# Patient Record
Sex: Female | Born: 1940
Health system: Southern US, Community
[De-identification: ages and names within clinical notes are randomized; demographics above are authoritative.]

## PROBLEM LIST (undated history)

## (undated) DIAGNOSIS — R112 Nausea with vomiting, unspecified: Secondary | ICD-10-CM

## (undated) DIAGNOSIS — H409 Unspecified glaucoma: Secondary | ICD-10-CM

## (undated) DIAGNOSIS — C801 Malignant (primary) neoplasm, unspecified: Secondary | ICD-10-CM

## (undated) DIAGNOSIS — G459 Transient cerebral ischemic attack, unspecified: Secondary | ICD-10-CM

## (undated) DIAGNOSIS — I4811 Longstanding persistent atrial fibrillation: Secondary | ICD-10-CM

## (undated) DIAGNOSIS — Z923 Personal history of irradiation: Secondary | ICD-10-CM

## (undated) DIAGNOSIS — Z9889 Other specified postprocedural states: Secondary | ICD-10-CM

## (undated) DIAGNOSIS — E785 Hyperlipidemia, unspecified: Secondary | ICD-10-CM

## (undated) DIAGNOSIS — I4819 Other persistent atrial fibrillation: Secondary | ICD-10-CM

## (undated) DIAGNOSIS — H269 Unspecified cataract: Secondary | ICD-10-CM

## (undated) HISTORY — DX: Longstanding persistent atrial fibrillation: I48.11

## (undated) HISTORY — PX: BILATERAL SALPINGOOPHORECTOMY: SHX1223

## (undated) HISTORY — PX: BREAST BIOPSY: SHX20

## (undated) HISTORY — DX: Unspecified glaucoma: H40.9

## (undated) HISTORY — DX: Hyperlipidemia, unspecified: E78.5

## (undated) HISTORY — PX: COLONOSCOPY: SHX174

## (undated) HISTORY — PX: ABDOMINAL HYSTERECTOMY: SHX81

## (undated) HISTORY — DX: Unspecified cataract: H26.9

## (undated) HISTORY — DX: Transient cerebral ischemic attack, unspecified: G45.9

---

## 1998-06-03 ENCOUNTER — Ambulatory Visit (HOSPITAL_COMMUNITY): Admission: RE | Admit: 1998-06-03 | Discharge: 1998-06-03 | Payer: Self-pay | Admitting: Obstetrics and Gynecology

## 1999-02-03 ENCOUNTER — Ambulatory Visit (HOSPITAL_COMMUNITY): Admission: RE | Admit: 1999-02-03 | Discharge: 1999-02-03 | Payer: Self-pay | Admitting: Orthopedic Surgery

## 1999-02-03 ENCOUNTER — Encounter: Payer: Self-pay | Admitting: Orthopedic Surgery

## 2000-01-05 ENCOUNTER — Encounter: Payer: Self-pay | Admitting: Obstetrics and Gynecology

## 2000-01-05 ENCOUNTER — Ambulatory Visit (HOSPITAL_COMMUNITY): Admission: RE | Admit: 2000-01-05 | Discharge: 2000-01-05 | Payer: Self-pay | Admitting: Obstetrics and Gynecology

## 2001-01-18 ENCOUNTER — Encounter: Payer: Self-pay | Admitting: Family Medicine

## 2001-01-18 ENCOUNTER — Ambulatory Visit (HOSPITAL_COMMUNITY): Admission: RE | Admit: 2001-01-18 | Discharge: 2001-01-18 | Payer: Self-pay | Admitting: Family Medicine

## 2004-07-15 ENCOUNTER — Encounter: Admission: RE | Admit: 2004-07-15 | Discharge: 2004-07-15 | Payer: Self-pay | Admitting: Family Medicine

## 2005-08-24 ENCOUNTER — Ambulatory Visit: Payer: Self-pay | Admitting: Gastroenterology

## 2005-09-29 ENCOUNTER — Ambulatory Visit: Payer: Self-pay | Admitting: Gastroenterology

## 2008-02-06 ENCOUNTER — Encounter: Admission: RE | Admit: 2008-02-06 | Discharge: 2008-02-06 | Payer: Self-pay | Admitting: Internal Medicine

## 2008-04-20 ENCOUNTER — Emergency Department (HOSPITAL_COMMUNITY): Admission: EM | Admit: 2008-04-20 | Discharge: 2008-04-20 | Payer: Self-pay | Admitting: Emergency Medicine

## 2008-11-25 ENCOUNTER — Encounter: Admission: RE | Admit: 2008-11-25 | Discharge: 2008-11-25 | Payer: Self-pay | Admitting: Internal Medicine

## 2011-06-18 LAB — GLUCOSE, CAPILLARY

## 2011-11-29 ENCOUNTER — Telehealth: Payer: Self-pay | Admitting: Gastroenterology

## 2011-11-29 NOTE — Telephone Encounter (Signed)
Pt states she has had some diarrhea and that she at times has urgency with this and does not quite make it to the restroom. Pt is requesting to be seen sooner than appt with Dr. Arlyce Dice. Pt scheduled to see Willette Cluster NP tomorrow 11/30/11@3 :30pm. Pt aware of appt date and time.

## 2011-11-30 ENCOUNTER — Encounter: Payer: Self-pay | Admitting: Nurse Practitioner

## 2011-11-30 ENCOUNTER — Ambulatory Visit (INDEPENDENT_AMBULATORY_CARE_PROVIDER_SITE_OTHER): Payer: Medicare Other | Admitting: Nurse Practitioner

## 2011-11-30 VITALS — BP 120/72 | HR 60 | Ht 65.0 in | Wt 163.4 lb

## 2011-11-30 DIAGNOSIS — K648 Other hemorrhoids: Secondary | ICD-10-CM | POA: Insufficient documentation

## 2011-11-30 DIAGNOSIS — K625 Hemorrhage of anus and rectum: Secondary | ICD-10-CM

## 2011-11-30 HISTORY — DX: Hemorrhage of anus and rectum: K62.5

## 2011-11-30 HISTORY — DX: Other hemorrhoids: K64.8

## 2011-11-30 MED ORDER — HYDROCORTISONE ACETATE 25 MG RE SUPP: 25.0000 mg | Freq: Two times a day (BID) | RECTAL | Status: AC

## 2011-11-30 NOTE — Patient Instructions (Signed)
We sent the prescription to [facility] at [place]. Make an appointment with Dr. Arlyce Dice for 2 months out.  We have given you a fecal incontinence handout.

## 2011-11-30 NOTE — Progress Notes (Signed)
11/30/2011 Dorothy Herring [id number] 1940/12/24   HISTORY OF PRESENT ILLNESS: Patient is a 71 year old female who had a colonoscopy for hematochezia by Dr. Arlyce Dice in 2007. Findings included internal hemorrhoids only. Patient had a TIA last April and was started on Plavix. She comes in today for evaluation of occasional painless rectal bleeding which has been going on for about three months. She denies constipation. She does complain of fecal seepage over the last few months. She soils her underwear several times a day.    Past Medical History  Diagnosis Date  . Diabetes mellitus   . Hyperlipidemia   . TIA (transient ischemic attack)    Past Surgical History  Procedure Date  . Abdominal hysterectomy   . Breast biopsy     reports that she has never smoked. She has never used smokeless tobacco. She reports that she does not drink alcohol or use illicit drugs. family history includes Lung cancer in her father.  There is no history of Colon cancer. No Known Allergies    Outpatient Encounter Prescriptions as of 11/30/2011  Medication Sig Dispense Refill  . clopidogrel (PLAVIX) 75 MG tablet Take 75 mg by mouth daily.      . insulin aspart (NOVOLOG) 100 UNIT/ML injection Inject 18 Units into the skin daily.      . rosuvastatin (CRESTOR) 20 MG tablet Take 20 mg by mouth daily.      . sitaGLIPtin (JANUVIA) 25 MG tablet Take 25 mg by mouth daily.       REVIEW OF SYSTEMS  : All other systems reviewed and negative except where noted in the History of Present Illness.   PHYSICAL EXAM: BP 120/72  Pulse 60  Ht 5\' 5"  (1.651 m)  Wt 163 lb 6.4 oz (74.118 kg)  BMI 27.19 kg/m2 General: Well developed white female in no acute distress Head: Normocephalic and atraumatic Eyes:  sclerae anicteric,conjunctive pink. Ears: Normal auditory acuity Neck: Supple, no masses.  Lungs: Clear throughout to auscultation Heart: Regular rate and rhythm Abdomen: Soft, non distended, nontender. No masses or  hepatomegaly noted. Normal Bowel sounds Rectal: stool light brown, heme negative. No impaction. Decreased sphincter tone. Internal hemorrhoids seen on anoscopy Musculoskeletal: Symmetrical with no gross deformities  Skin: No lesions on visible extremities Extremities: No edema or deformities noted Neurological: Alert oriented, grossly nonfocal Cervical Nodes:  No significant cervical adenopathy Psychological:  Alert and cooperative. Normal mood and affect  ASSESSMENT AND PLAN;  71 year old female with three month history of occasional rectal bleeding with bowel movements in setting of chronic Plavix. Suspect bleeding from internal hemorrhoids seen on anoscopy today. Will treat her with Anusol-HC suppositories. Followup appointment with Dr. Arlyce Dice in approximately 2 months time. Patient is up-to-date on her colon cancer screening, last colonoscopy was 2007. If bleeding persists patient may need internal hemorrhoid banding but that can be decided at her next visit.

## 2011-12-02 ENCOUNTER — Encounter: Payer: Self-pay | Admitting: Nurse Practitioner

## 2011-12-02 NOTE — Progress Notes (Signed)
i agree with the plan outlined in this note 

## 2011-12-15 ENCOUNTER — Ambulatory Visit: Payer: Self-pay | Admitting: Gastroenterology

## 2012-06-07 ENCOUNTER — Other Ambulatory Visit: Payer: Self-pay | Admitting: Radiology

## 2013-08-01 ENCOUNTER — Ambulatory Visit (INDEPENDENT_AMBULATORY_CARE_PROVIDER_SITE_OTHER): Payer: Medicare Other | Admitting: Podiatry

## 2013-08-01 DIAGNOSIS — M7750 Other enthesopathy of unspecified foot: Secondary | ICD-10-CM

## 2013-08-01 MED ORDER — TRIAMCINOLONE ACETONIDE 10 MG/ML IJ SUSP
5.0000 mg | Freq: Once | INTRAMUSCULAR | Status: AC
  Administered 2013-08-01: 5 mg via INTRA_ARTICULAR

## 2013-08-01 NOTE — Progress Notes (Signed)
Subjective:     Patient ID: Dorothy Herring, female   DOB: [birth year], 72 y.o.   MRN: [id number]  Foot Pain   patient states I am continuing to have pain in his right foot on the side. States she has seen Dr. Jerl Santos and it has continued to hurt her.   Review of Systems     Objective:   Physical Exam  Nursing note and vitals reviewed. Constitutional: She is oriented to person, place, and time.  Cardiovascular: Intact distal pulses.   Musculoskeletal: Normal range of motion.  Neurological: She is oriented to person, place, and time.  Skin: Skin is warm.   patient is found to have pain in the peroneal brevis insertion right foot with no indication of muscle loss or reduced function     Assessment:     Continued peroneal brevis tendinitis right with possibility for bone injury    Plan:     H&P performed and I've recommended orthotics to try to lift the lateral side of the foot. Scanned for customized orthotics today and I did in order to give short-term relief injected the area with 3 mg Kenalog 5 mg Xylocaine Marcaine mixture reappoint when orthotics returned

## 2013-08-23 ENCOUNTER — Ambulatory Visit: Payer: 59 | Admitting: Sports Medicine

## 2015-10-28 ENCOUNTER — Encounter: Payer: Self-pay | Admitting: Gastroenterology

## 2016-01-06 ENCOUNTER — Encounter: Payer: Self-pay | Admitting: Internal Medicine

## 2016-07-06 ENCOUNTER — Other Ambulatory Visit: Payer: Self-pay | Admitting: Internal Medicine

## 2016-07-06 DIAGNOSIS — R011 Cardiac murmur, unspecified: Secondary | ICD-10-CM

## 2016-07-22 ENCOUNTER — Other Ambulatory Visit (HOSPITAL_COMMUNITY): Payer: Medicare Other

## 2016-07-29 ENCOUNTER — Other Ambulatory Visit (HOSPITAL_COMMUNITY): Payer: Medicare Other

## 2016-08-10 ENCOUNTER — Other Ambulatory Visit (HOSPITAL_COMMUNITY): Payer: Medicare Other

## 2016-09-23 DIAGNOSIS — E119 Type 2 diabetes mellitus without complications: Secondary | ICD-10-CM | POA: Diagnosis not present

## 2016-09-23 DIAGNOSIS — H43811 Vitreous degeneration, right eye: Secondary | ICD-10-CM | POA: Diagnosis not present

## 2016-09-23 DIAGNOSIS — H26491 Other secondary cataract, right eye: Secondary | ICD-10-CM | POA: Diagnosis not present

## 2016-09-23 DIAGNOSIS — H401131 Primary open-angle glaucoma, bilateral, mild stage: Secondary | ICD-10-CM | POA: Diagnosis not present

## 2016-11-12 DIAGNOSIS — Z460 Encounter for fitting and adjustment of spectacles and contact lenses: Secondary | ICD-10-CM | POA: Diagnosis not present

## 2016-11-24 ENCOUNTER — Encounter: Payer: Self-pay | Admitting: *Deleted

## 2016-11-29 DIAGNOSIS — R69 Illness, unspecified: Secondary | ICD-10-CM | POA: Diagnosis not present

## 2016-12-07 DIAGNOSIS — E784 Other hyperlipidemia: Secondary | ICD-10-CM | POA: Diagnosis not present

## 2016-12-07 DIAGNOSIS — R8299 Other abnormal findings in urine: Secondary | ICD-10-CM | POA: Diagnosis not present

## 2016-12-07 DIAGNOSIS — I10 Essential (primary) hypertension: Secondary | ICD-10-CM | POA: Diagnosis not present

## 2016-12-07 DIAGNOSIS — E1165 Type 2 diabetes mellitus with hyperglycemia: Secondary | ICD-10-CM | POA: Diagnosis not present

## 2016-12-07 DIAGNOSIS — M859 Disorder of bone density and structure, unspecified: Secondary | ICD-10-CM | POA: Diagnosis not present

## 2016-12-14 DIAGNOSIS — Z794 Long term (current) use of insulin: Secondary | ICD-10-CM | POA: Diagnosis not present

## 2016-12-14 DIAGNOSIS — E1140 Type 2 diabetes mellitus with diabetic neuropathy, unspecified: Secondary | ICD-10-CM | POA: Diagnosis not present

## 2016-12-14 DIAGNOSIS — E1165 Type 2 diabetes mellitus with hyperglycemia: Secondary | ICD-10-CM | POA: Diagnosis not present

## 2016-12-14 DIAGNOSIS — R69 Illness, unspecified: Secondary | ICD-10-CM | POA: Diagnosis not present

## 2016-12-14 DIAGNOSIS — M859 Disorder of bone density and structure, unspecified: Secondary | ICD-10-CM | POA: Diagnosis not present

## 2016-12-14 DIAGNOSIS — R011 Cardiac murmur, unspecified: Secondary | ICD-10-CM | POA: Diagnosis not present

## 2016-12-14 DIAGNOSIS — E784 Other hyperlipidemia: Secondary | ICD-10-CM | POA: Diagnosis not present

## 2016-12-14 DIAGNOSIS — R946 Abnormal results of thyroid function studies: Secondary | ICD-10-CM | POA: Diagnosis not present

## 2016-12-14 DIAGNOSIS — Z0000 Encounter for general adult medical examination without abnormal findings: Secondary | ICD-10-CM | POA: Diagnosis not present

## 2016-12-14 DIAGNOSIS — I10 Essential (primary) hypertension: Secondary | ICD-10-CM | POA: Diagnosis not present

## 2016-12-28 DIAGNOSIS — Z0389 Encounter for observation for other suspected diseases and conditions ruled out: Secondary | ICD-10-CM | POA: Diagnosis not present

## 2017-02-04 DIAGNOSIS — J209 Acute bronchitis, unspecified: Secondary | ICD-10-CM | POA: Diagnosis not present

## 2017-02-04 DIAGNOSIS — Z6826 Body mass index (BMI) 26.0-26.9, adult: Secondary | ICD-10-CM | POA: Diagnosis not present

## 2017-02-24 DIAGNOSIS — L821 Other seborrheic keratosis: Secondary | ICD-10-CM | POA: Diagnosis not present

## 2017-02-24 DIAGNOSIS — Z85828 Personal history of other malignant neoplasm of skin: Secondary | ICD-10-CM | POA: Diagnosis not present

## 2017-02-24 DIAGNOSIS — D1801 Hemangioma of skin and subcutaneous tissue: Secondary | ICD-10-CM | POA: Diagnosis not present

## 2017-02-24 DIAGNOSIS — L814 Other melanin hyperpigmentation: Secondary | ICD-10-CM | POA: Diagnosis not present

## 2017-02-24 DIAGNOSIS — H0014 Chalazion left upper eyelid: Secondary | ICD-10-CM | POA: Diagnosis not present

## 2017-03-09 DIAGNOSIS — H401131 Primary open-angle glaucoma, bilateral, mild stage: Secondary | ICD-10-CM | POA: Diagnosis not present

## 2017-04-08 DIAGNOSIS — I10 Essential (primary) hypertension: Secondary | ICD-10-CM | POA: Diagnosis not present

## 2017-04-08 DIAGNOSIS — E1165 Type 2 diabetes mellitus with hyperglycemia: Secondary | ICD-10-CM | POA: Diagnosis not present

## 2017-04-08 DIAGNOSIS — G6289 Other specified polyneuropathies: Secondary | ICD-10-CM | POA: Diagnosis not present

## 2017-04-08 DIAGNOSIS — Z794 Long term (current) use of insulin: Secondary | ICD-10-CM | POA: Diagnosis not present

## 2017-04-08 DIAGNOSIS — E559 Vitamin D deficiency, unspecified: Secondary | ICD-10-CM | POA: Diagnosis not present

## 2017-04-08 DIAGNOSIS — Z6827 Body mass index (BMI) 27.0-27.9, adult: Secondary | ICD-10-CM | POA: Diagnosis not present

## 2017-04-08 DIAGNOSIS — H4089 Other specified glaucoma: Secondary | ICD-10-CM | POA: Diagnosis not present

## 2017-04-08 DIAGNOSIS — E1140 Type 2 diabetes mellitus with diabetic neuropathy, unspecified: Secondary | ICD-10-CM | POA: Diagnosis not present

## 2017-05-31 DIAGNOSIS — R69 Illness, unspecified: Secondary | ICD-10-CM | POA: Diagnosis not present

## 2017-06-20 ENCOUNTER — Other Ambulatory Visit: Payer: Self-pay | Admitting: Internal Medicine

## 2017-06-20 DIAGNOSIS — Z23 Encounter for immunization: Secondary | ICD-10-CM | POA: Diagnosis not present

## 2017-06-20 DIAGNOSIS — R591 Generalized enlarged lymph nodes: Secondary | ICD-10-CM

## 2017-06-20 DIAGNOSIS — R590 Localized enlarged lymph nodes: Secondary | ICD-10-CM

## 2017-06-20 DIAGNOSIS — R200 Anesthesia of skin: Secondary | ICD-10-CM | POA: Diagnosis not present

## 2017-06-22 ENCOUNTER — Ambulatory Visit
Admission: RE | Admit: 2017-06-22 | Discharge: 2017-06-22 | Disposition: A | Discharge status: Home / Self Care | Payer: Medicare HMO | Source: Ambulatory Visit | Attending: Internal Medicine | Admitting: Internal Medicine

## 2017-06-22 DIAGNOSIS — R591 Generalized enlarged lymph nodes: Secondary | ICD-10-CM

## 2017-06-22 DIAGNOSIS — E010 Iodine-deficiency related diffuse (endemic) goiter: Secondary | ICD-10-CM | POA: Diagnosis not present

## 2017-06-22 MED ORDER — IOPAMIDOL (ISOVUE-300) INJECTION 61%
75.0000 mL | Freq: Once | INTRAVENOUS | Status: AC | PRN
  Administered 2017-06-22: 75 mL via INTRAVENOUS

## 2017-06-23 ENCOUNTER — Other Ambulatory Visit: Payer: Medicare Other

## 2017-06-28 DIAGNOSIS — R928 Other abnormal and inconclusive findings on diagnostic imaging of breast: Secondary | ICD-10-CM | POA: Diagnosis not present

## 2017-06-30 DIAGNOSIS — E113292 Type 2 diabetes mellitus with mild nonproliferative diabetic retinopathy without macular edema, left eye: Secondary | ICD-10-CM | POA: Diagnosis not present

## 2017-06-30 DIAGNOSIS — H401131 Primary open-angle glaucoma, bilateral, mild stage: Secondary | ICD-10-CM | POA: Diagnosis not present

## 2017-07-01 DIAGNOSIS — H401131 Primary open-angle glaucoma, bilateral, mild stage: Secondary | ICD-10-CM | POA: Diagnosis not present

## 2017-07-07 DIAGNOSIS — L570 Actinic keratosis: Secondary | ICD-10-CM | POA: Diagnosis not present

## 2017-07-07 DIAGNOSIS — D485 Neoplasm of uncertain behavior of skin: Secondary | ICD-10-CM | POA: Diagnosis not present

## 2017-07-07 DIAGNOSIS — Z85828 Personal history of other malignant neoplasm of skin: Secondary | ICD-10-CM | POA: Diagnosis not present

## 2017-08-02 DIAGNOSIS — M25552 Pain in left hip: Secondary | ICD-10-CM | POA: Diagnosis not present

## 2017-08-02 DIAGNOSIS — M545 Low back pain: Secondary | ICD-10-CM | POA: Diagnosis not present

## 2017-08-08 DIAGNOSIS — I10 Essential (primary) hypertension: Secondary | ICD-10-CM | POA: Diagnosis not present

## 2017-08-08 DIAGNOSIS — Z794 Long term (current) use of insulin: Secondary | ICD-10-CM | POA: Diagnosis not present

## 2017-08-08 DIAGNOSIS — E1165 Type 2 diabetes mellitus with hyperglycemia: Secondary | ICD-10-CM | POA: Diagnosis not present

## 2017-08-08 DIAGNOSIS — Z6831 Body mass index (BMI) 31.0-31.9, adult: Secondary | ICD-10-CM | POA: Diagnosis not present

## 2017-09-09 DIAGNOSIS — M542 Cervicalgia: Secondary | ICD-10-CM | POA: Diagnosis not present

## 2017-09-14 DIAGNOSIS — M62838 Other muscle spasm: Secondary | ICD-10-CM | POA: Diagnosis not present

## 2017-09-14 DIAGNOSIS — M47892 Other spondylosis, cervical region: Secondary | ICD-10-CM | POA: Diagnosis not present

## 2017-09-14 DIAGNOSIS — M542 Cervicalgia: Secondary | ICD-10-CM | POA: Diagnosis not present

## 2017-09-15 DIAGNOSIS — M542 Cervicalgia: Secondary | ICD-10-CM | POA: Diagnosis not present

## 2017-09-20 DIAGNOSIS — C801 Malignant (primary) neoplasm, unspecified: Secondary | ICD-10-CM

## 2017-09-20 HISTORY — DX: Malignant (primary) neoplasm, unspecified: C80.1

## 2017-09-28 DIAGNOSIS — M542 Cervicalgia: Secondary | ICD-10-CM | POA: Diagnosis not present

## 2017-09-29 DIAGNOSIS — H43811 Vitreous degeneration, right eye: Secondary | ICD-10-CM | POA: Diagnosis not present

## 2017-09-29 DIAGNOSIS — E119 Type 2 diabetes mellitus without complications: Secondary | ICD-10-CM | POA: Diagnosis not present

## 2017-09-29 DIAGNOSIS — H401131 Primary open-angle glaucoma, bilateral, mild stage: Secondary | ICD-10-CM | POA: Diagnosis not present

## 2017-09-29 DIAGNOSIS — H26491 Other secondary cataract, right eye: Secondary | ICD-10-CM | POA: Diagnosis not present

## 2017-10-11 DIAGNOSIS — M542 Cervicalgia: Secondary | ICD-10-CM | POA: Diagnosis not present

## 2017-10-14 DIAGNOSIS — M542 Cervicalgia: Secondary | ICD-10-CM | POA: Diagnosis not present

## 2017-11-30 DIAGNOSIS — R69 Illness, unspecified: Secondary | ICD-10-CM | POA: Diagnosis not present

## 2017-12-12 DIAGNOSIS — E559 Vitamin D deficiency, unspecified: Secondary | ICD-10-CM | POA: Diagnosis not present

## 2017-12-12 DIAGNOSIS — E7849 Other hyperlipidemia: Secondary | ICD-10-CM | POA: Diagnosis not present

## 2017-12-12 DIAGNOSIS — E1165 Type 2 diabetes mellitus with hyperglycemia: Secondary | ICD-10-CM | POA: Diagnosis not present

## 2017-12-12 DIAGNOSIS — R946 Abnormal results of thyroid function studies: Secondary | ICD-10-CM | POA: Diagnosis not present

## 2017-12-19 DIAGNOSIS — Z0000 Encounter for general adult medical examination without abnormal findings: Secondary | ICD-10-CM | POA: Diagnosis not present

## 2017-12-19 DIAGNOSIS — E7849 Other hyperlipidemia: Secondary | ICD-10-CM | POA: Diagnosis not present

## 2017-12-19 DIAGNOSIS — E11319 Type 2 diabetes mellitus with unspecified diabetic retinopathy without macular edema: Secondary | ICD-10-CM | POA: Diagnosis not present

## 2017-12-19 DIAGNOSIS — I10 Essential (primary) hypertension: Secondary | ICD-10-CM | POA: Diagnosis not present

## 2017-12-19 DIAGNOSIS — Z794 Long term (current) use of insulin: Secondary | ICD-10-CM | POA: Diagnosis not present

## 2017-12-19 DIAGNOSIS — E1169 Type 2 diabetes mellitus with other specified complication: Secondary | ICD-10-CM | POA: Diagnosis not present

## 2017-12-19 DIAGNOSIS — R591 Generalized enlarged lymph nodes: Secondary | ICD-10-CM | POA: Diagnosis not present

## 2017-12-19 DIAGNOSIS — G6289 Other specified polyneuropathies: Secondary | ICD-10-CM | POA: Diagnosis not present

## 2017-12-19 DIAGNOSIS — E1140 Type 2 diabetes mellitus with diabetic neuropathy, unspecified: Secondary | ICD-10-CM | POA: Diagnosis not present

## 2017-12-19 DIAGNOSIS — R69 Illness, unspecified: Secondary | ICD-10-CM | POA: Diagnosis not present

## 2018-01-30 DIAGNOSIS — Z6826 Body mass index (BMI) 26.0-26.9, adult: Secondary | ICD-10-CM | POA: Diagnosis not present

## 2018-01-30 DIAGNOSIS — N6452 Nipple discharge: Secondary | ICD-10-CM | POA: Diagnosis not present

## 2018-01-30 DIAGNOSIS — E1165 Type 2 diabetes mellitus with hyperglycemia: Secondary | ICD-10-CM | POA: Diagnosis not present

## 2018-01-30 DIAGNOSIS — B373 Candidiasis of vulva and vagina: Secondary | ICD-10-CM | POA: Diagnosis not present

## 2018-01-31 DIAGNOSIS — N6322 Unspecified lump in the left breast, upper inner quadrant: Secondary | ICD-10-CM | POA: Diagnosis not present

## 2018-01-31 DIAGNOSIS — N6452 Nipple discharge: Secondary | ICD-10-CM | POA: Diagnosis not present

## 2018-01-31 DIAGNOSIS — N6321 Unspecified lump in the left breast, upper outer quadrant: Secondary | ICD-10-CM | POA: Diagnosis not present

## 2018-02-01 ENCOUNTER — Other Ambulatory Visit: Payer: Self-pay | Admitting: Radiology

## 2018-02-01 DIAGNOSIS — D0592 Unspecified type of carcinoma in situ of left breast: Secondary | ICD-10-CM | POA: Diagnosis not present

## 2018-02-01 DIAGNOSIS — D0512 Intraductal carcinoma in situ of left breast: Secondary | ICD-10-CM | POA: Diagnosis not present

## 2018-02-02 ENCOUNTER — Telehealth: Payer: Self-pay | Admitting: *Deleted

## 2018-02-02 DIAGNOSIS — C50912 Malignant neoplasm of unspecified site of left female breast: Secondary | ICD-10-CM | POA: Diagnosis not present

## 2018-02-02 NOTE — Telephone Encounter (Signed)
error 

## 2018-02-03 ENCOUNTER — Telehealth: Payer: Self-pay | Admitting: Hematology and Oncology

## 2018-02-03 NOTE — Telephone Encounter (Signed)
LVM on mobile and home for in reference to the morning [facility] appointment on 5/22, letter mailed to patient packet sent by [facility]

## 2018-02-06 ENCOUNTER — Encounter: Payer: Self-pay | Admitting: Genetics

## 2018-02-06 ENCOUNTER — Telehealth: Payer: Self-pay | Admitting: Hematology and Oncology

## 2018-02-06 ENCOUNTER — Encounter: Payer: Self-pay | Admitting: *Deleted

## 2018-02-06 DIAGNOSIS — D0512 Intraductal carcinoma in situ of left breast: Secondary | ICD-10-CM | POA: Insufficient documentation

## 2018-02-06 HISTORY — DX: Intraductal carcinoma in situ of left breast: D05.12

## 2018-02-06 NOTE — Telephone Encounter (Signed)
Spoke with patient to confirm morning [facility] appointment on 5/22, [facility] patient no packet sent

## 2018-02-08 ENCOUNTER — Encounter: Payer: Self-pay | Admitting: Radiation Oncology

## 2018-02-08 ENCOUNTER — Ambulatory Visit: Payer: Medicare HMO | Admitting: Physical Therapy

## 2018-02-08 ENCOUNTER — Encounter: Payer: Self-pay | Admitting: *Deleted

## 2018-02-08 ENCOUNTER — Ambulatory Visit
Admission: RE | Admit: 2018-02-08 | Discharge: 2018-02-08 | Disposition: A | Discharge status: Home / Self Care | Payer: Medicare HMO | Source: Ambulatory Visit | Attending: Radiation Oncology | Admitting: Radiation Oncology

## 2018-02-08 ENCOUNTER — Inpatient Hospital Stay: Payer: Medicare HMO

## 2018-02-08 ENCOUNTER — Encounter: Payer: Self-pay | Admitting: Hematology and Oncology

## 2018-02-08 ENCOUNTER — Ambulatory Visit: Payer: Self-pay | Admitting: Surgery

## 2018-02-08 ENCOUNTER — Inpatient Hospital Stay: Payer: Medicare HMO | Attending: Hematology and Oncology | Admitting: Hematology and Oncology

## 2018-02-08 DIAGNOSIS — I499 Cardiac arrhythmia, unspecified: Secondary | ICD-10-CM | POA: Diagnosis not present

## 2018-02-08 DIAGNOSIS — D0592 Unspecified type of carcinoma in situ of left breast: Secondary | ICD-10-CM | POA: Diagnosis not present

## 2018-02-08 DIAGNOSIS — I10 Essential (primary) hypertension: Secondary | ICD-10-CM | POA: Diagnosis not present

## 2018-02-08 DIAGNOSIS — E119 Type 2 diabetes mellitus without complications: Secondary | ICD-10-CM | POA: Diagnosis not present

## 2018-02-08 DIAGNOSIS — I4892 Unspecified atrial flutter: Secondary | ICD-10-CM | POA: Diagnosis not present

## 2018-02-08 DIAGNOSIS — E1169 Type 2 diabetes mellitus with other specified complication: Secondary | ICD-10-CM | POA: Diagnosis not present

## 2018-02-08 DIAGNOSIS — Z8673 Personal history of transient ischemic attack (TIA), and cerebral infarction without residual deficits: Secondary | ICD-10-CM | POA: Diagnosis not present

## 2018-02-08 DIAGNOSIS — Z170 Estrogen receptor positive status [ER+]: Secondary | ICD-10-CM | POA: Diagnosis not present

## 2018-02-08 DIAGNOSIS — D0512 Intraductal carcinoma in situ of left breast: Secondary | ICD-10-CM

## 2018-02-08 DIAGNOSIS — Z7901 Long term (current) use of anticoagulants: Secondary | ICD-10-CM | POA: Diagnosis not present

## 2018-02-08 DIAGNOSIS — Z6827 Body mass index (BMI) 27.0-27.9, adult: Secondary | ICD-10-CM | POA: Diagnosis not present

## 2018-02-08 LAB — CBC WITH DIFFERENTIAL (CANCER CENTER ONLY)
Basophils Absolute: 0 10*3/uL (ref 0.0–0.1)
Basophils Relative: 0 %
Eosinophils Absolute: 0.1 10*3/uL (ref 0.0–0.5)
Eosinophils Relative: 2 %
HEMATOCRIT: 39.8 % (ref 34.8–46.6)
Hemoglobin: 13.1 g/dL (ref 11.6–15.9)
LYMPHS ABS: 1.7 10*3/uL (ref 0.9–3.3)
LYMPHS PCT: 25 %
MCH: 28.4 pg (ref 25.1–34.0)
MCHC: 32.9 g/dL (ref 31.5–36.0)
MCV: 86.1 fL (ref 79.5–101.0)
MONO ABS: 0.5 10*3/uL (ref 0.1–0.9)
MONOS PCT: 7 %
NEUTROS ABS: 4.3 10*3/uL (ref 1.5–6.5)
Neutrophils Relative %: 66 %
Platelet Count: 179 10*3/uL (ref 145–400)
RBC: 4.62 MIL/uL (ref 3.70–5.45)
RDW: 13.6 % (ref 11.2–14.5)
WBC Count: 6.7 10*3/uL (ref 3.9–10.3)

## 2018-02-08 LAB — CMP (CANCER CENTER ONLY)
ALBUMIN: 4 g/dL (ref 3.5–5.0)
ALT: 24 U/L (ref 0–55)
ANION GAP: 9 (ref 3–11)
AST: 22 U/L (ref 5–34)
Alkaline Phosphatase: 47 U/L (ref 40–150)
BUN: 21 mg/dL (ref 7–26)
CO2: 26 mmol/L (ref 22–29)
Calcium: 9.5 mg/dL (ref 8.4–10.4)
Chloride: 105 mmol/L (ref 98–109)
Creatinine: 0.9 mg/dL (ref 0.60–1.10)
GFR, Est AFR Am: >60 mL/min (ref >60)
GFR, Estimated: >60 mL/min (ref >60)
GLUCOSE: 193 mg/dL — AB (ref 70–140)
POTASSIUM: 4 mmol/L (ref 3.5–5.1)
SODIUM: 140 mmol/L (ref 136–145)
Total Bilirubin: 0.5 mg/dL (ref 0.2–1.2)
Total Protein: 6.8 g/dL (ref 6.4–8.3)

## 2018-02-08 NOTE — Progress Notes (Signed)
Radiation Oncology         [phone] ________________________________  Initial Outpatient Consultation  Name: Dorothy Herring MRN: [id number]  Date: 02/08/2018  DOB: December 16, 1940  RX:[id number], Dorothy Reichmann, MD  Dorothy Overall, MD   REFERRING PHYSICIAN: Alphonsa Overall, MD  DIAGNOSIS:    ICD-10-CM   1. Ductal carcinoma in situ (DCIS) of left breast D05.12    Stage 0, cTisN0 Left Breast central Ductal Carcinoma In Situ, ER(+) / PR(+), Intermediate Grade  CHIEF COMPLAINT: Here to discuss management of left breast DCIS  HISTORY OF PRESENT ILLNESS::Dorothy Herring is a 77 y.o. female with a history of TIA on Plavix who presented with bloody nipple discharge in the left breast. Diagnostic mammogram of the left breast on 01/31/18 showed a 3 cm global asymmetry in the upper inner quadrant posterior depth. This area of the breast was normal on ultrasound, but there was a 0.5 cm irregular mass in the left breast at 12:00 anterior depth 1 cm from the nipple. The left axilla was negative. Biopsy of the mass on 02/01/18 revealed DCIS with characteristics as described above in the diagnosis.  On review of systems, the patient is positive for nipple discharge. She is positive for diabetes and hot flashes.   GYN/OB History: She had her first menstrual period at age 72. She is postmenopausal. She has used hormone replacement for the past 15 years but stopped 2 weeks ago. She has carried 1 child to term at age 35. She has never used birth control pills or hormone shots for contraception.  PREVIOUS RADIATION THERAPY: No  PAST MEDICAL HISTORY:  has a past medical history of Diabetes mellitus, Hyperlipidemia, and TIA (transient ischemic attack).    PAST SURGICAL HISTORY: Past Surgical History:  Procedure Laterality Date  . ABDOMINAL HYSTERECTOMY    . BREAST BIOPSY      FAMILY HISTORY: family history includes Lung cancer in her father.  SOCIAL HISTORY:  reports that she has never smoked. She has never used  smokeless tobacco. She reports that she does not drink alcohol or use drugs. Widowed. 1 daughter. Retired.   ALLERGIES: Patient has no known allergies.  MEDICATIONS:  Current Outpatient Medications  Medication Sig Dispense Refill  . clopidogrel (PLAVIX) 75 MG tablet Take 75 mg by mouth daily.    . empagliflozin (JARDIANCE) 10 MG TABS tablet Take 10 mg by mouth daily.    Marland Kitchen estradiol (VIVELLE-DOT) 0.1 MG/24HR patch Place 1 patch onto the skin 2 (two) times a week.    . gabapentin (NEURONTIN) 300 MG capsule Take 300 mg by mouth 2 (two) times daily.    . rosuvastatin (CRESTOR) 20 MG tablet Take 40 mg by mouth daily.      No current facility-administered medications for this encounter.     REVIEW OF SYSTEMS: A 10+ POINT REVIEW OF SYSTEMS WAS OBTAINED including neurology, dermatology, psychiatry, cardiac, respiratory, lymph, extremities, GI, GU, Musculoskeletal, constitutional, breasts, reproductive, HEENT.  All pertinent positives are noted in the HPI.  All others are negative.   PHYSICAL EXAM:  Vitals with BMI 02/08/2018  Height 5\' 5"   Weight 155 lbs 11 oz  BMI 67.61  Systolic 950  Diastolic 53  Pulse 80  Respirations 17   General: Alert and oriented, in no acute distress. HEENT: Head is normocephalic. Extraocular movements are intact. Oropharynx is clear. She has partials in place. Neck: Neck is supple, no palpable cervical or supraclavicular lymphadenopathy. Heart: Regular in rate. Irregular rhythm. No murmurs. Chest: Clear to auscultation bilaterally,  with no rhonchi, wheezes, or rales. Abdomen: Soft, nontender, nondistended, with no rigidity or guarding. Extremities: No cyanosis or edema. Lymphatics: see Neck Exam Skin: No concerning lesions. Musculoskeletal: Symmetric strength and muscle tone throughout. Neurologic: Cranial nerves II through XII are grossly intact. No obvious focalities. Speech is fluent. Coordination is intact. Psychiatric: Judgment and insight are intact.  Affect is appropriate. Breasts: Left breast shows significant bruising related to biopsy. No palpable masses.    ECOG = 0  0 - Asymptomatic (Fully active, able to carry on all predisease activities without restriction)  1 - Symptomatic but completely ambulatory (Restricted in physically strenuous activity but ambulatory and able to carry out work of a light or sedentary nature. For example, light housework, office work)  2 - Symptomatic, <50% in bed during the day (Ambulatory and capable of all self care but unable to carry out any work activities. Up and about more than 50% of waking hours)  3 - Symptomatic, >50% in bed, but not bedbound (Capable of only limited self-care, confined to bed or chair 50% or more of waking hours)  4 - Bedbound (Completely disabled. Cannot carry on any self-care. Totally confined to bed or chair)  5 - Death   Eustace Pen MM, Creech RH, Tormey DC, et al. [phone]). "Toxicity and response criteria of the [facility]". [place] Oncol. 5 (6): 649-55   LABORATORY DATA:  Lab Results  Component Value Date   WBC 6.7 02/08/2018   HGB 13.1 02/08/2018   HCT 39.8 02/08/2018   MCV 86.1 02/08/2018   PLT 179 02/08/2018   CMP     Component Value Date/Time   NA 140 02/08/2018 0813   K 4.0 02/08/2018 0813   CL 105 02/08/2018 0813   CO2 26 02/08/2018 0813   GLUCOSE 193 (H) 02/08/2018 0813   BUN 21 02/08/2018 0813   CREATININE 0.90 02/08/2018 0813   CALCIUM 9.5 02/08/2018 0813   PROT 6.8 02/08/2018 0813   ALBUMIN 4.0 02/08/2018 0813   AST 22 02/08/2018 0813   ALT 24 02/08/2018 0813   ALKPHOS 47 02/08/2018 0813   BILITOT 0.5 02/08/2018 0813   GFRNONAA >60 02/08/2018 0813   GFRAA >60 02/08/2018 0813         RADIOGRAPHY: In HPI.    IMPRESSION/PLAN: Left Breast DCIS  She is enthusiastic about breast conservation and will undergo left breast lumpectomy.   It was a pleasure meeting the patient today. We discussed the risks, benefits, and  side effects of radiotherapy. I recommend radiotherapy to the left breast to reduce her risk of locoregional recurrence by 1/2.  We discussed that radiation would take approximately 4 weeks to complete and that I would give the patient a few weeks to heal following surgery before starting treatment planning. We spoke about acute effects including skin irritation and fatigue as well as much less common late effects including internal organ injury or irritation. We spoke about the latest technology that is used to minimize the risk of late effects for patients undergoing radiotherapy to the breast or chest wall. No guarantees of treatment were given. The patient is enthusiastic about proceeding with treatment. I look forward to participating in the patient's care.  I will await her referral back to me for postoperative follow-up and eventual CT simulation/treatment planning.  She is also enthusiastic about taking anti-estrogens after radiotherapy and will see Dr. Lindi Adie for this.   Irregular rhythm heard on heart exam today: She will undergo EKG pre-operatively. Dr.  Lucia Gaskins is aware.   __________________________________________   Eppie Gibson, MD  This document serves as a record of services personally performed by Eppie Gibson, MD. It was created on her behalf by Rae Lips, a trained medical scribe. The creation of this record is based on the scribe's personal observations and the provider's statements to them. This document has been checked and approved by the attending provider.

## 2018-02-08 NOTE — Progress Notes (Signed)
Clinical Social Work [place] Psychosocial Distress Screening [place]  Patient completed distress screening protocol and scored a 3 on the Psychosocial Distress Thermometer which indicates mild distress. Clinical Social Worker met with patient and patients family in [facility] to assess for distress and other psychosocial needs. Patient stated she was feeling overwhelmed but felt "better" after meeting with the treatment team and getting more information on her treatment plan. CSW and patient discussed common feeling and emotions when being diagnosed with cancer, and the importance of support during treatment. CSW informed patient of the support team and support services at [facility]. CSW provided contact information and encouraged patient to call with any questions or concerns.  ONCBCN DISTRESS SCREENING 02/08/2018  Screening Type Initial Screening  Distress experienced in past week (1-10) 3  Information Concerns Type Lack of info about treatment;Lack of info about diagnosis     Johnnye Lana, MSW, LCSW, OSW-C Clinical Social Worker [place] [phone]

## 2018-02-08 NOTE — Progress Notes (Signed)
Nutrition Assessment  Reason for Assessment:  Pt seen in Breast Clinic  ASSESSMENT:   77 year old female with new diagnosis of breast cancer.  Past medical history of TIA, DM.    Patient reports normal appetite  Medications:  reviewed  Labs: reviewed  Anthropometrics:   Height: 65 inches Weight: 155 lb 11.2 oz BMI: 25   NUTRITION DIAGNOSIS: Food and nutrition related knowledge deficit related to new diagnosis of breast cancer as evidenced by no prior need for nutrition related information.  INTERVENTION:   Discussed and provided packet of information regarding nutritional tips for breast cancer patients.  Questions answered.  Teachback method used.  Contact information provided and patient knows to contact me with questions/concerns.    MONITORING, EVALUATION, and GOAL: Pt will consume a healthy plant based diet to maintain lean body mass throughout treatment.   Dorothy Herring, [place], [place] Registered Dietitian [phone] (pager)

## 2018-02-08 NOTE — Progress Notes (Signed)
[facility] NOTE  Patient Care Team: Shon Baton, MD as PCP - General (Internal Medicine) Alphonsa Overall, MD as Consulting Physician (General Surgery) Nicholas Lose, MD as Consulting Physician (Hematology and Oncology) Eppie Gibson, MD as Attending Physician (Radiation Oncology)  CHIEF COMPLAINTS/PURPOSE OF CONSULTATION:  Newly diagnosed breast cancer  HISTORY OF PRESENTING ILLNESS:  Dorothy Herring 77 y.o. female is here because of recent diagnosis of left breast DCIS.  Patient had nipple discharge she underwent evaluation.  Mammogram revealed an asymmetry in the left breast.  Ultrasound revealed 0.5 cm area of concern at 12 o'clock position 1 cm from nipple.  Ultrasound axilla was negative.  Biopsy of this area revealed intermediate grade DCIS that was ER PR positive.  She was presented this morning in the multidisciplinary tumor board and she is here today at the [facility] clinic to discuss her treatment plan.  I reviewed her records extensively and collaborated the history with the patient.  SUMMARY OF ONCOLOGIC HISTORY:   Ductal carcinoma in situ (DCIS) of left breast   02/01/2018 Initial Diagnosis    Left nipple discharge, mammogram showing asymmetry, ultrasound reveals 0.5 cm area of concern at 12 o'clock position 1 cm from the nipple, axilla negative, biopsy revealed intermediate grade DCIS ER 100%, PR 100%, Tis N0 stage 0 clinical stage      MEDICAL HISTORY:  Past Medical History:  Diagnosis Date  . Diabetes mellitus   . Hyperlipidemia   . TIA (transient ischemic attack)     SURGICAL HISTORY: Past Surgical History:  Procedure Laterality Date  . ABDOMINAL HYSTERECTOMY    . BREAST BIOPSY      SOCIAL HISTORY: Social History   Socioeconomic History  . Marital status: Widowed    Spouse name: Not on file  . Number of children: 1  . Years of education: Not on file  . Highest education level: Not on file  Occupational History  . Occupation: Housewife   Social Needs  . Financial resource strain: Not on file  . Food insecurity:    Worry: Not on file    Inability: Not on file  . Transportation needs:    Medical: Not on file    Non-medical: Not on file  Tobacco Use  . Smoking status: Never Smoker  . Smokeless tobacco: Never Used  Substance and Sexual Activity  . Alcohol use: No  . Drug use: No  . Sexual activity: Not on file  Lifestyle  . Physical activity:    Days per week: Not on file    Minutes per session: Not on file  . Stress: Not on file  Relationships  . Social connections:    Talks on phone: Not on file    Gets together: Not on file    Attends religious service: Not on file    Active member of club or organization: Not on file    Attends meetings of clubs or organizations: Not on file    Relationship status: Not on file  . Intimate partner violence:    Fear of current or ex partner: Not on file    Emotionally abused: Not on file    Physically abused: Not on file    Forced sexual activity: Not on file  Other Topics Concern  . Not on file  Social History Narrative  . Not on file    FAMILY HISTORY: Family History  Problem Relation Age of Onset  . Lung cancer Father   . Colon cancer Neg Hx  ALLERGIES:  has No Known Allergies.  MEDICATIONS:  Current Outpatient Medications  Medication Sig Dispense Refill  . clopidogrel (PLAVIX) 75 MG tablet Take 75 mg by mouth daily.    . empagliflozin (JARDIANCE) 10 MG TABS tablet Take 10 mg by mouth daily.    Marland Kitchen estradiol (VIVELLE-DOT) 0.1 MG/24HR patch Place 1 patch onto the skin 2 (two) times a week.    . gabapentin (NEURONTIN) 300 MG capsule Take 300 mg by mouth 2 (two) times daily.    . rosuvastatin (CRESTOR) 20 MG tablet Take 40 mg by mouth daily.      No current facility-administered medications for this visit.     REVIEW OF SYSTEMS:   Constitutional: Denies fevers, chills or abnormal night sweats Eyes: Denies blurriness of vision, double vision or watery  eyes Ears, nose, mouth, throat, and face: Denies mucositis or sore throat Respiratory: Denies cough, dyspnea or wheezes Cardiovascular: Denies palpitation, chest discomfort or lower extremity swelling Gastrointestinal:  Denies nausea, heartburn or change in bowel habits Skin: Denies abnormal skin rashes Lymphatics: Denies new lymphadenopathy or easy bruising Neurological:Denies numbness, tingling or new weaknesses Behavioral/Psych: Mood is stable, no new changes  Breast: Nipple discharge All other systems were reviewed with the patient and are negative.  PHYSICAL EXAMINATION: ECOG PERFORMANCE STATUS: 1 - Symptomatic but completely ambulatory  Vitals:   02/08/18 0853  BP: (!) 125/53  Pulse: 80  Resp: 17  Temp: (!) 97.5 F (36.4 C)  SpO2: 100%   Filed Weights   02/08/18 0853  Weight: 155 lb 11.2 oz (70.6 kg)    GENERAL:alert, no distress and comfortable SKIN: skin color, texture, turgor are normal, no rashes or significant lesions EYES: normal, conjunctiva are pink and non-injected, sclera clear OROPHARYNX:no exudate, no erythema and lips, buccal mucosa, and tongue normal  NECK: supple, thyroid normal size, non-tender, without nodularity LYMPH:  no palpable lymphadenopathy in the cervical, axillary or inguinal LUNGS: clear to auscultation and percussion with normal breathing effort HEART: regular rate & rhythm and no murmurs and no lower extremity edema ABDOMEN:abdomen soft, non-tender and normal bowel sounds Musculoskeletal:no cyanosis of digits and no clubbing  PSYCH: alert & oriented x 3 with fluent speech NEURO: no focal motor/sensory deficits BREAST no palpable nodules in breast. No palpable axillary or supraclavicular lymphadenopathy (exam performed in the presence of a chaperone)   LABORATORY DATA:  I have reviewed the data as listed Lab Results  Component Value Date   WBC 6.7 02/08/2018   HGB 13.1 02/08/2018   HCT 39.8 02/08/2018   MCV 86.1 02/08/2018   PLT  179 02/08/2018   Lab Results  Component Value Date   NA 140 02/08/2018   K 4.0 02/08/2018   CL 105 02/08/2018   CO2 26 02/08/2018    RADIOGRAPHIC STUDIES: I have personally reviewed the radiological reports and agreed with the findings in the report.  ASSESSMENT AND PLAN:  Ductal carcinoma in situ (DCIS) of left breast 02/01/2018:Left nipple discharge, mammogram showing asymmetry, ultrasound reveals 0.5 cm area of concern at 12 o'clock position 1 cm from the nipple, axilla negative, biopsy revealed intermediate grade DCIS ER 100%, PR 100%, Tis N0 stage 0 clinical stage  Pathology review: I discussed with the patient the difference between DCIS and invasive breast cancer. It is considered a precancerous lesion. DCIS is classified as a 0. It is generally detected through mammograms as calcifications. We discussed the significance of grades and its impact on prognosis. We also discussed the importance of ER and  PR receptors and their implications to adjuvant treatment options. Prognosis of DCIS dependence on grade, comedo necrosis. It is anticipated that if not treated, 20-30% of DCIS can develop into invasive breast cancer.  Recommendation: 1. Breast conserving surgery 2. Followed by adjuvant radiation therapy 3. Followed by antiestrogen therapy with Anastrozole 5 years  Anastrozole counseling: We discussed the risks and benefits of anti-estrogen therapy with aromatase inhibitors. These include but not limited to insomnia, hot flashes, mood changes, vaginal dryness, bone density loss, and weight gain. We strongly believe that the benefits far outweigh the risks. Patient understands these risks and consented to starting treatment. Planned treatment duration is 5 years.   Return to clinic after surgery to discuss the final pathology report and come up with an adjuvant treatment plan.  All questions were answered. The patient knows to call the clinic with any problems, questions or  concerns.    Harriette Ohara, MD 02/08/18

## 2018-02-08 NOTE — Assessment & Plan Note (Signed)
02/01/2018:Left nipple discharge, mammogram showing asymmetry, ultrasound reveals 0.5 cm area of concern at 12 o'clock position 1 cm from the nipple, axilla negative, biopsy revealed intermediate grade DCIS ER 100%, PR 100%, Tis N0 stage 0 clinical stage  Pathology review: I discussed with the patient the difference between DCIS and invasive breast cancer. It is considered a precancerous lesion. DCIS is classified as a 0. It is generally detected through mammograms as calcifications. We discussed the significance of grades and its impact on prognosis. We also discussed the importance of ER and PR receptors and their implications to adjuvant treatment options. Prognosis of DCIS dependence on grade, comedo necrosis. It is anticipated that if not treated, 20-30% of DCIS can develop into invasive breast cancer.  Recommendation: 1. Breast conserving surgery 2. Followed by adjuvant radiation therapy 3. Followed by antiestrogen therapy with tamoxifen 5 years  Tamoxifen counseling: We discussed the risks and benefits of tamoxifen. These include but not limited to insomnia, hot flashes, mood changes, vaginal dryness, and weight gain. Although rare, serious side effects including endometrial cancer, risk of blood clots were also discussed. We strongly believe that the benefits far outweigh the risks. Patient understands these risks and consented to starting treatment. Planned treatment duration is 5 years.  Return to clinic after surgery to discuss the final pathology report and come up with an adjuvant treatment plan.

## 2018-02-09 ENCOUNTER — Ambulatory Visit (HOSPITAL_COMMUNITY)
Admission: RE | Admit: 2018-02-09 | Discharge: 2018-02-09 | Disposition: A | Discharge status: Home / Self Care | Payer: Medicare HMO | Source: Ambulatory Visit | Attending: Nurse Practitioner | Admitting: Nurse Practitioner

## 2018-02-09 ENCOUNTER — Encounter (HOSPITAL_COMMUNITY): Payer: Self-pay | Admitting: Nurse Practitioner

## 2018-02-09 VITALS — BP 124/72 | HR 77 | Ht 65.0 in | Wt 152.8 lb

## 2018-02-09 DIAGNOSIS — C50919 Malignant neoplasm of unspecified site of unspecified female breast: Secondary | ICD-10-CM | POA: Diagnosis not present

## 2018-02-09 DIAGNOSIS — Z7984 Long term (current) use of oral hypoglycemic drugs: Secondary | ICD-10-CM | POA: Diagnosis not present

## 2018-02-09 DIAGNOSIS — E119 Type 2 diabetes mellitus without complications: Secondary | ICD-10-CM | POA: Insufficient documentation

## 2018-02-09 DIAGNOSIS — Z801 Family history of malignant neoplasm of trachea, bronchus and lung: Secondary | ICD-10-CM | POA: Diagnosis not present

## 2018-02-09 DIAGNOSIS — I4891 Unspecified atrial fibrillation: Secondary | ICD-10-CM | POA: Diagnosis not present

## 2018-02-09 DIAGNOSIS — Z90710 Acquired absence of both cervix and uterus: Secondary | ICD-10-CM | POA: Insufficient documentation

## 2018-02-09 DIAGNOSIS — Z79899 Other long term (current) drug therapy: Secondary | ICD-10-CM | POA: Insufficient documentation

## 2018-02-09 DIAGNOSIS — E785 Hyperlipidemia, unspecified: Secondary | ICD-10-CM | POA: Diagnosis not present

## 2018-02-09 DIAGNOSIS — Z8673 Personal history of transient ischemic attack (TIA), and cerebral infarction without residual deficits: Secondary | ICD-10-CM | POA: Diagnosis not present

## 2018-02-09 NOTE — Progress Notes (Addendum)
Primary Care Physician: Shon Baton, MD Referring Physician: Dr. Meryl Crutch Dorothy Herring is a 77 y.o. female with a h/o remote TIA, 7 years ago, DM, that was recently diagnosed with breast cancer and was seeing Dr. Virgina Jock yesterday after an irregular heart beat was noted at the [facility] and Ekg revealed rate controlled afib. Pt was asymptomatic. She has no idea when she may have gone into this rhythm, no triggers identified other than possibly 2/2 recent stress of her diagnosis. She has pending surgery and radiation as her treatment plan for breast CA. She wants to  have the surgery as soon as possible. Her CHA2DS2VASc score is 5.  Ekg shows rate controlled afib at 77 bpm. She does not drink excessive alcohol, smoke or use excessive caffeine. No snoring. She is physically active.  Today, she denies symptoms of palpitations, chest pain, shortness of breath, orthopnea, PND, lower extremity edema, dizziness, presyncope, syncope, or neurologic sequela. The patient is tolerating medications without difficulties and is otherwise without complaint today.   Past Medical History:  Diagnosis Date  . Diabetes mellitus   . Hyperlipidemia   . TIA (transient ischemic attack)    Past Surgical History:  Procedure Laterality Date  . ABDOMINAL HYSTERECTOMY    . BREAST BIOPSY      Current Outpatient Medications  Medication Sig Dispense Refill  . empagliflozin (JARDIANCE) 10 MG TABS tablet Take 10 mg by mouth daily.    Marland Kitchen gabapentin (NEURONTIN) 300 MG capsule Take 300 mg by mouth 2 (two) times daily.    . rosuvastatin (CRESTOR) 20 MG tablet Take 20 mg by mouth daily.      No current facility-administered medications for this encounter.     No Known Allergies  Social History   Socioeconomic History  . Marital status: Widowed    Spouse name: Not on file  . Number of children: 1  . Years of education: Not on file  . Highest education level: Not on file  Occupational History  . Occupation:  Housewife  Social Needs  . Financial resource strain: Not on file  . Food insecurity:    Worry: Not on file    Inability: Not on file  . Transportation needs:    Medical: Not on file    Non-medical: Not on file  Tobacco Use  . Smoking status: Never Smoker  . Smokeless tobacco: Never Used  Substance and Sexual Activity  . Alcohol use: No  . Drug use: No  . Sexual activity: Not on file  Lifestyle  . Physical activity:    Days per week: Not on file    Minutes per session: Not on file  . Stress: Not on file  Relationships  . Social connections:    Talks on phone: Not on file    Gets together: Not on file    Attends religious service: Not on file    Active member of club or organization: Not on file    Attends meetings of clubs or organizations: Not on file    Relationship status: Not on file  . Intimate partner violence:    Fear of current or ex partner: Not on file    Emotionally abused: Not on file    Physically abused: Not on file    Forced sexual activity: Not on file  Other Topics Concern  . Not on file  Social History Narrative  . Not on file    Family History  Problem Relation Age of Onset  .  Lung cancer Father   . Colon cancer Neg Hx     ROS- All systems are reviewed and negative except as per the HPI above  Physical Exam: Vitals:   02/09/18 1330  BP: 124/72  Pulse: 77  Weight: 152 lb 12.8 oz (69.3 kg)  Height: 5\' 5"  (1.651 m)   Wt Readings from Last 3 Encounters:  02/09/18 152 lb 12.8 oz (69.3 kg)  02/08/18 155 lb 11.2 oz (70.6 kg)  11/30/11 163 lb 6.4 oz (74.1 kg)    Labs: Lab Results  Component Value Date   NA 140 02/08/2018   K 4.0 02/08/2018   CL 105 02/08/2018   CO2 26 02/08/2018   GLUCOSE 193 (H) 02/08/2018   BUN 21 02/08/2018   CREATININE 0.90 02/08/2018   CALCIUM 9.5 02/08/2018   No results found for: INR No results found for: CHOL, HDL, LDLCALC, TRIG   GEN- The patient is well appearing, alert and oriented x 3 today.   Head-  normocephalic, atraumatic Eyes-  Sclera clear, conjunctiva pink Ears- hearing intact Oropharynx- clear Neck- supple, no JVP Lymph- no cervical lymphadenopathy Lungs- Clear to ausculation bilaterally, normal work of breathing Heart- irregular rate and rhythm, no murmurs, rubs or gallops, PMI not laterally displaced GI- soft, NT, ND, + BS Extremities- no clubbing, cyanosis, or edema MS- no significant deformity or atrophy Skin- no rash or lesion Psych- euthymic mood, full affect Neuro- strength and sensation are intact  EKG-afib at 77 bpm, qrs int 82 ms, qtc 425 ms Records from Dr. Keane Police office reviewed, including eKg AND OFFICE NOTE Labs from [facility] reviewed, CMET wnl other than elevated glucose. Creatinine 0.90, CBC normal   Assessment and Plan: 1. Afib General education re afib Unsure if it is paroxysmal or persistent but pt is asymptomatic and it is rate controlled Cannot try to get her back in rhythm at this point as  she is not on anticoagulation and that would take 3 weeks of loading on anticoagualtion Pt anxious to have her surgery Echo scheduled for tomorrow at [facility] at 10 am I discussed with Dr Rayann Heman...if echo without any structural abnormalities, and without any known coronary disease or exertional symptoms, then pt should be able to proceed with her surgical plans with rate controlled afib  2. CHA2DS2VASc Score of 5 She stopped plavix yesterday for surgery that she was on for years for h/o TIA She will remain off Plavix She will start eliquis 5 mg bid asap after surgery  She will be  seen back in 2-3  weeks after surgery   Addendum- Reviewed echo this am and structure looks ok, pt informed that she can proceed to surgery  Butch Penny C. Caeleb Batalla, [facility] [address] [place], [place] [address] [phone]

## 2018-02-10 ENCOUNTER — Ambulatory Visit: Payer: Self-pay | Admitting: Surgery

## 2018-02-10 ENCOUNTER — Other Ambulatory Visit (HOSPITAL_COMMUNITY): Payer: Self-pay | Admitting: *Deleted

## 2018-02-10 ENCOUNTER — Ambulatory Visit (HOSPITAL_COMMUNITY)
Admission: RE | Admit: 2018-02-10 | Discharge: 2018-02-10 | Disposition: A | Discharge status: Home / Self Care | Payer: Medicare HMO | Source: Ambulatory Visit | Attending: Nurse Practitioner | Admitting: Nurse Practitioner

## 2018-02-10 DIAGNOSIS — I4891 Unspecified atrial fibrillation: Secondary | ICD-10-CM | POA: Insufficient documentation

## 2018-02-10 DIAGNOSIS — I071 Rheumatic tricuspid insufficiency: Secondary | ICD-10-CM | POA: Insufficient documentation

## 2018-02-10 DIAGNOSIS — Z853 Personal history of malignant neoplasm of breast: Secondary | ICD-10-CM | POA: Insufficient documentation

## 2018-02-10 MED ORDER — APIXABAN 5 MG PO TABS: 5.0000 mg | ORAL_TABLET | Freq: Two times a day (BID) | ORAL | 0 refills | Status: DC

## 2018-02-10 NOTE — Addendum Note (Signed)
Encounter addended by: Sherran Needs, NP on: 02/10/2018 12:27 PM  Actions taken: Sign clinical note

## 2018-02-10 NOTE — Progress Notes (Signed)
  Echocardiogram 2D Echocardiogram has been performed.  Dorothy Herring L Androw 02/10/2018, 10:38 AM

## 2018-02-14 ENCOUNTER — Telehealth: Payer: Self-pay | Admitting: *Deleted

## 2018-02-14 NOTE — Telephone Encounter (Signed)
Spoke to pt concerning [place] from 02/08/18. Denies questions or concerns regarding dx or treatment care plan. Pt did question when sx will be scheduled. Informed pt that I spoke with [facility] and will receive a call later today or tomorrow with a sx date. Received verbal understanding. Denies further questions or needs.

## 2018-02-17 ENCOUNTER — Encounter (HOSPITAL_BASED_OUTPATIENT_CLINIC_OR_DEPARTMENT_OTHER): Payer: Self-pay | Admitting: *Deleted

## 2018-02-17 ENCOUNTER — Other Ambulatory Visit: Payer: Self-pay

## 2018-02-20 ENCOUNTER — Telehealth: Payer: Self-pay | Admitting: Hematology and Oncology

## 2018-02-20 NOTE — Telephone Encounter (Signed)
Mailed patient calendar of upcoming June appointments per 5/30 sch message

## 2018-02-22 ENCOUNTER — Other Ambulatory Visit: Payer: Self-pay | Admitting: *Deleted

## 2018-02-22 DIAGNOSIS — D0512 Intraductal carcinoma in situ of left breast: Secondary | ICD-10-CM

## 2018-02-23 DIAGNOSIS — D0592 Unspecified type of carcinoma in situ of left breast: Secondary | ICD-10-CM | POA: Diagnosis not present

## 2018-02-23 NOTE — Progress Notes (Signed)
Ensure pre surgery drink given with instructions to complete by 0900 dos, surgical soap given with instructions, pt verbalized understanding. 

## 2018-02-24 ENCOUNTER — Encounter: Payer: Self-pay | Admitting: Radiation Oncology

## 2018-02-26 NOTE — H&P (Signed)
Dorothy Herring Location: [facility] Patient #: [id number] DOB: [birth year] Undefined / Language: Cleophus Molt / Race: White Female  History of Present Illness   The patient is a 77 year old female who presents with a complaint of breast cancer.  The PCP is Dr. Burnadette Pop  The patient was referred by Dr. Domenick Bookbinder  The pateint is at the [facility] - Oncology is Drs. Philis Nettle  She comes with daughter, Dorothey Baseman, neice, Oliver Barre, and friend, Linward Natal  Her last mammogram was Oct 2018. a couple weeks ago she noticedsome drops of fluid in her bra on the left side. Then she noticed a period where she had a moderate amount of blood on the inside of her bra. This prompted her to get a repeat mammogram and evaluation. She had a benign biopsy at [facility] on 06/07/2012. She had an open biopsy of her left breast at the 4:00 position remotely for which she described as scar tissue. there is no pathology report in epic for this biopsy.  Mammograms: Mammogram at [facility] on 01/31/2018. They saw an asymmetry in the left breast UIQ and recommeded follow up US. On [place], there was no concern in the UIQ, but she had a 0.5 cm mass in the retroareolar area that was suspicous. 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 The patient is at the [facility], which includes medical oncology and radiation oncology. I discussed the surgical options of lumpectomy vs. mastectomy. If mastectomy, there is the possibility of reconstruction. I discussed the options of lymph node biopsy. The treatment plan depends on the pathologic staging of the tumor and the patient's personal wishes. The risks of surgery include,  but are not limited to, bleeding, infection, the need for further surgery, and nerve injury. The patient has been given literature on the treatment of breast cancer.  Plan: 1) I spoke with Dr. Brigitte Pulse. They are going to do an EKG and follow up on her rythm, 2) left breast lumpectomy (seed loc), 3) Hold the Plavix for 5 days.  Past Medical History: 1. DM x 12 years. She is on insulin 2. TIA - in [place] in 2012 She's had no further neurologic symptoms. 3. On Plavix for TIA's 4. Colonoscopy about 7 years ago.  Social History:  Widowed. Lives by self. She has one child. She comes with daughter, Dorothey Baseman, Charlyn Minerva, and friend, Linward Natal  Her brother was in a bad accident yesterday (February 18, 2018) and died.  I talked to her on the phone.  She is scheduled for left RSL lump at [facility] on 6/10.  She wants to go ahead with surgery  Past Surgical History Tawni Pummel, RN; 02/08/2018 7:26 AM) Breast Biopsy  Bilateral. Cataract Surgery  Bilateral. Hysterectomy (not due to cancer) - Complete  Oral Surgery   Diagnostic Studies History Tawni Pummel, RN; 02/08/2018 7:26 AM) Colonoscopy  5-10 years ago Mammogram  within last year  Medication History Tawni Pummel, RN; 02/08/2018 7:26 AM) Medications Reconciled  Social History Tawni Pummel, RN; 02/08/2018 7:26 AM) Alcohol use  Occasional alcohol use. Caffeine use  Coffee. No drug use  Tobacco use  Never smoker.  Family History Tawni Pummel, RN; 02/08/2018 7:26 AM) Heart Disease  Brother, Family Members  In General, Sister. Hypertension  Sister. Migraine Headache  Daughter, Sister. Respiratory Condition  Brother.  Pregnancy / Birth History Tawni Pummel, RN; 02/08/2018 7:26 AM) Age at menarche  80 years. Age of menopause  51-55 Contraceptive History  Oral contraceptives. Gravida  1 Irregular periods  Maternal age  10-25 Para  23  Other Problems Tawni Pummel, RN;  02/08/2018 7:26 AM) Anxiety Disorder  Breast Cancer  Cerebrovascular Accident  Diabetes Mellitus  Hypercholesterolemia  Lump In Breast  Oophorectomy   Review of Systems Sunday Spillers Ledford RN; 02/08/2018 7:26 AM) General Not Present- Appetite Loss, Chills, Fatigue, Fever, Night Sweats, Weight Gain and Weight Loss. Skin Not Present- Change in Wart/Mole, Dryness, Hives, Jaundice, New Lesions, Non-Healing Wounds, Rash and Ulcer. HEENT Present- Wears glasses/contact lenses. Not Present- Earache, Hearing Loss, Hoarseness, Nose Bleed, Oral Ulcers, Ringing in the Ears, Seasonal Allergies, Sinus Pain, Sore Throat, Visual Disturbances and Yellow Eyes. Respiratory Not Present- Bloody sputum, Chronic Cough, Difficulty Breathing, Snoring and Wheezing. Breast Present- Breast Pain and Nipple Discharge. Not Present- Breast Mass and Skin Changes. Cardiovascular Not Present- Chest Pain, Difficulty Breathing Lying Down, Leg Cramps, Palpitations, Rapid Heart Rate, Shortness of Breath and Swelling of Extremities. Gastrointestinal Not Present- Abdominal Pain, Bloating, Bloody Stool, Change in Bowel Habits, Chronic diarrhea, Constipation, Difficulty Swallowing, Excessive gas, Gets full quickly at meals, Hemorrhoids, Indigestion, Nausea, Rectal Pain and Vomiting. Female Genitourinary Not Present- Frequency, Nocturia, Painful Urination, Pelvic Pain and Urgency. Musculoskeletal Not Present- Back Pain, Joint Pain, Joint Stiffness, Muscle Pain, Muscle Weakness and Swelling of Extremities. Neurological Not Present- Decreased Memory, Fainting, Headaches, Numbness, Seizures, Tingling, Tremor, Trouble walking and Weakness. Psychiatric Present- Anxiety. Not Present- Bipolar, Change in Sleep Pattern, Depression, Fearful and Frequent crying. Hematology Present- Blood Thinners. Not Present- Easy Bruising, Excessive bleeding, Gland problems, HIV and Persistent Infections.  Physical Exam  General: WN older WF alert and  generally healthy appearing. Skin: Inspection and palpation of the skin unremarkable.  Eyes: Conjunctivae white, pupils equal. Face, ears, nose, mouth, and throat: Face - normal. Normal ears and nose. Lips and teeth normal.  Neck: Supple. No mass. Trachea midline. No thyroid mass.  Lymph Nodes: No supraclavicular or cervical adenopathy. No axillary adenopathy.  Lungs: Normal respiratory effort. Clear to auscultation and symmetric breath sounds. Cardiovascular: Irregular rythm.  No murmur or rub.  Breast: Right - moderately large, no mass Left - moderately large. Ecchymosis around nipple spreading out about 9 cm. I do not feel a mass. She had no nipple discharge today.  She has a scar in the LOQ of the breast from a prior biopsy.  Abdomen: Soft. No mass. Liver and spleen not palpable. No tenderness. No hernia. Normal bowel sounds.  Rectal: Not done.  Musculoskeletal/extremities: Normal gait. Good strength and ROM in upper and lower extremities.   Neurologic: Grossly intact to motor and sensory function.   Psychiatric: Has normal mood and affect. Judgement and insight appear normal.    Assessment & Plan  1.  BREAST CANCER, STAGE 0, LEFT (D05.92)  Story: Left breast ibiopsy on 02/01/2018 ([id number]) showed DCIS, ER - 100%, PR - 100%  Oncology - [facility]  Plan:   1) I spoke with Dr. Brigitte Pulse. They are going to do an EKG and follow up on her rhythm.  She did prove to have A. Fib.   2) Left breast lumpectomy (seed loc),   3) Hold the Plavix for 5 days.  2.  ANTICOAGULATED (Z79.01) for TIA's  Impression: On Plavix  3.  New onset A  Fib  Was seen by Roderic Palau, NP.  She is getting an echo on 5/24 - this was okay 4. DM x 12 years.  She is on insulin 5. TIA - in [place] in 2012  She's had no further neurologic symptoms.  6.   Her brother was in a bad accident on 03-11-18 and died.  I talked to her on the phone.  She  is scheduled for left RSL lump at [facility] on 6/10.  She wants to go ahead with surgery   Alphonsa Overall, MD, [facility] Pager: [phone] Office phone:  [phone]

## 2018-02-27 ENCOUNTER — Ambulatory Visit (HOSPITAL_COMMUNITY): Payer: Medicare HMO | Admitting: Nurse Practitioner

## 2018-02-27 ENCOUNTER — Encounter (HOSPITAL_BASED_OUTPATIENT_CLINIC_OR_DEPARTMENT_OTHER)
Admission: RE | Disposition: A | Discharge status: Home / Self Care | Payer: Self-pay | Source: Ambulatory Visit | Attending: Surgery

## 2018-02-27 ENCOUNTER — Ambulatory Visit (HOSPITAL_BASED_OUTPATIENT_CLINIC_OR_DEPARTMENT_OTHER)
Admission: RE | Admit: 2018-02-27 | Discharge: 2018-02-27 | Disposition: A | Discharge status: Home / Self Care | Payer: Medicare HMO | Source: Ambulatory Visit | Attending: Surgery | Admitting: Surgery

## 2018-02-27 ENCOUNTER — Other Ambulatory Visit: Payer: Self-pay

## 2018-02-27 ENCOUNTER — Ambulatory Visit (HOSPITAL_BASED_OUTPATIENT_CLINIC_OR_DEPARTMENT_OTHER): Payer: Medicare HMO | Admitting: Anesthesiology

## 2018-02-27 ENCOUNTER — Encounter (HOSPITAL_BASED_OUTPATIENT_CLINIC_OR_DEPARTMENT_OTHER): Payer: Self-pay

## 2018-02-27 DIAGNOSIS — Z79899 Other long term (current) drug therapy: Secondary | ICD-10-CM | POA: Diagnosis not present

## 2018-02-27 DIAGNOSIS — D0512 Intraductal carcinoma in situ of left breast: Secondary | ICD-10-CM | POA: Insufficient documentation

## 2018-02-27 DIAGNOSIS — C50412 Malignant neoplasm of upper-outer quadrant of left female breast: Secondary | ICD-10-CM | POA: Diagnosis not present

## 2018-02-27 DIAGNOSIS — E119 Type 2 diabetes mellitus without complications: Secondary | ICD-10-CM | POA: Diagnosis not present

## 2018-02-27 DIAGNOSIS — Z794 Long term (current) use of insulin: Secondary | ICD-10-CM | POA: Insufficient documentation

## 2018-02-27 DIAGNOSIS — F419 Anxiety disorder, unspecified: Secondary | ICD-10-CM | POA: Insufficient documentation

## 2018-02-27 DIAGNOSIS — E7800 Pure hypercholesterolemia, unspecified: Secondary | ICD-10-CM | POA: Insufficient documentation

## 2018-02-27 DIAGNOSIS — Z8673 Personal history of transient ischemic attack (TIA), and cerebral infarction without residual deficits: Secondary | ICD-10-CM | POA: Insufficient documentation

## 2018-02-27 DIAGNOSIS — R69 Illness, unspecified: Secondary | ICD-10-CM | POA: Diagnosis not present

## 2018-02-27 DIAGNOSIS — N6012 Diffuse cystic mastopathy of left breast: Secondary | ICD-10-CM | POA: Diagnosis not present

## 2018-02-27 DIAGNOSIS — I4891 Unspecified atrial fibrillation: Secondary | ICD-10-CM | POA: Diagnosis not present

## 2018-02-27 HISTORY — DX: Malignant (primary) neoplasm, unspecified: C80.1

## 2018-02-27 HISTORY — PX: BREAST LUMPECTOMY WITH RADIOACTIVE SEED LOCALIZATION: SHX6424

## 2018-02-27 LAB — GLUCOSE, CAPILLARY
GLUCOSE-CAPILLARY: 209 mg/dL — AB (ref 65–99)
Glucose-Capillary: 145 mg/dL — ABNORMAL HIGH (ref 65–99)

## 2018-02-27 SURGERY — BREAST LUMPECTOMY WITH RADIOACTIVE SEED LOCALIZATION
Anesthesia: General | Site: Breast | Laterality: Left

## 2018-02-27 MED ORDER — ACETAMINOPHEN 500 MG PO TABS
1000.0000 mg | ORAL_TABLET | ORAL | Status: AC
  Administered 2018-02-27: 1000 mg via ORAL

## 2018-02-27 MED ORDER — LIDOCAINE HCL (CARDIAC) PF 100 MG/5ML IV SOSY
PREFILLED_SYRINGE | INTRAVENOUS | Status: DC | PRN
  Administered 2018-02-27: 50 mg via INTRAVENOUS

## 2018-02-27 MED ORDER — CEFAZOLIN SODIUM-DEXTROSE 2-4 GM/100ML-% IV SOLN
INTRAVENOUS | Status: AC
  Filled 2018-02-27: qty 100

## 2018-02-27 MED ORDER — SODIUM CHLORIDE 0.9 % IJ SOLN
INTRAMUSCULAR | Status: AC
  Filled 2018-02-27: qty 10

## 2018-02-27 MED ORDER — OXYCODONE HCL 5 MG PO TABS: 5.0000 mg | ORAL_TABLET | Freq: Once | ORAL | Status: DC | PRN

## 2018-02-27 MED ORDER — FENTANYL CITRATE (PF) 100 MCG/2ML IJ SOLN
INTRAMUSCULAR | Status: AC
  Filled 2018-02-27: qty 2

## 2018-02-27 MED ORDER — ONDANSETRON HCL 4 MG/2ML IJ SOLN
INTRAMUSCULAR | Status: DC | PRN
  Administered 2018-02-27: 4 mg via INTRAVENOUS

## 2018-02-27 MED ORDER — SCOPOLAMINE 1 MG/3DAYS TD PT72
MEDICATED_PATCH | TRANSDERMAL | Status: AC
  Filled 2018-02-27: qty 1

## 2018-02-27 MED ORDER — PROPOFOL 10 MG/ML IV BOLUS
INTRAVENOUS | Status: DC | PRN
  Administered 2018-02-27: 160 mg via INTRAVENOUS

## 2018-02-27 MED ORDER — CHLORHEXIDINE GLUCONATE CLOTH 2 % EX PADS: 6.0000 | MEDICATED_PAD | Freq: Once | CUTANEOUS | Status: DC

## 2018-02-27 MED ORDER — 0.9 % SODIUM CHLORIDE (POUR BTL) OPTIME
TOPICAL | Status: DC | PRN
  Administered 2018-02-27: 200 mL

## 2018-02-27 MED ORDER — PROMETHAZINE HCL 25 MG/ML IJ SOLN
INTRAMUSCULAR | Status: AC
  Filled 2018-02-27: qty 1

## 2018-02-27 MED ORDER — ACETAMINOPHEN 500 MG PO TABS
ORAL_TABLET | ORAL | Status: AC
  Filled 2018-02-27: qty 2

## 2018-02-27 MED ORDER — GABAPENTIN 300 MG PO CAPS
ORAL_CAPSULE | ORAL | Status: AC
  Filled 2018-02-27: qty 1

## 2018-02-27 MED ORDER — HYDROCODONE-ACETAMINOPHEN 5-325 MG PO TABS: 1.0000 | ORAL_TABLET | Freq: Four times a day (QID) | ORAL | 0 refills | Status: DC | PRN

## 2018-02-27 MED ORDER — PHENYLEPHRINE 40 MCG/ML (10ML) SYRINGE FOR IV PUSH (FOR BLOOD PRESSURE SUPPORT)
PREFILLED_SYRINGE | INTRAVENOUS | Status: DC | PRN
  Administered 2018-02-27: 80 ug via INTRAVENOUS

## 2018-02-27 MED ORDER — LACTATED RINGERS IV SOLN
INTRAVENOUS | Status: DC
  Administered 2018-02-27 (×2): via INTRAVENOUS

## 2018-02-27 MED ORDER — BUPIVACAINE-EPINEPHRINE 0.25% -1:200000 IJ SOLN
INTRAMUSCULAR | Status: DC | PRN
  Administered 2018-02-27: 30 mL

## 2018-02-27 MED ORDER — PROMETHAZINE HCL 25 MG/ML IJ SOLN
6.2500 mg | INTRAMUSCULAR | Status: DC | PRN
  Administered 2018-02-27: 6.25 mg via INTRAVENOUS

## 2018-02-27 MED ORDER — MEPERIDINE HCL 25 MG/ML IJ SOLN
6.2500 mg | INTRAMUSCULAR | Status: DC | PRN
Start: 2018-02-27 — End: 2018-02-27

## 2018-02-27 MED ORDER — CEFAZOLIN SODIUM-DEXTROSE 2-4 GM/100ML-% IV SOLN
2.0000 g | INTRAVENOUS | Status: AC
  Administered 2018-02-27: 2 g via INTRAVENOUS

## 2018-02-27 MED ORDER — SCOPOLAMINE 1 MG/3DAYS TD PT72
1.0000 | MEDICATED_PATCH | Freq: Once | TRANSDERMAL | Status: DC | PRN
  Administered 2018-02-27: 1.5 mg via TRANSDERMAL

## 2018-02-27 MED ORDER — MIDAZOLAM HCL 2 MG/2ML IJ SOLN: 1.0000 mg | INTRAMUSCULAR | Status: DC | PRN

## 2018-02-27 MED ORDER — OXYCODONE HCL 5 MG/5ML PO SOLN: 5.0000 mg | Freq: Once | ORAL | Status: DC | PRN

## 2018-02-27 MED ORDER — FENTANYL CITRATE (PF) 100 MCG/2ML IJ SOLN
25.0000 ug | INTRAMUSCULAR | Status: DC | PRN
  Administered 2018-02-27 (×2): 50 ug via INTRAVENOUS

## 2018-02-27 MED ORDER — FENTANYL CITRATE (PF) 100 MCG/2ML IJ SOLN
50.0000 ug | INTRAMUSCULAR | Status: DC | PRN
  Administered 2018-02-27: 25 ug via INTRAVENOUS

## 2018-02-27 MED ORDER — LACTATED RINGERS IV SOLN: INTRAVENOUS | Status: DC

## 2018-02-27 MED ORDER — GABAPENTIN 300 MG PO CAPS
300.0000 mg | ORAL_CAPSULE | ORAL | Status: AC
  Administered 2018-02-27: 300 mg via ORAL

## 2018-02-27 MED ORDER — DEXAMETHASONE SODIUM PHOSPHATE 4 MG/ML IJ SOLN
INTRAMUSCULAR | Status: DC | PRN
  Administered 2018-02-27: 10 mg via INTRAVENOUS

## 2018-02-27 SURGICAL SUPPLY — 54 items
ADH SKN CLS APL DERMABOND .7 (GAUZE/BANDAGES/DRESSINGS) ×1
APL SKNCLS STERI-STRIP NONHPOA (GAUZE/BANDAGES/DRESSINGS)
BENZOIN TINCTURE PRP APPL 2/3 (GAUZE/BANDAGES/DRESSINGS) IMPLANT
BINDER BREAST LRG (GAUZE/BANDAGES/DRESSINGS) ×1 IMPLANT
BINDER BREAST MEDIUM (GAUZE/BANDAGES/DRESSINGS) IMPLANT
BINDER BREAST XLRG (GAUZE/BANDAGES/DRESSINGS) IMPLANT
BINDER BREAST XXLRG (GAUZE/BANDAGES/DRESSINGS) IMPLANT
BLADE SURG 10 STRL SS (BLADE) ×2 IMPLANT
BLADE SURG 15 STRL LF DISP TIS (BLADE) ×1 IMPLANT
BLADE SURG 15 STRL SS (BLADE) ×2
CANISTER SUC SOCK COL 7IN (MISCELLANEOUS) IMPLANT
CANISTER SUCT 1200ML W/VALVE (MISCELLANEOUS) ×2 IMPLANT
CHLORAPREP W/TINT 26ML (MISCELLANEOUS) ×2 IMPLANT
CLIP VESOCCLUDE SM WIDE 6/CT (CLIP) ×1 IMPLANT
COVER BACK TABLE 60X90IN (DRAPES) ×2 IMPLANT
COVER MAYO STAND STRL (DRAPES) ×2 IMPLANT
COVER PROBE W GEL 5X96 (DRAPES) ×2 IMPLANT
DECANTER SPIKE VIAL GLASS SM (MISCELLANEOUS) IMPLANT
DERMABOND ADVANCED (GAUZE/BANDAGES/DRESSINGS) ×1
DERMABOND ADVANCED .7 DNX12 (GAUZE/BANDAGES/DRESSINGS) ×1 IMPLANT
DEVICE DUBIN W/COMP PLATE 8390 (MISCELLANEOUS) ×2 IMPLANT
DRAPE LAPAROTOMY 100X72 PEDS (DRAPES) ×2 IMPLANT
DRAPE UTILITY XL STRL (DRAPES) ×2 IMPLANT
DRSG PAD ABDOMINAL 8X10 ST (GAUZE/BANDAGES/DRESSINGS) IMPLANT
ELECT COATED BLADE 2.86 ST (ELECTRODE) ×2 IMPLANT
ELECT REM PT RETURN 9FT ADLT (ELECTROSURGICAL) ×2
ELECTRODE REM PT RTRN 9FT ADLT (ELECTROSURGICAL) ×1 IMPLANT
GAUZE SPONGE 4X4 12PLY STRL LF (GAUZE/BANDAGES/DRESSINGS) IMPLANT
GLOVE BIO SURGEON STRL SZ7 (GLOVE) ×1 IMPLANT
GLOVE BIO SURGEON STRL SZ7.5 (GLOVE) ×1 IMPLANT
GLOVE BIOGEL M STRL SZ7.5 (GLOVE) ×1 IMPLANT
GLOVE BIOGEL PI IND STRL 7.0 (GLOVE) IMPLANT
GLOVE BIOGEL PI IND STRL 8 (GLOVE) IMPLANT
GLOVE BIOGEL PI INDICATOR 7.0 (GLOVE) ×2
GLOVE BIOGEL PI INDICATOR 8 (GLOVE) ×1
GLOVE SURG SIGNA 7.5 PF LTX (GLOVE) ×2 IMPLANT
GOWN STRL REUS W/ TWL XL LVL3 (GOWN DISPOSABLE) ×1 IMPLANT
GOWN STRL REUS W/TWL XL LVL3 (GOWN DISPOSABLE) ×6
KIT MARKER MARGIN INK (KITS) ×2 IMPLANT
NDL HYPO 25X1 1.5 SAFETY (NEEDLE) ×1 IMPLANT
NEEDLE HYPO 25X1 1.5 SAFETY (NEEDLE) ×2 IMPLANT
NS IRRIG 1000ML POUR BTL (IV SOLUTION) ×1 IMPLANT
PACK BASIN DAY SURGERY FS (CUSTOM PROCEDURE TRAY) ×2 IMPLANT
PENCIL BUTTON HOLSTER BLD 10FT (ELECTRODE) ×2 IMPLANT
SLEEVE SCD COMPRESS KNEE MED (MISCELLANEOUS) ×2 IMPLANT
SPONGE LAP 18X18 RF (DISPOSABLE) ×2 IMPLANT
STRIP CLOSURE SKIN 1/4X4 (GAUZE/BANDAGES/DRESSINGS) IMPLANT
SUT MNCRL AB 4-0 PS2 18 (SUTURE) ×2 IMPLANT
SUT VICRYL 3-0 CR8 SH (SUTURE) ×2 IMPLANT
SYR CONTROL 10ML LL (SYRINGE) ×2 IMPLANT
TOWEL GREEN STERILE FF (TOWEL DISPOSABLE) ×2 IMPLANT
TOWEL OR NON WOVEN STRL DISP B (DISPOSABLE) ×2 IMPLANT
TUBE CONNECTING 20X1/4 (TUBING) ×2 IMPLANT
YANKAUER SUCT BULB TIP NO VENT (SUCTIONS) ×2 IMPLANT

## 2018-02-27 NOTE — Discharge Instructions (Signed)
°  Post Anesthesia Home Care Instructions  Activity: Get plenty of rest for the remainder of the day. A responsible individual must stay with you for 24 hours following the procedure.  For the next 24 hours, DO NOT: -Drive a car -Paediatric nurse -Drink alcoholic beverages -Take any medication unless instructed by your physician -Make any legal decisions or sign important papers.  Meals: Start with liquid foods such as gelatin or soup. Progress to regular foods as tolerated. Avoid greasy, spicy, heavy foods. If nausea and/or vomiting occur, drink only clear liquids until the nausea and/or vomiting subsides. Call your physician if vomiting continues.  Special Instructions/Symptoms: Your throat may feel dry or sore from the anesthesia or the breathing tube placed in your throat during surgery. If this causes discomfort, gargle with warm salt water. The discomfort should disappear within 24 hours.  If you had a scopolamine patch placed behind your ear for the management of post- operative nausea and/or vomiting:  1. The medication in the patch is effective for 72 hours, after which it should be removed.  Wrap patch in a tissue and discard in the trash. Wash hands thoroughly with soap and water. 2. You may remove the patch earlier than 72 hours if you experience unpleasant side effects which may include dry mouth, dizziness or visual disturbances. 3. Avoid touching the patch. Wash your hands with soap and water after contact with the patch.      [facility] - DISCHARGE INSTRUCTIONS TO PATIENT  Activity:  Driving - May drive in one or 2 days, if doing well and off pain meds   Lifting - No lifting more than 15 pounds for 5 days, then no limit  Wound Care:   Leave the dressing on for 2 days, then you may take the bandage off and shower.  Diet:  As tolerated.  Follow up appointment:  Call Dr. Pollie Friar office [facility]) at [phone] for an appointment in 2 to  3 weeks.  Medications and dosages:  Resume your home medications.  You have a prescription for:  Vicodin  Call Dr. Lucia Gaskins or his office  [phone]) if you have:  Temperature greater than 100.4,  Persistent nausea and vomiting,  Severe uncontrolled pain,  Redness, tenderness, or signs of infection (pain, swelling, redness, odor or green/yellow discharge around the site),  Difficulty breathing, headache or visual disturbances,  Any other questions or concerns you may have after discharge.  In an emergency, call 911 or go to an Emergency Department at a nearby hospital.

## 2018-02-27 NOTE — Interval H&P Note (Signed)
History and Physical Interval Note:  02/27/2018 1:55 PM  Dorothy Herring  has presented today for surgery, with the diagnosis of LEFT BREAST CANCER  The various methods of treatment have been discussed with the patient and family.  Daughter and friends with patient.  Seed in place.  After consideration of risks, benefits and other options for treatment, the patient has consented to  Procedure(s): BREAST LUMPECTOMY WITH RADIOACTIVE SEED LOCALIZATION (Left) as a surgical intervention .  The patient's history has been reviewed, patient examined, no change in status, stable for surgery.  I have reviewed the patient's chart and labs.  Questions were answered to the patient's satisfaction.     Shann Medal

## 2018-02-27 NOTE — Transfer of Care (Signed)
Immediate Anesthesia Transfer of Care Note  Patient: Dorothy Herring  Procedure(s) Performed: BREAST LUMPECTOMY WITH RADIOACTIVE SEED LOCALIZATION (Left Breast)  Patient Location: PACU  Anesthesia Type:General  Level of Consciousness: sedated  Airway & Oxygen Therapy: Patient Spontanous Breathing and Patient connected to face mask oxygen  Post-op Assessment: Report given to RN and Post -op Vital signs reviewed and stable  Post vital signs: Reviewed and stable  Last Vitals:  Vitals Value Taken Time  BP 116/64 02/27/2018  3:37 PM  Temp    Pulse 78 02/27/2018  3:38 PM  Resp 13 02/27/2018  3:38 PM  SpO2 100 % 02/27/2018  3:38 PM  Vitals shown include unvalidated device data.  Last Pain:  Vitals:   02/27/18 1132  TempSrc: Oral         Complications: No apparent anesthesia complications

## 2018-02-27 NOTE — Anesthesia Procedure Notes (Signed)
Procedure Name: LMA Insertion Date/Time: 02/27/2018 2:23 PM Performed by: Marrianne Mood, CRNA Pre-anesthesia Checklist: Patient identified, Emergency Drugs available, Suction available and Patient being monitored Patient Re-evaluated:Patient Re-evaluated prior to induction Oxygen Delivery Method: Circle system utilized Preoxygenation: Pre-oxygenation with 100% oxygen Induction Type: IV induction Ventilation: Mask ventilation without difficulty LMA: LMA inserted LMA Size: 4.0 Number of attempts: 1 Airway Equipment and Method: Bite block Placement Confirmation: positive ETCO2 Tube secured with: Tape Dental Injury: Teeth and Oropharynx as per pre-operative assessment

## 2018-02-27 NOTE — Anesthesia Preprocedure Evaluation (Addendum)
Anesthesia Evaluation  Patient identified by MRN, date of birth, ID band Patient awake    Reviewed: Allergy & Precautions, NPO status , Patient's Chart, lab work & pertinent test results  Airway Mallampati: II  TM Distance: >3 FB Neck ROM: Full    Dental  (+) Partial Lower, Partial Upper   Pulmonary neg pulmonary ROS,    breath sounds clear to auscultation       Cardiovascular negative cardio ROS  + dysrhythmias Atrial Fibrillation  Rhythm:Irregular Rate:Normal     Neuro/Psych TIACVA    GI/Hepatic negative GI ROS, Neg liver ROS,   Endo/Other  diabetes, Type 2, Insulin Dependent  Renal/GU negative Renal ROS     Musculoskeletal negative musculoskeletal ROS (+)   Abdominal Normal abdominal exam  (+)   Peds  Hematology negative hematology ROS (+)   Anesthesia Other Findings   Reproductive/Obstetrics                            Lab Results  Component Value Date   WBC 6.7 02/08/2018   HGB 13.1 02/08/2018   HCT 39.8 02/08/2018   MCV 86.1 02/08/2018   PLT 179 02/08/2018   Lab Results  Component Value Date   CREATININE 0.90 02/08/2018   BUN 21 02/08/2018   NA 140 02/08/2018   K 4.0 02/08/2018   CL 105 02/08/2018   CO2 26 02/08/2018   No results found for: INR, PROTIME  EKG: atrial fibrillation.  Echo: - Left ventricle: The cavity size was normal. Wall thickness was   normal. Systolic function was normal. The estimated ejection   fraction was in the range of 60% to 65%. Wall motion was normal;   there were no regional wall motion abnormalities. There was no   evidence of elevated ventricular filling pressure by Doppler   parameters. GLS: -16.3% - Mitral valve: There was trivial regurgitation. - Tricuspid valve: There was moderate regurgitation.   Anesthesia Physical Anesthesia Plan  ASA: III  Anesthesia Plan: General   Post-op Pain Management:    Induction:  Intravenous  PONV Risk Score and Plan: 4 or greater and Ondansetron, Dexamethasone and Treatment may vary due to age or medical condition  Airway Management Planned: LMA  Additional Equipment: None  Intra-op Plan:   Post-operative Plan: Extubation in OR  Informed Consent: I have reviewed the patients History and Physical, chart, labs and discussed the procedure including the risks, benefits and alternatives for the proposed anesthesia with the patient or authorized representative who has indicated his/her understanding and acceptance.   Dental advisory given  Plan Discussed with: CRNA  Anesthesia Plan Comments: (Cardiology note reviewed.   Crissie Sickles Smith Robert, MD, [facility]esthesiology  )      Anesthesia Quick Evaluation

## 2018-02-27 NOTE — Op Note (Signed)
02/27/2018  3:30 PM  PATIENT:  Dorothy Herring DOB: 1941-07-23 MRN: [id number]  PREOP DIAGNOSIS:   LEFT BREAST CANCER  POSTOP DIAGNOSIS:    Left breast cancer, 2 o'clock position (Tis, N0)  PROCEDURE:   Procedure(s):  Left BREAST LUMPECTOMY WITH RADIOACTIVE SEED LOCALIZATION  SURGEON:   Alphonsa Overall, M.D.  ANESTHESIA:   general  Anesthesiologist: Effie Berkshire, MD CRNA: Lieutenant Diego, CRNA; Genelle Bal, CRNA  General  EBL:  minimal  ml  DRAINS:  none   LOCAL MEDICATIONS USED:   30 cc 1/4% marcaine  SPECIMEN:   Left breast lumpectomy (6 color paint), lateral margin (suture anterior)  COUNTS CORRECT:  YES  INDICATIONS FOR PROCEDURE:  Dorothy Herring is a 77 y.o. (DOB: Feb 11, 1941) white female whose primary care physician is Shon Baton, MD and comes for left breast lumpectomy.  Ms. Lux was [facility] with Drs. Philis Nettle for left breast DCIS biopsied at [facility].  The options for breast cancer treatment have been discussed with the patient. She elected to proceed with lumpectomy. She has DCIS, so she only needs a breast lumpectomy.    The indications and potential complications of surgery were explained to the patient. Potential complications include, but are not limited to, bleeding, infection, the need for further surgery, and nerve injury.     She had a I131 seed placed on 02/23/2018 in her left breast at [facility].  The seed is in the 2 o'clock position of the left breast, just at the edge of the areola.  OPERATIVE NOTE:   The patient was taken to operating room # 8 at [facility] where she underwent a general anesthesia  supervised by Anesthesiologist: Effie Berkshire, MD CRNA: Lieutenant Diego, CRNA; Genelle Bal, CRNA. Her left breast was prepped with  ChloraPrep and sterilely draped.    A time-out and the surgical check list was reviewed.    I turned attention to the cancer which was about at the 2 o'clock  position of the left breast.   I used the Neoprobe to identify the I131 seed.  The seed was at the edge of the areola.  I made a lateral circumareolar incision over the seed.   I tried to excise an area around the tumor of at least 1 cm.    I excised this block of breast tissue approximately 3 cm by 4 cm  in diameter.   I painted the lumpectomy specimen with the 6 color paint kit and did a specimen mammogram which confirmed the mass, clip, and the seed were all in the right position in the specimen.  The specimen was sent to pathology who called back to confirm that they have the seed and the specimen.   She had a had round area that came out laterally from the lumpectomy specimen.  I think that this represented the core biopsy tract, but I excised this as additional lateral margin.  I painted the lateral margin and placed a suture anteriorly.   I then irrigated the wound with saline. I infiltrated approximately 30 mL of 1/4% Marcaine between the incisions. I placed 4 clips to mark biopsy cavity, at 12, 3, 6, and 9 o'clock.  I then closed all the wounds in layers using 3-0 Vicryl sutures for the deep layer. At the skin, I closed the incisions with a 4-0 Monocryl suture. The incisions were then painted with Dermabond.  She had gauze place over  the wounds and placed in a breast binder.   The patient tolerated the procedure well, was transported to the recovery room in good condition. Sponge and needle count were correct at the end of the case.   Final pathology is pending.   Alphonsa Overall, MD, [facility] Surgery Pager: [phone] Office phone:  [phone]

## 2018-02-28 ENCOUNTER — Encounter (HOSPITAL_BASED_OUTPATIENT_CLINIC_OR_DEPARTMENT_OTHER): Payer: Self-pay | Admitting: Surgery

## 2018-02-28 NOTE — Addendum Note (Signed)
Addendum  created 02/28/18 1220 by Tawni Millers, CRNA   Charge Capture section accepted, Intraprocedure Staff edited

## 2018-02-28 NOTE — Progress Notes (Signed)
Location of Breast Cancer: Left Breast  Histology per Pathology Report:  02/01/18 Diagnosis Breast, left, needle core biopsy - DUCTAL CARCINOMA IN SITU.  Receptor Status: ER(100%), PR (100%)  02/27/18 Diagnosis 1. Breast, lumpectomy, Left - DUCTAL CARCINOMA IN SITU WITH CALCIFICATIONS, INTERMEDIATE GRADE, SPANNING 0.6 CM. - THE SURGICAL RESECTION MARGINS ARE NEGATIVE FOR CARCINOMA. - SEE ONCOLOGY TABLE BELOW. 2. Breast, excision, New Lateral Margin, Left - FIBROCYSTIC CHANGES WITH CALCIFICATIONS. - THERE IS NO EVIDENCE OF MALIGNANCY.   Did patient present with symptoms or was this found on screening mammography?: She presented with bloody nipple discharge from her Left Breast.   Past/Anticipated interventions by surgeon, if any: 02/27/18 PROCEDURE:   Procedure(s):  Left BREAST LUMPECTOMY WITH RADIOACTIVE SEED LOCALIZATION SURGEON:   Alphonsa Overall, M.D   Past/Anticipated interventions by medical oncology, if any:  02/08/18 Dr. Lindi Adie (Breast Clinic) Recommendation: 1. Breast conserving surgery 2. Followed by adjuvant radiation therapy 3. Followed by antiestrogen therapy with tamoxifen 5 years  Dr. Lindi Adie apt on 03/09/18 Treatment plan : 1.  Adjuvant radiation therapy 2. followed by adjuvant antiestrogen therapy with anastrozole 1 mg daily daily x5 years  Lymphedema issues, if any:  She denies. She reports good arm mobility.   Pain issues, if any:  She denies.   SAFETY ISSUES:  Prior radiation? No  Pacemaker/ICD? No  Possible current pregnancy? No  Is the patient on methotrexate? No  Current Complaints / other details:    BP 116/70   Pulse 64   Temp 97.9 F (36.6 C)   Resp 16   Ht 5\' 4"  (1.626 m)   Wt 149 lb 3.2 oz (67.7 kg)   SpO2 100% Comment: room air  BMI 25.61 kg/m    Wt Readings from Last 3 Encounters:  03/14/18 149 lb 3.2 oz (67.7 kg)  03/14/18 149 lb 3.2 oz (67.7 kg)  03/09/18 146 lb 9.6 oz (66.5 kg)      Shironda Kain, Stephani Police,  RN 02/28/2018,4:10 PM

## 2018-02-28 NOTE — Anesthesia Postprocedure Evaluation (Signed)
Anesthesia Post Note  Patient: SAMYIAH HALVORSEN  Procedure(s) Performed: BREAST LUMPECTOMY WITH RADIOACTIVE SEED LOCALIZATION (Left Breast)     Patient location during evaluation: PACU Anesthesia Type: General Level of consciousness: awake and alert Pain management: pain level controlled Vital Signs Assessment: post-procedure vital signs reviewed and stable Respiratory status: spontaneous breathing, nonlabored ventilation, respiratory function stable and patient connected to nasal cannula oxygen Cardiovascular status: blood pressure returned to baseline and stable Postop Assessment: no apparent nausea or vomiting Anesthetic complications: no Comments: N/V improving.    Last Vitals:  Vitals:   02/27/18 1715 02/27/18 1730  BP:  133/66  Pulse: (!) 105 84  Resp:  16  Temp:  36.5 C  SpO2: 100% 100%    Last Pain:  Vitals:   02/27/18 1730  TempSrc: Oral  PainSc: 4                  Effie Berkshire

## 2018-03-06 ENCOUNTER — Encounter (HOSPITAL_COMMUNITY): Payer: Self-pay | Admitting: Nurse Practitioner

## 2018-03-06 ENCOUNTER — Ambulatory Visit (HOSPITAL_COMMUNITY)
Admission: RE | Admit: 2018-03-06 | Discharge: 2018-03-06 | Disposition: A | Discharge status: Home / Self Care | Payer: Medicare HMO | Source: Ambulatory Visit | Attending: Nurse Practitioner | Admitting: Nurse Practitioner

## 2018-03-06 ENCOUNTER — Telehealth (HOSPITAL_COMMUNITY): Payer: Self-pay | Admitting: *Deleted

## 2018-03-06 VITALS — BP 132/76 | HR 90 | Ht 64.0 in | Wt 147.0 lb

## 2018-03-06 DIAGNOSIS — Z79899 Other long term (current) drug therapy: Secondary | ICD-10-CM | POA: Insufficient documentation

## 2018-03-06 DIAGNOSIS — I4891 Unspecified atrial fibrillation: Secondary | ICD-10-CM | POA: Diagnosis present

## 2018-03-06 DIAGNOSIS — Z801 Family history of malignant neoplasm of trachea, bronchus and lung: Secondary | ICD-10-CM | POA: Diagnosis not present

## 2018-03-06 DIAGNOSIS — Z794 Long term (current) use of insulin: Secondary | ICD-10-CM | POA: Insufficient documentation

## 2018-03-06 DIAGNOSIS — Z820 Family history of epilepsy and other diseases of the nervous system: Secondary | ICD-10-CM | POA: Diagnosis not present

## 2018-03-06 DIAGNOSIS — I481 Persistent atrial fibrillation: Secondary | ICD-10-CM | POA: Diagnosis not present

## 2018-03-06 DIAGNOSIS — Z8673 Personal history of transient ischemic attack (TIA), and cerebral infarction without residual deficits: Secondary | ICD-10-CM | POA: Insufficient documentation

## 2018-03-06 DIAGNOSIS — I4819 Other persistent atrial fibrillation: Secondary | ICD-10-CM

## 2018-03-06 DIAGNOSIS — Z90710 Acquired absence of both cervix and uterus: Secondary | ICD-10-CM | POA: Diagnosis not present

## 2018-03-06 DIAGNOSIS — E119 Type 2 diabetes mellitus without complications: Secondary | ICD-10-CM | POA: Diagnosis not present

## 2018-03-06 DIAGNOSIS — Z85828 Personal history of other malignant neoplasm of skin: Secondary | ICD-10-CM | POA: Diagnosis not present

## 2018-03-06 DIAGNOSIS — E785 Hyperlipidemia, unspecified: Secondary | ICD-10-CM | POA: Insufficient documentation

## 2018-03-06 DIAGNOSIS — L821 Other seborrheic keratosis: Secondary | ICD-10-CM | POA: Diagnosis not present

## 2018-03-06 HISTORY — DX: Other persistent atrial fibrillation: I48.19

## 2018-03-06 NOTE — Telephone Encounter (Signed)
Per Dr. Pollie Friar office ok to start Eliquis today - will make patient aware ok to start.

## 2018-03-06 NOTE — Progress Notes (Signed)
Electrophysiology Office Note   Date:  03/06/2018   ID:  Dorothy Herring June 25, 1941, MRN [id number]  PCP:  Dorothy Baton, MD  Cardiologist:  none Primary Electrophysiologist: Dorothy Grayer, MD    CC: afib   History of Present Illness: Dorothy Herring is a 77 y.o. female who presents today for electrophysiology evaluation.   She has been recently diagnosed with atrial fibrillation (AF clinic notes reviewed).  She is unaware.  She had her breast surgery last week and is healing well.  Today, she denies symptoms of palpitations, chest pain, shortness of breath, orthopnea, PND, lower extremity edema, claudication, dizziness, presyncope, syncope, bleeding, or neurologic sequela. The patient is tolerating medications without difficulties and is otherwise without complaint today.    Past Medical History:  Diagnosis Date  . Cancer ([place])   . Diabetes mellitus   . Hyperlipidemia   . Stroke ([place])    hx tia on plavix   . TIA (transient ischemic attack)   . TIA (transient ischemic attack)    seven years ago   Past Surgical History:  Procedure Laterality Date  . ABDOMINAL HYSTERECTOMY    . BREAST BIOPSY    . BREAST LUMPECTOMY WITH RADIOACTIVE SEED LOCALIZATION Left 02/27/2018   Procedure: BREAST LUMPECTOMY WITH RADIOACTIVE SEED LOCALIZATION;  Surgeon: Dorothy Overall, MD;  Location: [place];  Service: General;  Laterality: Left;     Current Outpatient Medications  Medication Sig Dispense Refill  . empagliflozin (JARDIANCE) 10 MG TABS tablet Take 10 mg by mouth daily.    Marland Kitchen gabapentin (NEURONTIN) 300 MG capsule Take 300 mg by mouth 2 (two) times daily.    Marland Kitchen HYDROcodone-acetaminophen (NORCO/VICODIN) 5-325 MG tablet Take 1-2 tablets by mouth every 6 (six) hours as needed for moderate pain. 15 tablet 0  . insulin glargine (LANTUS) 100 UNIT/ML injection Inject 24 Units into the skin at bedtime.    . rosuvastatin (CRESTOR) 20 MG tablet Take 20 mg by mouth daily.     Marland Kitchen  apixaban (ELIQUIS) 5 MG TABS tablet Take 1 tablet (5 mg total) by mouth 2 (two) times daily. (Patient not taking: Reported on 03/06/2018) 60 tablet 0   No current facility-administered medications for this encounter.     Allergies:   Patient has no known allergies.   Social History:  The patient  reports that she has never smoked. She has never used smokeless tobacco. She reports that she does not drink alcohol or use drugs.   Family History:  The patient's  family history includes Alzheimer's disease in her mother; Lung cancer in her father.    ROS:  Please see the history of present illness.   All other systems are personally reviewed and negative.    PHYSICAL EXAM: VS:  BP 132/76 (BP Location: Right Arm, Patient Position: Sitting, Cuff Size: Normal)   Pulse 90   Ht 5\' 4"  (1.626 m)   Wt 147 lb (66.7 kg)   BMI 25.23 kg/m  , BMI Body mass index is 25.23 kg/m. GEN: Well nourished, well developed, in no acute distress  HEENT: normal  Neck: no JVD, carotid bruits, or masses Cardiac: iRRR; no murmurs, rubs, or gallops,no edema  Respiratory:  clear to auscultation bilaterally, normal work of breathing GI: soft, nontender, nondistended, + BS MS: no deformity or atrophy  Skin: warm and dry  Neuro:  Strength and sensation are intact Psych: euthymic mood, full affect  EKG:  EKG is ordered today. The ekg ordered today is personally  reviewed and shows afib, V rate 90 bpm   Recent Labs: 02/08/2018: ALT 24; BUN 21; Creatinine 0.90; Hemoglobin 13.1; Platelet Count 179; Potassium 4.0; Sodium 140  personally reviewed   Lipid Panel  No results found for: CHOL, TRIG, HDL, CHOLHDL, VLDL, LDLCALC, LDLDIRECT personally reviewed   Wt Readings from Last 3 Encounters:  03/06/18 147 lb (66.7 kg)  02/27/18 150 lb (68 kg)  02/09/18 152 lb 12.8 oz (69.3 kg)      Other studies personally reviewed: Additional studies/ records that were reviewed today include: AF clinic notes, recent echo    Review of the above records today demonstrates: as above   ASSESSMENT AND PLAN:  1.  Persistent afib Asymptomatic Rate controlled chads2vasc score is at least 5.  I have advised that she start eliquis as previously instructed. We will reach out to Dr [facility] office to see if he is ok with starting this now.  If now, we will start once he releases her to initiate anticoagulation. Could consider cardioversion without AAD once appropriately anticoagualted  Follow-up:  AF clinic in 6 weeks  Current medicines are reviewed at length with the patient today.   The patient does not have concerns regarding her medicines.  The following changes were made today:  none  Labs/ tests ordered today include:  Orders Placed This Encounter  Procedures  . EKG 12-Lead     Signed, Dorothy Grayer, MD  03/06/2018 9:57 AM     [facility] [address] [place] [address] [phone] (office) [phone] (fax)

## 2018-03-09 ENCOUNTER — Inpatient Hospital Stay: Payer: Medicare HMO | Attending: Hematology and Oncology | Admitting: Hematology and Oncology

## 2018-03-09 DIAGNOSIS — Z79899 Other long term (current) drug therapy: Secondary | ICD-10-CM | POA: Diagnosis not present

## 2018-03-09 DIAGNOSIS — Z794 Long term (current) use of insulin: Secondary | ICD-10-CM | POA: Insufficient documentation

## 2018-03-09 DIAGNOSIS — D0512 Intraductal carcinoma in situ of left breast: Secondary | ICD-10-CM | POA: Insufficient documentation

## 2018-03-09 DIAGNOSIS — Z170 Estrogen receptor positive status [ER+]: Secondary | ICD-10-CM | POA: Insufficient documentation

## 2018-03-09 NOTE — Progress Notes (Signed)
Patient Care Team: Shon Baton, MD as PCP - General (Internal Medicine) Alphonsa Overall, MD as Consulting Physician (General Surgery) Nicholas Lose, MD as Consulting Physician (Hematology and Oncology) Eppie Gibson, MD as Attending Physician (Radiation Oncology)  DIAGNOSIS:  Encounter Diagnosis  Name Primary?  . Ductal carcinoma in situ (DCIS) of left breast     SUMMARY OF ONCOLOGIC HISTORY:   Ductal carcinoma in situ (DCIS) of left breast   02/01/2018 Initial Diagnosis    Left nipple discharge, mammogram showing asymmetry, ultrasound reveals 0.5 cm area of concern at 12 o'clock position 1 cm from the nipple, axilla negative, biopsy revealed intermediate grade DCIS ER 100%, PR 100%, Tis N0 stage 0 clinical stage      02/27/2018 Surgery    Left lumpectomy: DCIS with calcifications, intermediate grade, 0.6 cm, margins negative, ER 100%, PR 100%, Tis N0 stage 0        CHIEF COMPLIANT: Follow-up after recent lumpectomy for DCIS  INTERVAL HISTORY: Dorothy Herring is a 77 year old with above-mentioned history of left breast DCIS who underwent lumpectomy and is here today to discuss the results.  She is recovering very well from the recent surgery.  Has mild discomfort but otherwise doing quite well.  REVIEW OF SYSTEMS:   Constitutional: Denies fevers, chills or abnormal weight loss Eyes: Denies blurriness of vision Ears, nose, mouth, throat, and face: Denies mucositis or sore throat Respiratory: Denies cough, dyspnea or wheezes Cardiovascular: Denies palpitation, chest discomfort Gastrointestinal:  Denies nausea, heartburn or change in bowel habits Skin: Denies abnormal skin rashes Lymphatics: Denies new lymphadenopathy or easy bruising Neurological:Denies numbness, tingling or new weaknesses Behavioral/Psych: Mood is stable, no new changes  Extremities: No lower extremity edema Breast: Recent left lumpectomy All other systems were reviewed with the patient and are negative.  I  have reviewed the past medical history, past surgical history, social history and family history with the patient and they are unchanged from previous note.  ALLERGIES:  has No Known Allergies.  MEDICATIONS:  Current Outpatient Medications  Medication Sig Dispense Refill  . apixaban (ELIQUIS) 5 MG TABS tablet Take 1 tablet (5 mg total) by mouth 2 (two) times daily. (Patient not taking: Reported on 03/06/2018) 60 tablet 0  . empagliflozin (JARDIANCE) 10 MG TABS tablet Take 10 mg by mouth daily.    Marland Kitchen gabapentin (NEURONTIN) 300 MG capsule Take 300 mg by mouth 2 (two) times daily.    Marland Kitchen HYDROcodone-acetaminophen (NORCO/VICODIN) 5-325 MG tablet Take 1-2 tablets by mouth every 6 (six) hours as needed for moderate pain. 15 tablet 0  . insulin glargine (LANTUS) 100 UNIT/ML injection Inject 24 Units into the skin at bedtime.    . rosuvastatin (CRESTOR) 20 MG tablet Take 20 mg by mouth daily.      No current facility-administered medications for this visit.     PHYSICAL EXAMINATION: ECOG PERFORMANCE STATUS: 1 - Symptomatic but completely ambulatory  Vitals:   03/09/18 0925  BP: 120/75  Pulse: 83  Resp: 18  Temp: 98.4 F (36.9 C)  SpO2: 100%   Filed Weights   03/09/18 0925  Weight: 146 lb 9.6 oz (66.5 kg)    GENERAL:alert, no distress and comfortable SKIN: skin color, texture, turgor are normal, no rashes or significant lesions EYES: normal, Conjunctiva are pink and non-injected, sclera clear OROPHARYNX:no exudate, no erythema and lips, buccal mucosa, and tongue normal  NECK: supple, thyroid normal size, non-tender, without nodularity LYMPH:  no palpable lymphadenopathy in the cervical, axillary or inguinal LUNGS: clear  to auscultation and percussion with normal breathing effort HEART: regular rate & rhythm and no murmurs and no lower extremity edema ABDOMEN:abdomen soft, non-tender and normal bowel sounds MUSCULOSKELETAL:no cyanosis of digits and no clubbing  NEURO: alert & oriented x  3 with fluent speech, no focal motor/sensory deficits EXTREMITIES: No lower extremity edema  LABORATORY DATA:  I have reviewed the data as listed CMP Latest Ref Rng & Units 02/08/2018  Glucose 70 - 140 mg/dL 193(H)  BUN 7 - 26 mg/dL 21  Creatinine 0.60 - 1.10 mg/dL 0.90  Sodium 136 - 145 mmol/L 140  Potassium 3.5 - 5.1 mmol/L 4.0  Chloride 98 - 109 mmol/L 105  CO2 22 - 29 mmol/L 26  Calcium 8.4 - 10.4 mg/dL 9.5  Total Protein 6.4 - 8.3 g/dL 6.8  Total Bilirubin 0.2 - 1.2 mg/dL 0.5  Alkaline Phos 40 - 150 U/L 47  AST 5 - 34 U/L 22  ALT 0 - 55 U/L 24    Lab Results  Component Value Date   WBC 6.7 02/08/2018   HGB 13.1 02/08/2018   HCT 39.8 02/08/2018   MCV 86.1 02/08/2018   PLT 179 02/08/2018   NEUTROABS 4.3 02/08/2018    ASSESSMENT & PLAN:  Ductal carcinoma in situ (DCIS) of left breast 02/27/2018:Left lumpectomy: DCIS with calcifications, intermediate grade, 0.6 cm, margins negative, ER 100%, PR 100%, Tis N0 stage 0  I discussed the final pathology with the patient and provided with a copy of this report plan:  Treatment plan : 1.  Adjuvant radiation therapy 2. followed by adjuvant antiestrogen therapy with anastrozole 1 mg daily daily x5 years  Anastrozole counseling: We discussed the risks and benefits of anti-estrogen therapy with aromatase inhibitors. These include but not limited to insomnia, hot flashes, mood changes, vaginal dryness, bone density loss, and weight gain. We strongly believe that the benefits far outweigh the risks. Patient understands these risks and consented to starting treatment. Planned treatment duration is 5 years.   return to clinic at the end of radiation    No orders of the defined types were placed in this encounter.  The patient has a good understanding of the overall plan. she agrees with it. she will call with any problems that may develop before the next visit here.   Harriette Ohara, MD 03/09/18

## 2018-03-09 NOTE — Assessment & Plan Note (Signed)
02/27/2018:Left lumpectomy: DCIS with calcifications, intermediate grade, 0.6 cm, margins negative, ER 100%, PR 100%, Tis N0 stage 0  I discussed the final pathology with the patient and provided with a copy of this report plan:  Treatment plan : 1.  Adjuvant radiation therapy 2. followed by adjuvant antiestrogen therapy with tamoxifen 20 mg daily x5 years  return to clinic at the end of radiation

## 2018-03-13 ENCOUNTER — Ambulatory Visit (HOSPITAL_COMMUNITY): Payer: Medicare HMO | Admitting: Nurse Practitioner

## 2018-03-14 ENCOUNTER — Encounter: Payer: Self-pay | Admitting: Radiation Oncology

## 2018-03-14 ENCOUNTER — Ambulatory Visit
Admission: RE | Admit: 2018-03-14 | Discharge: 2018-03-14 | Disposition: A | Discharge status: Home / Self Care | Payer: Medicare HMO | Source: Ambulatory Visit | Attending: Radiation Oncology | Admitting: Radiation Oncology

## 2018-03-14 ENCOUNTER — Other Ambulatory Visit: Payer: Self-pay

## 2018-03-14 VITALS — BP 116/70 | HR 64 | Temp 97.9°F | Resp 16 | Wt 149.2 lb

## 2018-03-14 VITALS — BP 116/70 | HR 64 | Temp 97.9°F | Resp 16 | Ht 64.0 in | Wt 149.2 lb

## 2018-03-14 DIAGNOSIS — Z9889 Other specified postprocedural states: Secondary | ICD-10-CM | POA: Diagnosis not present

## 2018-03-14 DIAGNOSIS — D0512 Intraductal carcinoma in situ of left breast: Secondary | ICD-10-CM | POA: Diagnosis not present

## 2018-03-14 DIAGNOSIS — Z170 Estrogen receptor positive status [ER+]: Secondary | ICD-10-CM | POA: Insufficient documentation

## 2018-03-14 NOTE — Progress Notes (Addendum)
Radiation Oncology         [phone] ________________________________  Name: Dorothy Herring MRN: [id number]  Date: 03/14/2018  DOB: 02/06/1941  Follow-Up Visit Note  Outpatient  CC: Shon Baton, MD  Nicholas Lose, MD  Diagnosis:      ICD-10-CM   1. Ductal carcinoma in situ (DCIS) of left breast D05.12      Stage 0, cTisN0 Left Breast central Ductal Carcinoma In Situ, ER(+) / PR(+), Intermediate Grade  CHIEF COMPLAINT: Here to discuss management of left breast DCIS  Narrative:  The patient returns today for follow-up after being seen on 02/08/2018 at breast clinic.     Breast or nodal biopsies, since consultation, involved (dates and results as follows):   On 02/27/2018 She underwent a left breast lumpectomy showing ductal carcinoma IN-SITU with calcifications, intermediate grade, spanning 0.6 cm. Surgical resection margins are negative for carcinoma. She also underwent a Left, new lateral margin breast excision, showing fibrocystic changes with calcifications. There is no evidence of malignancy. ER status: 100% positive, strong; PR status 100% positive, moderate; Intermediate Grade.   She denies arm mobility issues or lymphedema.          ALLERGIES:  has No Known Allergies.  Meds: Current Outpatient Medications  Medication Sig Dispense Refill  . apixaban (ELIQUIS) 5 MG TABS tablet Take 1 tablet (5 mg total) by mouth 2 (two) times daily. 60 tablet 0  . empagliflozin (JARDIANCE) 10 MG TABS tablet Take 10 mg by mouth daily.    Marland Kitchen gabapentin (NEURONTIN) 300 MG capsule Take 300 mg by mouth 2 (two) times daily.    . insulin glargine (LANTUS) 100 UNIT/ML injection Inject 24 Units into the skin at bedtime.    . rosuvastatin (CRESTOR) 40 MG tablet Take 40 mg by mouth daily.  0  . benzonatate (TESSALON) 200 MG capsule TAKE ONE CAPSULE BY MOUTH UP TO THREE TIMES A DAY FOR COUGH  0  . HYDROcodone-acetaminophen (NORCO/VICODIN) 5-325 MG tablet Take 1-2 tablets by mouth every 6 (six) hours  as needed for moderate pain. (Patient not taking: Reported on 03/14/2018) 15 tablet 0  . ondansetron (ZOFRAN) 4 MG tablet Take 4 mg by mouth every 6 (six) hours as needed. for nausea  0   No current facility-administered medications for this encounter.     Physical Findings:  height is 5\' 4"  (1.626 m) and weight is 149 lb 3.2 oz (67.7 kg). Her temperature is 97.9 F (36.6 C). Her blood pressure is 116/70 and her pulse is 64. Her respiration is 16 and oxygen saturation is 100%. .     General: Alert and oriented, in no acute distress Heart: Regular in rate, irregular rhythm with no murmurs, rubs, or gallops. Chest: Clear to auscultation bilaterally, with no rhonchi, wheezes, or rales. Extremities: No cyanosis or edema. Musculoskeletal: symmetric strength and muscle tone throughout. Neurologic: No obvious focalities. Speech is fluent.  Psychiatric: Judgment and insight are intact. Affect is appropriate. Breast exam reveals lumpectomy scar healing well in lateral left breast.    Lab Findings: Lab Results  Component Value Date   WBC 6.7 02/08/2018   HGB 13.1 02/08/2018   HCT 39.8 02/08/2018   MCV 86.1 02/08/2018   PLT 179 02/08/2018       Radiographic Findings: No results found.  Impression/Plan: Stage 0, cTisN0 Left Breast central Ductal Carcinoma In Situ, ER(+) / PR(+), Intermediate Grade  We discussed adjuvant radiotherapy today.  I recommend radiotherapy to the left breast in order to reduce  risk of local recurrence by half.  She understands this is optional and it would not improve her life expectancy.  She has never the less interested in proceeding.  The risks, benefits and side effects of this treatment were discussed in detail.  She understands that radiotherapy is associated with skin irritation and fatigue in the acute setting. Late effects can include cosmetic changes and rare injury to internal organs.   She is enthusiastic about proceeding with treatment. A consent form  has been  signed and placed in her chart.  A total of 3 medically necessary complex treatment devices will be fabricated and supervised by me: 2 fields with MLCs for custom blocks to protect heart, and lungs;  and, a Vac-lok. MORE COMPLEX DEVICES MAY BE MADE IN DOSIMETRY FOR FIELD IN FIELD BEAMS FOR DOSE HOMOGENEITY.  I have requested : 3D Simulation which is medically necessary to give adequate dose to at risk tissues while sparing lungs and heart.  I have requested a DVH of the following structures: lungs, heart, left lumpectomy cavity.    The patient will receive 42.56 Gy in 16 fractions to the left breast with 2 fields.  This will not be followed by a boost.  Due to anticipated vacations we will simulate her treatment after July 10 and possibly not start her treatment until mid August.  She is still figuring out her vacations; her start date may be earlier than mid August.  I spent 25 minutes face to face with the patient and more than 50% of that time was spent in counseling and/or coordination of care. _____________________________________   Eppie Gibson, MD  This document serves as a record of services personally performed by Eppie Gibson, MD. It was created on her behalf by Margit Banda, a trained medical scribe. The creation of this record is based on the scribe's personal observations and the provider's statements to them. This document has been checked and approved by the attending provider.

## 2018-03-31 ENCOUNTER — Encounter (HOSPITAL_COMMUNITY): Payer: Self-pay | Admitting: Nurse Practitioner

## 2018-03-31 ENCOUNTER — Ambulatory Visit (HOSPITAL_COMMUNITY)
Admission: RE | Admit: 2018-03-31 | Discharge: 2018-03-31 | Disposition: A | Discharge status: Home / Self Care | Payer: Medicare HMO | Source: Ambulatory Visit | Attending: Nurse Practitioner | Admitting: Nurse Practitioner

## 2018-03-31 VITALS — BP 108/74 | HR 89 | Ht 64.0 in | Wt 149.0 lb

## 2018-03-31 DIAGNOSIS — Z794 Long term (current) use of insulin: Secondary | ICD-10-CM | POA: Insufficient documentation

## 2018-03-31 DIAGNOSIS — E785 Hyperlipidemia, unspecified: Secondary | ICD-10-CM | POA: Diagnosis not present

## 2018-03-31 DIAGNOSIS — Z7901 Long term (current) use of anticoagulants: Secondary | ICD-10-CM | POA: Diagnosis not present

## 2018-03-31 DIAGNOSIS — I4819 Other persistent atrial fibrillation: Secondary | ICD-10-CM

## 2018-03-31 DIAGNOSIS — Z8673 Personal history of transient ischemic attack (TIA), and cerebral infarction without residual deficits: Secondary | ICD-10-CM | POA: Insufficient documentation

## 2018-03-31 DIAGNOSIS — E119 Type 2 diabetes mellitus without complications: Secondary | ICD-10-CM | POA: Diagnosis not present

## 2018-03-31 DIAGNOSIS — I481 Persistent atrial fibrillation: Secondary | ICD-10-CM | POA: Diagnosis not present

## 2018-03-31 LAB — CBC
HEMATOCRIT: 41 % (ref 36.0–46.0)
Hemoglobin: 13.3 g/dL (ref 12.0–15.0)
MCH: 28.2 pg (ref 26.0–34.0)
MCHC: 32.4 g/dL (ref 30.0–36.0)
MCV: 86.9 fL (ref 78.0–100.0)
Platelets: 171 10*3/uL (ref 150–400)
RBC: 4.72 MIL/uL (ref 3.87–5.11)
RDW: 12.8 % (ref 11.5–15.5)
WBC: 7.3 10*3/uL (ref 4.0–10.5)

## 2018-03-31 LAB — BASIC METABOLIC PANEL
ANION GAP: 9 (ref 5–15)
BUN: 19 mg/dL (ref 8–23)
CO2: 25 mmol/L (ref 22–32)
Calcium: 9.6 mg/dL (ref 8.9–10.3)
Chloride: 104 mmol/L (ref 98–111)
Creatinine, Ser: 0.99 mg/dL (ref 0.44–1.00)
GFR calc Af Amer: >60 mL/min (ref >60)
GFR calc non Af Amer: 55 mL/min — ABNORMAL LOW (ref >60)
GLUCOSE: 374 mg/dL — AB (ref 70–99)
POTASSIUM: 4.4 mmol/L (ref 3.5–5.1)
Sodium: 138 mmol/L (ref 135–145)

## 2018-03-31 MED ORDER — APIXABAN 5 MG PO TABS: 5.0000 mg | ORAL_TABLET | Freq: Two times a day (BID) | ORAL | 3 refills | Status: DC

## 2018-03-31 NOTE — Progress Notes (Signed)
Electrophysiology Office Note   Date:  03/31/2018   ID:  Radhika, Dershem [birth year], MRN [id number]  PCP:  Shon Baton, MD  Cardiologist:  none Primary Electrophysiologist: Roderic Palau, NP    CC: afib   History of Present Illness: Dorothy Herring is a 77 y.o. female who presents today for "bruising on arms while at beach."  She has been recently diagnosed with atrial fibrillation (AF clinic notes reviewed).  She is unaware.  She had her breast surgery several weeks ago and is healing well. She saw Dr. Rayann Heman  6/17 and eliquis was started at that point. He mentioned trying cardioversion after 3 weeks anticoagulation.  She states while she was at the beach she noted bruising of arms, so she reduced eliquis to one tab a day. Today, bruising has resolved. She did have around 3 alcoholic drinks while at the beach. She is still asymptomatic with afib.  Today, she denies symptoms of palpitations, chest pain, shortness of breath, orthopnea, PND, lower extremity edema, claudication, dizziness, presyncope, syncope, bleeding, or neurologic sequela. The patient is tolerating medications without difficulties and is otherwise without complaint today.    Past Medical History:  Diagnosis Date  . Cancer ([place])   . Diabetes mellitus   . Hyperlipidemia   . Persistent atrial fibrillation ([place])   . TIA (transient ischemic attack)    seven years ago   Past Surgical History:  Procedure Laterality Date  . ABDOMINAL HYSTERECTOMY    . BREAST BIOPSY    . BREAST LUMPECTOMY WITH RADIOACTIVE SEED LOCALIZATION Left 02/27/2018   Procedure: BREAST LUMPECTOMY WITH RADIOACTIVE SEED LOCALIZATION;  Surgeon: Alphonsa Overall, MD;  Location: [facility];  Service: General;  Laterality: Left;     Current Outpatient Medications  Medication Sig Dispense Refill  . apixaban (ELIQUIS) 5 MG TABS tablet Take 1 tablet (5 mg total) by mouth 2 (two) times daily. 60 tablet 3  . empagliflozin (JARDIANCE) 10 MG  TABS tablet Take 10 mg by mouth daily.    Marland Kitchen gabapentin (NEURONTIN) 300 MG capsule Take 300 mg by mouth 2 (two) times daily.    . insulin glargine (LANTUS) 100 UNIT/ML injection Inject 24 Units into the skin at bedtime.    . rosuvastatin (CRESTOR) 40 MG tablet Take 40 mg by mouth daily.  0   No current facility-administered medications for this encounter.     Allergies:   Patient has no known allergies.   Social History:  The patient  reports that she has never smoked. She has never used smokeless tobacco. She reports that she does not drink alcohol or use drugs.   Family History:  The patient's  family history includes Alzheimer's disease in her mother; Lung cancer in her father.    ROS:  Please see the history of present illness.   All other systems are personally reviewed and negative.    PHYSICAL EXAM: VS:  BP 108/74 (BP Location: Left Arm, Patient Position: Sitting, Cuff Size: Normal)   Pulse 89   Ht 5\' 4"  (1.626 m)   Wt 149 lb (67.6 kg)   BMI 25.58 kg/m  , BMI Body mass index is 25.58 kg/m. GEN: Well nourished, well developed, in no acute distress  HEENT: normal  Neck: no JVD, carotid bruits, or masses Cardiac: iRRR; no murmurs, rubs, or gallops,no edema  Respiratory:  clear to auscultation bilaterally, normal work of breathing GI: soft, nontender, nondistended, + BS MS: no deformity or atrophy  Skin: warm and dry  Neuro:  Strength and sensation are intact Psych: euthymic mood, full affect  EKG:  EKG is ordered today. The ekg ordered today is personally reviewed and shows afib, V rate 89 bpm   Recent Labs: 02/08/2018: ALT 24 03/31/2018: BUN 19; Creatinine, Ser 0.99; Hemoglobin 13.3; Platelets 171; Potassium 4.4; Sodium 138  personally reviewed   Lipid Panel  No results found for: CHOL, TRIG, HDL, CHOLHDL, VLDL, LDLCALC, LDLDIRECT personally reviewed   Wt Readings from Last 3 Encounters:  03/31/18 149 lb (67.6 kg)  03/14/18 149 lb 3.2 oz (67.7 kg)  03/14/18 149  lb 3.2 oz (67.7 kg)      Other studies personally reviewed: Additional studies/ records that were reviewed today include: AF clinic notes, recent echo  Review of the above records today demonstrates: as above   ASSESSMENT AND PLAN:  1.  Persistent afib Asymptomatic Rate controlled chads2vasc score is at least 5( will be six in September when she turns 77).  I have advised that she continue  eliquis as previously instructed, 5 mg bid as she is putting herself at risk not taking correctly. No bruising is noted at this point. CBC, bmet today. Could consider cardioversion without AAD once appropriately anticoagulated( 3 weeks from today if pt takes correctly   Follow-up:  AF clinic in 2 weeks  Current medicines are reviewed at length with the patient today.   The patient does not have concerns regarding her medicines.  The following changes were made today:  none  Labs/ tests ordered today include:  Orders Placed This Encounter  Procedures  . CBC  . Basic metabolic panel  . EKG 12-Lead     Signed, Geroge Baseman. Emmeline Winebarger, [facility] [address] [place],  [address] [phone]

## 2018-03-31 NOTE — Patient Instructions (Signed)
Eliquis 5 mg twice a day.

## 2018-04-05 DIAGNOSIS — R69 Illness, unspecified: Secondary | ICD-10-CM | POA: Diagnosis not present

## 2018-04-05 NOTE — Addendum Note (Signed)
Encounter addended by: Eppie Gibson, MD on: 04/05/2018 6:45 PM  Actions taken: Sign clinical note

## 2018-04-10 ENCOUNTER — Ambulatory Visit
Admission: RE | Admit: 2018-04-10 | Discharge: 2018-04-10 | Disposition: A | Discharge status: Home / Self Care | Payer: Medicare HMO | Source: Ambulatory Visit | Attending: Radiation Oncology | Admitting: Radiation Oncology

## 2018-04-10 DIAGNOSIS — Z510 Encounter for antineoplastic radiation therapy: Secondary | ICD-10-CM | POA: Diagnosis present

## 2018-04-10 DIAGNOSIS — D0512 Intraductal carcinoma in situ of left breast: Secondary | ICD-10-CM

## 2018-04-10 DIAGNOSIS — H401131 Primary open-angle glaucoma, bilateral, mild stage: Secondary | ICD-10-CM | POA: Diagnosis not present

## 2018-04-10 NOTE — Progress Notes (Signed)
Radiation Oncology         [phone] ________________________________  Name: Dorothy Herring MRN: [id number]  Date: 04/10/2018  DOB: [birth year]  SIMULATION AND TREATMENT PLANNING NOTE  // special treatment procedure  Outpatient  DIAGNOSIS:     ICD-10-CM   1. Ductal carcinoma in situ (DCIS) of left breast D05.12     NARRATIVE:  The patient was brought to the [facility].  Identity was confirmed.  All relevant records and images related to the planned course of therapy were reviewed.  The patient freely provided informed written consent to proceed with treatment after reviewing the details related to the planned course of therapy. The consent form was witnessed and verified by the simulation staff.    Then, the patient was set-up in a stable reproducible supine position for radiation therapy with her ipsilateral arm over her head, and her upper body secured in a custom-made Vac-lok device.  CT images were obtained.  Surface markings were placed.  The CT images were loaded into the planning software.    Special treatment procedure:  Special treatment procedure was performed today due to the extra time and effort required by myself to plan and prepare this patient for deep inspiration breath hold technique.  I have determined cardiac sparing to be of benefit to this patient to prevent long term cardiac damage due to radiation of the heart.  Bellows were placed on the patient's abdomen. To facilitate cardiac sparing, the patient was coached by the radiation therapists on breath hold techniques and breathing practice was performed. Practice waveforms were obtained. The patient was then scanned while maintaining breath hold in the treatment position.  This image was then transferred over to the imaging specialist. The imaging specialist then created a fusion of the free breathing and breath hold scans using the chest wall as the stable structure. I personally reviewed the fusion in  axial, coronal and sagittal image planes.  Excellent cardiac sparing was obtained.  I felt the patient is an appropriate candidate for breath hold and the patient will be treated as such.  The image fusion was then reviewed with the patient to reinforce the necessity of reproducible breath hold.  TREATMENT PLANNING NOTE: Treatment planning then occurred.  The radiation prescription was entered and confirmed.     A total of 3 medically necessary complex treatment devices were fabricated and supervised by me: 2 fields with MLCs for custom blocks to protect heart, and lungs;  and, a Vac-lok. MORE COMPLEX DEVICES MAY BE MADE IN DOSIMETRY FOR FIELD IN FIELD BEAMS FOR DOSE HOMOGENEITY.  I have requested : 3D Simulation which is medically necessary to give adequate dose to at risk tissues while sparing lungs and heart.  I have requested a DVH of the following structures: lungs, heart, lumpectomy cavity (left).    The patient will receive 42.56 Gy in 16 fractions to the left breast with 2 tangential fields.   This will not be followed by a boost.  Optical Surface Tracking Plan:  Since intensity modulated radiotherapy (IMRT) and 3D conformal radiation treatment methods are predicated on accurate and precise positioning for treatment, intrafraction motion monitoring is medically necessary to ensure accurate and safe treatment delivery. The ability to quantify intrafraction motion without excessive ionizing radiation dose can only be performed with optical surface tracking. Accordingly, surface imaging offers the opportunity to obtain 3D measurements of patient position throughout IMRT and 3D treatments without excessive radiation exposure. I am ordering optical surface tracking  for this patient's upcoming course of radiotherapy.  ________________________________   Reference:  Particia Jasper, et al. Surface imaging-based analysis of intrafraction motion for breast radiotherapy patients.Journal of  [place], n. 6, nov. 2014. ISSN [id number].  Available at: <http://www.jacmp.org/index.php/jacmp/article/view/4957>.    -----------------------------------  Eppie Gibson, MD

## 2018-04-11 DIAGNOSIS — Z510 Encounter for antineoplastic radiation therapy: Secondary | ICD-10-CM | POA: Diagnosis not present

## 2018-04-11 DIAGNOSIS — D0512 Intraductal carcinoma in situ of left breast: Secondary | ICD-10-CM | POA: Diagnosis not present

## 2018-04-14 ENCOUNTER — Ambulatory Visit: Payer: Medicare HMO | Admitting: Radiation Oncology

## 2018-04-14 DIAGNOSIS — I10 Essential (primary) hypertension: Secondary | ICD-10-CM | POA: Diagnosis not present

## 2018-04-14 DIAGNOSIS — E118 Type 2 diabetes mellitus with unspecified complications: Secondary | ICD-10-CM | POA: Diagnosis not present

## 2018-04-14 DIAGNOSIS — I4892 Unspecified atrial flutter: Secondary | ICD-10-CM | POA: Diagnosis not present

## 2018-04-14 DIAGNOSIS — Z794 Long term (current) use of insulin: Secondary | ICD-10-CM | POA: Diagnosis not present

## 2018-04-14 DIAGNOSIS — L209 Atopic dermatitis, unspecified: Secondary | ICD-10-CM | POA: Diagnosis not present

## 2018-04-17 ENCOUNTER — Ambulatory Visit (HOSPITAL_COMMUNITY)
Admission: RE | Admit: 2018-04-17 | Discharge: 2018-04-17 | Disposition: A | Discharge status: Home / Self Care | Payer: Medicare HMO | Source: Ambulatory Visit | Attending: Nurse Practitioner | Admitting: Nurse Practitioner

## 2018-04-17 ENCOUNTER — Telehealth: Payer: Self-pay | Admitting: Hematology and Oncology

## 2018-04-17 ENCOUNTER — Encounter (HOSPITAL_COMMUNITY): Payer: Self-pay | Admitting: Nurse Practitioner

## 2018-04-17 VITALS — BP 116/64 | HR 86 | Ht 64.0 in | Wt 148.0 lb

## 2018-04-17 DIAGNOSIS — I481 Persistent atrial fibrillation: Secondary | ICD-10-CM | POA: Insufficient documentation

## 2018-04-17 DIAGNOSIS — Z90710 Acquired absence of both cervix and uterus: Secondary | ICD-10-CM | POA: Diagnosis not present

## 2018-04-17 DIAGNOSIS — Z79899 Other long term (current) drug therapy: Secondary | ICD-10-CM | POA: Insufficient documentation

## 2018-04-17 DIAGNOSIS — Z794 Long term (current) use of insulin: Secondary | ICD-10-CM | POA: Insufficient documentation

## 2018-04-17 DIAGNOSIS — E785 Hyperlipidemia, unspecified: Secondary | ICD-10-CM | POA: Insufficient documentation

## 2018-04-17 DIAGNOSIS — Z7901 Long term (current) use of anticoagulants: Secondary | ICD-10-CM | POA: Diagnosis not present

## 2018-04-17 DIAGNOSIS — Z801 Family history of malignant neoplasm of trachea, bronchus and lung: Secondary | ICD-10-CM | POA: Insufficient documentation

## 2018-04-17 DIAGNOSIS — Z820 Family history of epilepsy and other diseases of the nervous system: Secondary | ICD-10-CM | POA: Insufficient documentation

## 2018-04-17 DIAGNOSIS — I4819 Other persistent atrial fibrillation: Secondary | ICD-10-CM

## 2018-04-17 DIAGNOSIS — I4891 Unspecified atrial fibrillation: Secondary | ICD-10-CM | POA: Diagnosis present

## 2018-04-17 DIAGNOSIS — E119 Type 2 diabetes mellitus without complications: Secondary | ICD-10-CM | POA: Diagnosis not present

## 2018-04-17 DIAGNOSIS — Z8673 Personal history of transient ischemic attack (TIA), and cerebral infarction without residual deficits: Secondary | ICD-10-CM | POA: Insufficient documentation

## 2018-04-17 NOTE — Telephone Encounter (Signed)
Per 7/29 sch msg [facility]).  Called patient w/ added appt w/ Dr. Lindi Adie.

## 2018-04-17 NOTE — Progress Notes (Signed)
Electrophysiology Office Note   Date:  04/17/2018   ID:  Dorothy Herring, Dorothy Herring Sep 23, 1940, MRN [id number]  PCP:  Shon Baton, MD  Cardiologist:  none Primary Electrophysiologist:Dr. Rayann Heman     CC: afib   History of Present Illness: Dorothy Herring is a 77 y.o. female who presents today for "bruising on arms while at beach."  She has been recently diagnosed with atrial fibrillation. She is unaware.  She had her breast surgery for lump several weeks ago and is healing well. She is due to startr radiation in a few weeks. She saw Dr. Rayann Heman  6/17 and eliquis was started at that point. He mentioned trying cardioversion after 3 weeks anticoagulation.  She states while she was at the beach she noted bruising of arms, so she reduced eliquis to one tab a day. Today, bruising has resolved. She did have around 3 alcoholic drinks while at the beach. She is still asymptomatic with afib.  F/u in fib clinic, she is back to bid eliquis without any bruising of arms noted. We did discuss cardioversion but at this point, she would rather wait until after her radiation is finished which will take about one month tp pursue.  Today, she denies symptoms of palpitations, chest pain, shortness of breath, orthopnea, PND, lower extremity edema, claudication, dizziness, presyncope, syncope, bleeding, or neurologic sequela. The patient is tolerating medications without difficulties and is otherwise without complaint today.    Past Medical History:  Diagnosis Date  . Cancer ([place])   . Diabetes mellitus   . Hyperlipidemia   . Persistent atrial fibrillation ([place])   . TIA (transient ischemic attack)    seven years ago   Past Surgical History:  Procedure Laterality Date  . ABDOMINAL HYSTERECTOMY    . BREAST BIOPSY    . BREAST LUMPECTOMY WITH RADIOACTIVE SEED LOCALIZATION Left 02/27/2018   Procedure: BREAST LUMPECTOMY WITH RADIOACTIVE SEED LOCALIZATION;  Surgeon: Alphonsa Overall, MD;  Location: [facility];  Service: General;  Laterality: Left;     Current Outpatient Medications  Medication Sig Dispense Refill  . apixaban (ELIQUIS) 5 MG TABS tablet Take 1 tablet (5 mg total) by mouth 2 (two) times daily. 60 tablet 3  . empagliflozin (JARDIANCE) 10 MG TABS tablet Take 10 mg by mouth daily.    Marland Kitchen gabapentin (NEURONTIN) 300 MG capsule Take 300 mg by mouth 2 (two) times daily.    . insulin glargine (LANTUS) 100 UNIT/ML injection Inject 24 Units into the skin at bedtime.    Marland Kitchen LUMIGAN 0.01 % SOLN     . rosuvastatin (CRESTOR) 40 MG tablet Take 40 mg by mouth daily.  0   No current facility-administered medications for this encounter.     Allergies:   Patient has no known allergies.   Social History:  The patient  reports that she has never smoked. She has never used smokeless tobacco. She reports that she does not drink alcohol or use drugs.   Family History:  The patient's  family history includes Alzheimer's disease in her mother; Lung cancer in her father.    ROS:  Please see the history of present illness.   All other systems are personally reviewed and negative.    PHYSICAL EXAM: VS:  BP 116/64 (BP Location: Left Arm, Patient Position: Sitting, Cuff Size: Normal)   Pulse 86   Ht 5\' 4"  (1.626 m)   Wt 148 lb (67.1 kg)   BMI 25.40 kg/m  , BMI Body mass index  is 25.4 kg/m. GEN: Well nourished, well developed, in no acute distress  HEENT: normal  Neck: no JVD, carotid bruits, or masses Cardiac: iRRR; no murmurs, rubs, or gallops,no edema  Respiratory:  clear to auscultation bilaterally, normal work of breathing GI: soft, nontender, nondistended, + BS MS: no deformity or atrophy  Skin: warm and dry  Neuro:  Strength and sensation are intact Psych: euthymic mood, full affect  EKG:  EKG is ordered today. The ekg ordered today is personally reviewed and shows afib, V rate 86 bpm   Recent Labs: 02/08/2018: ALT 24 03/31/2018: BUN 19; Creatinine, Ser 0.99; Hemoglobin 13.3;  Platelets 171; Potassium 4.4; Sodium 138  personally reviewed   Lipid Panel  No results found for: CHOL, TRIG, HDL, CHOLHDL, VLDL, LDLCALC, LDLDIRECT personally reviewed   Wt Readings from Last 3 Encounters:  04/17/18 148 lb (67.1 kg)  03/31/18 149 lb (67.6 kg)  03/14/18 149 lb 3.2 oz (67.7 kg)      Other studies personally reviewed: Additional studies/ records that were reviewed today include: AF clinic notes, recent echo  Review of the above records today demonstrates: as above   ASSESSMENT AND PLAN:  1.  Persistent afib Asymptomatic Rate controlled chads2vasc score is at least 5( will be six in September when she turns 77).  I have advised that she continue  eliquis as previously instructed, 5 mg bid as she is putting herself at risk not taking correctly. No bruising noted with increase back to bid. Could consider cardioversion without AAD once appropriately anticoagulated( 3 weeks from today if pt takes correctly  However at this point pt wants to wait until she finishes radiation.  Follow-up:I will see back end of September to further discuss    Labs/ tests ordered today include:  Orders Placed This Encounter  Procedures  . EKG 12-Lead     Geroge Baseman. Natally Ribera, [facility] [address] [place], [place] [address] [phone]

## 2018-05-01 ENCOUNTER — Ambulatory Visit
Admission: RE | Admit: 2018-05-01 | Discharge: 2018-05-01 | Disposition: A | Discharge status: Home / Self Care | Payer: Medicare HMO | Source: Ambulatory Visit | Attending: Radiation Oncology | Admitting: Radiation Oncology

## 2018-05-01 DIAGNOSIS — D0512 Intraductal carcinoma in situ of left breast: Secondary | ICD-10-CM | POA: Diagnosis not present

## 2018-05-01 DIAGNOSIS — Z510 Encounter for antineoplastic radiation therapy: Secondary | ICD-10-CM | POA: Insufficient documentation

## 2018-05-01 DIAGNOSIS — D0511 Intraductal carcinoma in situ of right breast: Secondary | ICD-10-CM | POA: Diagnosis not present

## 2018-05-02 ENCOUNTER — Ambulatory Visit
Admission: RE | Admit: 2018-05-02 | Discharge: 2018-05-02 | Disposition: A | Discharge status: Home / Self Care | Payer: Medicare HMO | Source: Ambulatory Visit | Attending: Radiation Oncology | Admitting: Radiation Oncology

## 2018-05-02 DIAGNOSIS — D0511 Intraductal carcinoma in situ of right breast: Secondary | ICD-10-CM | POA: Diagnosis not present

## 2018-05-02 DIAGNOSIS — Z510 Encounter for antineoplastic radiation therapy: Secondary | ICD-10-CM | POA: Diagnosis not present

## 2018-05-02 DIAGNOSIS — D0512 Intraductal carcinoma in situ of left breast: Secondary | ICD-10-CM | POA: Diagnosis not present

## 2018-05-03 ENCOUNTER — Ambulatory Visit
Admission: RE | Admit: 2018-05-03 | Discharge: 2018-05-03 | Disposition: A | Discharge status: Home / Self Care | Payer: Medicare HMO | Source: Ambulatory Visit | Attending: Radiation Oncology | Admitting: Radiation Oncology

## 2018-05-03 DIAGNOSIS — Z510 Encounter for antineoplastic radiation therapy: Secondary | ICD-10-CM | POA: Diagnosis not present

## 2018-05-03 DIAGNOSIS — D0512 Intraductal carcinoma in situ of left breast: Secondary | ICD-10-CM | POA: Diagnosis not present

## 2018-05-03 DIAGNOSIS — D0511 Intraductal carcinoma in situ of right breast: Secondary | ICD-10-CM | POA: Diagnosis not present

## 2018-05-04 ENCOUNTER — Ambulatory Visit
Admission: RE | Admit: 2018-05-04 | Discharge: 2018-05-04 | Disposition: A | Discharge status: Home / Self Care | Payer: Medicare HMO | Source: Ambulatory Visit | Attending: Radiation Oncology | Admitting: Radiation Oncology

## 2018-05-04 DIAGNOSIS — D0511 Intraductal carcinoma in situ of right breast: Secondary | ICD-10-CM | POA: Diagnosis not present

## 2018-05-04 DIAGNOSIS — D0512 Intraductal carcinoma in situ of left breast: Secondary | ICD-10-CM | POA: Diagnosis not present

## 2018-05-04 DIAGNOSIS — Z510 Encounter for antineoplastic radiation therapy: Secondary | ICD-10-CM | POA: Diagnosis not present

## 2018-05-05 ENCOUNTER — Ambulatory Visit
Admission: RE | Admit: 2018-05-05 | Discharge: 2018-05-05 | Disposition: A | Discharge status: Home / Self Care | Payer: Medicare HMO | Source: Ambulatory Visit | Attending: Radiation Oncology | Admitting: Radiation Oncology

## 2018-05-05 DIAGNOSIS — Z510 Encounter for antineoplastic radiation therapy: Secondary | ICD-10-CM | POA: Diagnosis not present

## 2018-05-05 DIAGNOSIS — D0512 Intraductal carcinoma in situ of left breast: Secondary | ICD-10-CM | POA: Diagnosis not present

## 2018-05-05 DIAGNOSIS — D0511 Intraductal carcinoma in situ of right breast: Secondary | ICD-10-CM | POA: Diagnosis not present

## 2018-05-06 DIAGNOSIS — S63502A Unspecified sprain of left wrist, initial encounter: Secondary | ICD-10-CM | POA: Diagnosis not present

## 2018-05-07 ENCOUNTER — Ambulatory Visit: Admission: RE | Admit: 2018-05-07 | Payer: Medicare HMO | Source: Ambulatory Visit

## 2018-05-08 ENCOUNTER — Other Ambulatory Visit: Payer: Self-pay | Admitting: Radiation Oncology

## 2018-05-08 ENCOUNTER — Ambulatory Visit
Admission: RE | Admit: 2018-05-08 | Discharge: 2018-05-08 | Disposition: A | Discharge status: Home / Self Care | Payer: Medicare HMO | Source: Ambulatory Visit | Attending: Radiation Oncology | Admitting: Radiation Oncology

## 2018-05-08 ENCOUNTER — Telehealth (HOSPITAL_COMMUNITY): Payer: Self-pay

## 2018-05-08 DIAGNOSIS — D0512 Intraductal carcinoma in situ of left breast: Secondary | ICD-10-CM

## 2018-05-08 DIAGNOSIS — D0511 Intraductal carcinoma in situ of right breast: Secondary | ICD-10-CM | POA: Diagnosis not present

## 2018-05-08 DIAGNOSIS — Z510 Encounter for antineoplastic radiation therapy: Secondary | ICD-10-CM | POA: Diagnosis not present

## 2018-05-08 MED ORDER — ALRA NON-METALLIC DEODORANT (RAD-ONC)
1.0000 "application " | Freq: Once | TOPICAL | Status: AC
  Administered 2018-05-08: 1 via TOPICAL

## 2018-05-08 MED ORDER — RADIAPLEXRX EX GEL
Freq: Once | CUTANEOUS | Status: AC
  Administered 2018-05-08: 15:00:00 via TOPICAL

## 2018-05-08 NOTE — Telephone Encounter (Signed)
Pt was approved for patient assistant  05/04/2018-09/19/2018  For eliquis   From [place] .

## 2018-05-08 NOTE — Progress Notes (Signed)

## 2018-05-09 ENCOUNTER — Ambulatory Visit
Admission: RE | Admit: 2018-05-09 | Discharge: 2018-05-09 | Disposition: A | Discharge status: Home / Self Care | Payer: Medicare HMO | Source: Ambulatory Visit | Attending: Radiation Oncology | Admitting: Radiation Oncology

## 2018-05-09 DIAGNOSIS — D0511 Intraductal carcinoma in situ of right breast: Secondary | ICD-10-CM | POA: Diagnosis not present

## 2018-05-09 DIAGNOSIS — D0512 Intraductal carcinoma in situ of left breast: Secondary | ICD-10-CM | POA: Diagnosis not present

## 2018-05-09 DIAGNOSIS — Z510 Encounter for antineoplastic radiation therapy: Secondary | ICD-10-CM | POA: Diagnosis not present

## 2018-05-10 ENCOUNTER — Ambulatory Visit
Admission: RE | Admit: 2018-05-10 | Discharge: 2018-05-10 | Disposition: A | Discharge status: Home / Self Care | Payer: Medicare HMO | Source: Ambulatory Visit | Attending: Radiation Oncology | Admitting: Radiation Oncology

## 2018-05-10 DIAGNOSIS — D0512 Intraductal carcinoma in situ of left breast: Secondary | ICD-10-CM | POA: Diagnosis not present

## 2018-05-10 DIAGNOSIS — D0511 Intraductal carcinoma in situ of right breast: Secondary | ICD-10-CM | POA: Diagnosis not present

## 2018-05-10 DIAGNOSIS — Z510 Encounter for antineoplastic radiation therapy: Secondary | ICD-10-CM | POA: Diagnosis not present

## 2018-05-11 ENCOUNTER — Ambulatory Visit
Admission: RE | Admit: 2018-05-11 | Discharge: 2018-05-11 | Disposition: A | Discharge status: Home / Self Care | Payer: Medicare HMO | Source: Ambulatory Visit | Attending: Radiation Oncology | Admitting: Radiation Oncology

## 2018-05-11 DIAGNOSIS — Z510 Encounter for antineoplastic radiation therapy: Secondary | ICD-10-CM | POA: Diagnosis not present

## 2018-05-11 DIAGNOSIS — D0512 Intraductal carcinoma in situ of left breast: Secondary | ICD-10-CM | POA: Diagnosis not present

## 2018-05-11 DIAGNOSIS — D0511 Intraductal carcinoma in situ of right breast: Secondary | ICD-10-CM | POA: Diagnosis not present

## 2018-05-12 ENCOUNTER — Ambulatory Visit
Admission: RE | Admit: 2018-05-12 | Discharge: 2018-05-12 | Disposition: A | Discharge status: Home / Self Care | Payer: Medicare HMO | Source: Ambulatory Visit | Attending: Radiation Oncology | Admitting: Radiation Oncology

## 2018-05-12 DIAGNOSIS — Z510 Encounter for antineoplastic radiation therapy: Secondary | ICD-10-CM | POA: Diagnosis not present

## 2018-05-12 DIAGNOSIS — D0512 Intraductal carcinoma in situ of left breast: Secondary | ICD-10-CM | POA: Diagnosis not present

## 2018-05-12 DIAGNOSIS — D0511 Intraductal carcinoma in situ of right breast: Secondary | ICD-10-CM | POA: Diagnosis not present

## 2018-05-15 ENCOUNTER — Ambulatory Visit
Admission: RE | Admit: 2018-05-15 | Discharge: 2018-05-15 | Disposition: A | Discharge status: Home / Self Care | Payer: Medicare HMO | Source: Ambulatory Visit | Attending: Radiation Oncology | Admitting: Radiation Oncology

## 2018-05-15 DIAGNOSIS — Z510 Encounter for antineoplastic radiation therapy: Secondary | ICD-10-CM | POA: Diagnosis not present

## 2018-05-15 DIAGNOSIS — E118 Type 2 diabetes mellitus with unspecified complications: Secondary | ICD-10-CM | POA: Diagnosis not present

## 2018-05-15 DIAGNOSIS — D0511 Intraductal carcinoma in situ of right breast: Secondary | ICD-10-CM | POA: Diagnosis not present

## 2018-05-15 DIAGNOSIS — Z794 Long term (current) use of insulin: Secondary | ICD-10-CM | POA: Diagnosis not present

## 2018-05-15 DIAGNOSIS — D0512 Intraductal carcinoma in situ of left breast: Secondary | ICD-10-CM | POA: Diagnosis not present

## 2018-05-16 ENCOUNTER — Ambulatory Visit
Admission: RE | Admit: 2018-05-16 | Discharge: 2018-05-16 | Disposition: A | Discharge status: Home / Self Care | Payer: Medicare HMO | Source: Ambulatory Visit | Attending: Radiation Oncology | Admitting: Radiation Oncology

## 2018-05-16 DIAGNOSIS — Z510 Encounter for antineoplastic radiation therapy: Secondary | ICD-10-CM | POA: Diagnosis not present

## 2018-05-16 DIAGNOSIS — D0511 Intraductal carcinoma in situ of right breast: Secondary | ICD-10-CM | POA: Diagnosis not present

## 2018-05-16 DIAGNOSIS — D0512 Intraductal carcinoma in situ of left breast: Secondary | ICD-10-CM | POA: Diagnosis not present

## 2018-05-17 ENCOUNTER — Ambulatory Visit
Admission: RE | Admit: 2018-05-17 | Discharge: 2018-05-17 | Disposition: A | Discharge status: Home / Self Care | Payer: Medicare HMO | Source: Ambulatory Visit | Attending: Radiation Oncology | Admitting: Radiation Oncology

## 2018-05-17 DIAGNOSIS — D0511 Intraductal carcinoma in situ of right breast: Secondary | ICD-10-CM | POA: Diagnosis not present

## 2018-05-17 DIAGNOSIS — Z510 Encounter for antineoplastic radiation therapy: Secondary | ICD-10-CM | POA: Diagnosis not present

## 2018-05-17 DIAGNOSIS — D0512 Intraductal carcinoma in situ of left breast: Secondary | ICD-10-CM | POA: Diagnosis not present

## 2018-05-18 ENCOUNTER — Ambulatory Visit
Admission: RE | Admit: 2018-05-18 | Discharge: 2018-05-18 | Disposition: A | Discharge status: Home / Self Care | Payer: Medicare HMO | Source: Ambulatory Visit | Attending: Radiation Oncology | Admitting: Radiation Oncology

## 2018-05-18 DIAGNOSIS — I4892 Unspecified atrial flutter: Secondary | ICD-10-CM | POA: Diagnosis not present

## 2018-05-18 DIAGNOSIS — Z794 Long term (current) use of insulin: Secondary | ICD-10-CM | POA: Diagnosis not present

## 2018-05-18 DIAGNOSIS — I10 Essential (primary) hypertension: Secondary | ICD-10-CM | POA: Diagnosis not present

## 2018-05-18 DIAGNOSIS — Z510 Encounter for antineoplastic radiation therapy: Secondary | ICD-10-CM | POA: Diagnosis not present

## 2018-05-18 DIAGNOSIS — D0511 Intraductal carcinoma in situ of right breast: Secondary | ICD-10-CM | POA: Diagnosis not present

## 2018-05-18 DIAGNOSIS — Z6826 Body mass index (BMI) 26.0-26.9, adult: Secondary | ICD-10-CM | POA: Diagnosis not present

## 2018-05-18 DIAGNOSIS — D0512 Intraductal carcinoma in situ of left breast: Secondary | ICD-10-CM | POA: Diagnosis not present

## 2018-05-18 DIAGNOSIS — E1169 Type 2 diabetes mellitus with other specified complication: Secondary | ICD-10-CM | POA: Diagnosis not present

## 2018-05-18 DIAGNOSIS — E1165 Type 2 diabetes mellitus with hyperglycemia: Secondary | ICD-10-CM | POA: Diagnosis not present

## 2018-05-18 DIAGNOSIS — E11319 Type 2 diabetes mellitus with unspecified diabetic retinopathy without macular edema: Secondary | ICD-10-CM | POA: Diagnosis not present

## 2018-05-19 ENCOUNTER — Ambulatory Visit
Admission: RE | Admit: 2018-05-19 | Discharge: 2018-05-19 | Disposition: A | Discharge status: Home / Self Care | Payer: Medicare HMO | Source: Ambulatory Visit | Attending: Radiation Oncology | Admitting: Radiation Oncology

## 2018-05-19 DIAGNOSIS — D0511 Intraductal carcinoma in situ of right breast: Secondary | ICD-10-CM | POA: Diagnosis not present

## 2018-05-19 DIAGNOSIS — Z510 Encounter for antineoplastic radiation therapy: Secondary | ICD-10-CM | POA: Diagnosis not present

## 2018-05-19 DIAGNOSIS — D0512 Intraductal carcinoma in situ of left breast: Secondary | ICD-10-CM | POA: Diagnosis not present

## 2018-05-23 ENCOUNTER — Ambulatory Visit
Admission: RE | Admit: 2018-05-23 | Discharge: 2018-05-23 | Disposition: A | Discharge status: Home / Self Care | Payer: Medicare HMO | Source: Ambulatory Visit | Attending: Radiation Oncology | Admitting: Radiation Oncology

## 2018-05-23 ENCOUNTER — Telehealth: Payer: Self-pay | Admitting: Hematology and Oncology

## 2018-05-23 ENCOUNTER — Inpatient Hospital Stay: Payer: Medicare HMO | Attending: Hematology and Oncology | Admitting: Hematology and Oncology

## 2018-05-23 DIAGNOSIS — D0512 Intraductal carcinoma in situ of left breast: Secondary | ICD-10-CM | POA: Insufficient documentation

## 2018-05-23 DIAGNOSIS — Z79811 Long term (current) use of aromatase inhibitors: Secondary | ICD-10-CM | POA: Insufficient documentation

## 2018-05-23 DIAGNOSIS — Z510 Encounter for antineoplastic radiation therapy: Secondary | ICD-10-CM | POA: Diagnosis present

## 2018-05-23 DIAGNOSIS — Z170 Estrogen receptor positive status [ER+]: Secondary | ICD-10-CM | POA: Diagnosis not present

## 2018-05-23 MED ORDER — LETROZOLE 2.5 MG PO TABS: 2.5000 mg | ORAL_TABLET | Freq: Every day | ORAL | 3 refills | Status: DC

## 2018-05-23 NOTE — Assessment & Plan Note (Signed)
02/27/2018:Left lumpectomy: DCIS with calcifications, intermediate grade, 0.6 cm, margins negative, ER 100%, PR 100%, Tis N0 stage 0  I discussed the final pathology with the patient and provided with a copy of this report plan:  Treatment plan : 1.  Adjuvant radiation therapy started 05/02/2018 2. followed by adjuvant antiestrogen therapy with anastrozole 1 mg daily daily x5 years  I sent a prescription for anastrozole therapy. Return in 3 months for survivorship care plan visit

## 2018-05-23 NOTE — Progress Notes (Signed)
Patient Care Team: Shon Baton, MD as PCP - General (Internal Medicine) Alphonsa Overall, MD as Consulting Physician (General Surgery) Nicholas Lose, MD as Consulting Physician (Hematology and Oncology) Eppie Gibson, MD as Attending Physician (Radiation Oncology)  DIAGNOSIS:  Encounter Diagnosis  Name Primary?  . Ductal carcinoma in situ (DCIS) of left breast     SUMMARY OF ONCOLOGIC HISTORY:   Ductal carcinoma in situ (DCIS) of left breast   02/01/2018 Initial Diagnosis    Left nipple discharge, mammogram showing asymmetry, ultrasound reveals 0.5 cm area of concern at 12 o'clock position 1 cm from the nipple, axilla negative, biopsy revealed intermediate grade DCIS ER 100%, PR 100%, Tis N0 stage 0 clinical stage    02/27/2018 Surgery    Left lumpectomy: DCIS with calcifications, intermediate grade, 0.6 cm, margins negative, ER 100%, PR 100%, Tis N0 stage 0     05/02/2018 -  Radiation Therapy    Adjuvant radiation therapy     CHIEF COMPLIANT: Follow-up towards end of radiation therapy  INTERVAL HISTORY: Dorothy Herring is a 77 year old with above-mentioned history of breast DCIS who underwent lumpectomy and will finish radiation on 05/25/2018.  She is here today to discuss antiestrogen therapy plan.  She has mild radiation dermatitis but otherwise doing quite well.  REVIEW OF SYSTEMS:   Constitutional: Denies fevers, chills or abnormal weight loss Eyes: Denies blurriness of vision Ears, nose, mouth, throat, and face: Denies mucositis or sore throat Respiratory: Denies cough, dyspnea or wheezes Cardiovascular: Denies palpitation, chest discomfort Gastrointestinal:  Denies nausea, heartburn or change in bowel habits Skin: Denies abnormal skin rashes Lymphatics: Denies new lymphadenopathy or easy bruising Neurological:Denies numbness, tingling or new weaknesses Behavioral/Psych: Mood is stable, no new changes  Extremities: No lower extremity edema All other systems were reviewed  with the patient and are negative.  I have reviewed the past medical history, past surgical history, social history and family history with the patient and they are unchanged from previous note.  ALLERGIES:  has No Known Allergies.  MEDICATIONS:  Current Outpatient Medications  Medication Sig Dispense Refill  . apixaban (ELIQUIS) 5 MG TABS tablet Take 1 tablet (5 mg total) by mouth 2 (two) times daily. 60 tablet 3  . empagliflozin (JARDIANCE) 10 MG TABS tablet Take 10 mg by mouth daily.    Marland Kitchen gabapentin (NEURONTIN) 300 MG capsule Take 300 mg by mouth 2 (two) times daily.    . insulin glargine (LANTUS) 100 UNIT/ML injection Inject 24 Units into the skin at bedtime.    Marland Kitchen LUMIGAN 0.01 % SOLN     . rosuvastatin (CRESTOR) 40 MG tablet Take 40 mg by mouth daily.  0   No current facility-administered medications for this visit.     PHYSICAL EXAMINATION: ECOG PERFORMANCE STATUS: 1 - Symptomatic but completely ambulatory  Vitals:   05/23/18 1140  BP: 129/84  Pulse: 74  Resp: 18  SpO2: 100%   Filed Weights   05/23/18 1140  Weight: 147 lb 12.8 oz (67 kg)    GENERAL:alert, no distress and comfortable SKIN: skin color, texture, turgor are normal, no rashes or significant lesions EYES: normal, Conjunctiva are pink and non-injected, sclera clear OROPHARYNX:no exudate, no erythema and lips, buccal mucosa, and tongue normal  NECK: supple, thyroid normal size, non-tender, without nodularity LYMPH:  no palpable lymphadenopathy in the cervical, axillary or inguinal LUNGS: clear to auscultation and percussion with normal breathing effort HEART: regular rate & rhythm and no murmurs and no lower extremity edema ABDOMEN:abdomen soft,  non-tender and normal bowel sounds MUSCULOSKELETAL:no cyanosis of digits and no clubbing  NEURO: alert & oriented x 3 with fluent speech, no focal motor/sensory deficits EXTREMITIES: No lower extremity edema   LABORATORY DATA:  I have reviewed the data as  listed CMP Latest Ref Rng & Units 03/31/2018 02/08/2018  Glucose 70 - 99 mg/dL 374(H) 193(H)  BUN 8 - 23 mg/dL 19 21  Creatinine 0.44 - 1.00 mg/dL 0.99 0.90  Sodium 135 - 145 mmol/L 138 140  Potassium 3.5 - 5.1 mmol/L 4.4 4.0  Chloride 98 - 111 mmol/L 104 105  CO2 22 - 32 mmol/L 25 26  Calcium 8.9 - 10.3 mg/dL 9.6 9.5  Total Protein 6.4 - 8.3 g/dL - 6.8  Total Bilirubin 0.2 - 1.2 mg/dL - 0.5  Alkaline Phos 40 - 150 U/L - 47  AST 5 - 34 U/L - 22  ALT 0 - 55 U/L - 24    Lab Results  Component Value Date   WBC 7.3 03/31/2018   HGB 13.3 03/31/2018   HCT 41.0 03/31/2018   MCV 86.9 03/31/2018   PLT 171 03/31/2018   NEUTROABS 4.3 02/08/2018    ASSESSMENT & PLAN:  Ductal carcinoma in situ (DCIS) of left breast 02/27/2018:Left lumpectomy: DCIS with calcifications, intermediate grade, 0.6 cm, margins negative, ER 100%, PR 100%, Tis N0 stage 0  I discussed the final pathology with the patient and provided with a copy of this report plan:  Treatment plan : 1.  Adjuvant radiation therapy started 05/02/2018 2. followed by adjuvant antiestrogen therapy with Letrozole 1 mg daily daily x5 years, she will start this June 20, 2018.  Letrozole counseling: We discussed the risks and benefits of anti-estrogen therapy with aromatase inhibitors. These include but not limited to insomnia, hot flashes, mood changes, vaginal dryness, bone density loss, and weight gain. We strongly believe that the benefits far outweigh the risks. Patient understands these risks and consented to starting treatment. Planned treatment duration is 5 years.  I sent a prescription for letrozole therapy. Return in 3 months for survivorship care plan visit   No orders of the defined types were placed in this encounter.  The patient has a good understanding of the overall plan. she agrees with it. she will call with any problems that may develop before the next visit here.   Harriette Ohara, MD 05/23/18

## 2018-05-23 NOTE — Telephone Encounter (Signed)
Gave pt avs and calendar  °

## 2018-05-24 ENCOUNTER — Ambulatory Visit
Admission: RE | Admit: 2018-05-24 | Discharge: 2018-05-24 | Disposition: A | Discharge status: Home / Self Care | Payer: Medicare HMO | Source: Ambulatory Visit | Attending: Radiation Oncology | Admitting: Radiation Oncology

## 2018-05-24 ENCOUNTER — Encounter: Payer: Self-pay | Admitting: Radiation Oncology

## 2018-05-24 DIAGNOSIS — Z510 Encounter for antineoplastic radiation therapy: Secondary | ICD-10-CM | POA: Diagnosis not present

## 2018-05-24 DIAGNOSIS — D0512 Intraductal carcinoma in situ of left breast: Secondary | ICD-10-CM | POA: Diagnosis not present

## 2018-05-25 ENCOUNTER — Encounter (HOSPITAL_COMMUNITY): Payer: Self-pay | Admitting: Nurse Practitioner

## 2018-05-25 ENCOUNTER — Ambulatory Visit (HOSPITAL_COMMUNITY)
Admission: RE | Admit: 2018-05-25 | Discharge: 2018-05-25 | Disposition: A | Discharge status: Home / Self Care | Payer: Medicare HMO | Source: Ambulatory Visit | Attending: Nurse Practitioner | Admitting: Nurse Practitioner

## 2018-05-25 VITALS — BP 122/74 | HR 96 | Ht 64.0 in | Wt 147.0 lb

## 2018-05-25 DIAGNOSIS — Z79899 Other long term (current) drug therapy: Secondary | ICD-10-CM | POA: Diagnosis not present

## 2018-05-25 DIAGNOSIS — I4819 Other persistent atrial fibrillation: Secondary | ICD-10-CM

## 2018-05-25 DIAGNOSIS — Z7901 Long term (current) use of anticoagulants: Secondary | ICD-10-CM | POA: Diagnosis not present

## 2018-05-25 DIAGNOSIS — E119 Type 2 diabetes mellitus without complications: Secondary | ICD-10-CM | POA: Insufficient documentation

## 2018-05-25 DIAGNOSIS — Z8673 Personal history of transient ischemic attack (TIA), and cerebral infarction without residual deficits: Secondary | ICD-10-CM | POA: Insufficient documentation

## 2018-05-25 DIAGNOSIS — I481 Persistent atrial fibrillation: Secondary | ICD-10-CM | POA: Insufficient documentation

## 2018-05-25 DIAGNOSIS — Z923 Personal history of irradiation: Secondary | ICD-10-CM | POA: Insufficient documentation

## 2018-05-25 DIAGNOSIS — C50919 Malignant neoplasm of unspecified site of unspecified female breast: Secondary | ICD-10-CM | POA: Insufficient documentation

## 2018-05-25 DIAGNOSIS — Z794 Long term (current) use of insulin: Secondary | ICD-10-CM | POA: Insufficient documentation

## 2018-05-25 DIAGNOSIS — E785 Hyperlipidemia, unspecified: Secondary | ICD-10-CM | POA: Insufficient documentation

## 2018-05-25 LAB — BASIC METABOLIC PANEL
Anion gap: 8 (ref 5–15)
BUN: 18 mg/dL (ref 8–23)
CALCIUM: 10 mg/dL (ref 8.9–10.3)
CO2: 29 mmol/L (ref 22–32)
Chloride: 102 mmol/L (ref 98–111)
Creatinine, Ser: 0.99 mg/dL (ref 0.44–1.00)
GFR calc Af Amer: >60 mL/min (ref >60)
GFR, EST NON AFRICAN AMERICAN: 55 mL/min — AB (ref >60)
GLUCOSE: 193 mg/dL — AB (ref 70–99)
Potassium: 4.2 mmol/L (ref 3.5–5.1)
Sodium: 139 mmol/L (ref 135–145)

## 2018-05-25 LAB — CBC
HCT: 43.7 % (ref 36.0–46.0)
Hemoglobin: 14 g/dL (ref 12.0–15.0)
MCH: 28.2 pg (ref 26.0–34.0)
MCHC: 32 g/dL (ref 30.0–36.0)
MCV: 87.9 fL (ref 78.0–100.0)
PLATELETS: 172 10*3/uL (ref 150–400)
RBC: 4.97 MIL/uL (ref 3.87–5.11)
RDW: 12.8 % (ref 11.5–15.5)
WBC: 5.4 10*3/uL (ref 4.0–10.5)

## 2018-05-25 NOTE — Patient Instructions (Addendum)
Cardioversion scheduled for Wednesday, September 11th  - Arrive at the [facility] and go to admitting at 12PM  -Do not eat or drink anything after midnight the night prior to your procedure.  - Take all your morning medication except for diabetes medication with a sip of water prior to arrival. Take 1/2 dose of lantus night before  - You will not be able to drive home after your procedure.

## 2018-05-25 NOTE — H&P (View-Only) (Signed)
Electrophysiology Office Note   Date:  05/25/2018   ID:  Dorothy Herring, Dorothy Herring [birth year], MRN [id number]  PCP:  Shon Baton, MD  Cardiologist:  none Primary Electrophysiologist:Dr. Rayann Heman     CC: afib   History of Present Illness: Dorothy Herring is a 77 y.o. female who presents today for "bruising on arms while at beach."  She has been recently diagnosed with atrial fibrillation. She was unaware.  She had her breast surgery for lump several weeks ago and is healing well. She was due to start radiation in a few weeks. She saw Dr. Rayann Heman  6/17 and eliquis was started at that point. He mentioned trying cardioversion after 3 weeks anticoagulation.  She states while she was at the beach she noted bruising of arms, so she reduced eliquis to one tab a day. Today, bruising has resolved. She did have around 3 alcoholic drinks while at the beach. She is still asymptomatic with afib.  F/u in fib clinic,7/29 she is back to bid eliquis without any bruising of arms noted. We did discuss cardioversion but at this point, she would rather wait until after her radiation is finished which will take about one month to pursue.  F/u in afib clinic, 9/5, pt is now finished with her radiation and is ready to purse cardioversion. No missed doses of anticoagulation. She still states that her energy is good and she is asymptomatic.   Today, she denies symptoms of palpitations, chest pain, shortness of breath, orthopnea, PND, lower extremity edema, claudication, dizziness, presyncope, syncope, bleeding, or neurologic sequela. The patient is tolerating medications without difficulties and is otherwise without complaint today.    Past Medical History:  Diagnosis Date  . Cancer ([place])   . Diabetes mellitus   . Hyperlipidemia   . Persistent atrial fibrillation ([place])   . TIA (transient ischemic attack)    seven years ago   Past Surgical History:  Procedure Laterality Date  . ABDOMINAL HYSTERECTOMY    . BREAST  BIOPSY    . BREAST LUMPECTOMY WITH RADIOACTIVE SEED LOCALIZATION Left 02/27/2018   Procedure: BREAST LUMPECTOMY WITH RADIOACTIVE SEED LOCALIZATION;  Surgeon: Alphonsa Overall, MD;  Location: [facility];  Service: General;  Laterality: Left;     Current Outpatient Medications  Medication Sig Dispense Refill  . apixaban (ELIQUIS) 5 MG TABS tablet Take 1 tablet (5 mg total) by mouth 2 (two) times daily. 60 tablet 3  . empagliflozin (JARDIANCE) 10 MG TABS tablet Take 10 mg by mouth daily.    Marland Kitchen gabapentin (NEURONTIN) 300 MG capsule Take 300 mg by mouth 2 (two) times daily.    . insulin glargine (LANTUS) 100 UNIT/ML injection Inject 24 Units into the skin at bedtime.    Marland Kitchen LUMIGAN 0.01 % SOLN     . rosuvastatin (CRESTOR) 40 MG tablet Take 40 mg by mouth daily.  0  . Specialty Vitamins Products (MENOPAUSE SUPPORT PO) Take by mouth.    . letrozole (FEMARA) 2.5 MG tablet Take 1 tablet (2.5 mg total) by mouth daily. (Patient not taking: Reported on 05/25/2018) 90 tablet 3   No current facility-administered medications for this encounter.     Allergies:   Patient has no known allergies.   Social History:  The patient  reports that she has never smoked. She has never used smokeless tobacco. She reports that she does not drink alcohol or use drugs.   Family History:  The patient's  family history includes Alzheimer's disease in her mother;  Lung cancer in her father.    ROS:  Please see the history of present illness.   All other systems are personally reviewed and negative.    PHYSICAL EXAM: VS:  BP 122/74 (BP Location: Left Arm, Patient Position: Sitting, Cuff Size: Normal)   Pulse 96   Ht 5\' 4"  (1.626 m)   Wt 66.7 kg   BMI 25.23 kg/m  , BMI Body mass index is 25.23 kg/m. GEN: Well nourished, well developed, in no acute distress  HEENT: normal  Neck: no JVD, carotid bruits, or masses Cardiac: iRRR; no murmurs, rubs, or gallops,no edema  Respiratory:  clear to auscultation  bilaterally, normal work of breathing GI: soft, nontender, nondistended, + BS MS: no deformity or atrophy  Skin: warm and dry  Neuro:  Strength and sensation are intact Psych: euthymic mood, full affect  EKG:   Afib at 96 bpm, qrs int 86 ms, qtc 475 ms   Recent Labs: 02/08/2018: ALT 24 05/25/2018: BUN 18; Creatinine, Ser 0.99; Hemoglobin 14.0; Platelets 172; Potassium 4.2; Sodium 139  personally reviewed   Lipid Panel  No results found for: CHOL, TRIG, HDL, CHOLHDL, VLDL, LDLCALC, LDLDIRECT personally reviewed   Wt Readings from Last 3 Encounters:  05/25/18 66.7 kg  05/23/18 67 kg  04/17/18 67.1 kg      Other studies personally reviewed: Additional studies/ records that were reviewed today include: AF clinic notes, recent echo  Review of the above records today demonstrates: as above   ASSESSMENT AND PLAN:  1.  Persistent afib Asymptomatic Rate controlled chads2vasc score is at least 6.  Continue eliquis 5 mg bid, stats no interruption in anticoagulation in the last 3 weeks Risk vrs benefit of cardioversion discussed, scheduled for 9/11.   2. Breast CA  Finished radiation  and tolerated  well Per oncology  Follow-up in afib clinic one week after cardioversion    Labs/ tests ordered today include:  Orders Placed This Encounter  Procedures  . CBC  . Basic metabolic panel  . EKG 12-Lead     Geroge Baseman. Oleva Koo, [facility] [address] [place], [place] [address] [phone]

## 2018-05-25 NOTE — Progress Notes (Signed)
Electrophysiology Office Note   Date:  05/25/2018   ID:  Dorothy, Herring [birth year], MRN [id number]  PCP:  Shon Baton, MD  Cardiologist:  none Primary Electrophysiologist:Dr. Rayann Heman     CC: afib   History of Present Illness: Dorothy Herring is a 77 y.o. female who presents today for "bruising on arms while at beach."  She has been recently diagnosed with atrial fibrillation. She was unaware.  She had her breast surgery for lump several weeks ago and is healing well. She was due to start radiation in a few weeks. She saw Dr. Rayann Heman  6/17 and eliquis was started at that point. He mentioned trying cardioversion after 3 weeks anticoagulation.  She states while she was at the beach she noted bruising of arms, so she reduced eliquis to one tab a day. Today, bruising has resolved. She did have around 3 alcoholic drinks while at the beach. She is still asymptomatic with afib.  F/u in fib clinic,7/29 she is back to bid eliquis without any bruising of arms noted. We did discuss cardioversion but at this point, she would rather wait until after her radiation is finished which will take about one month to pursue.  F/u in afib clinic, 9/5, pt is now finished with her radiation and is ready to purse cardioversion. No missed doses of anticoagulation. She still states that her energy is good and she is asymptomatic.   Today, she denies symptoms of palpitations, chest pain, shortness of breath, orthopnea, PND, lower extremity edema, claudication, dizziness, presyncope, syncope, bleeding, or neurologic sequela. The patient is tolerating medications without difficulties and is otherwise without complaint today.    Past Medical History:  Diagnosis Date  . Cancer ([place])   . Diabetes mellitus   . Hyperlipidemia   . Persistent atrial fibrillation ([place])   . TIA (transient ischemic attack)    seven years ago   Past Surgical History:  Procedure Laterality Date  . ABDOMINAL HYSTERECTOMY    . BREAST  BIOPSY    . BREAST LUMPECTOMY WITH RADIOACTIVE SEED LOCALIZATION Left 02/27/2018   Procedure: BREAST LUMPECTOMY WITH RADIOACTIVE SEED LOCALIZATION;  Surgeon: Alphonsa Overall, MD;  Location: [facility];  Service: General;  Laterality: Left;     Current Outpatient Medications  Medication Sig Dispense Refill  . apixaban (ELIQUIS) 5 MG TABS tablet Take 1 tablet (5 mg total) by mouth 2 (two) times daily. 60 tablet 3  . empagliflozin (JARDIANCE) 10 MG TABS tablet Take 10 mg by mouth daily.    Marland Kitchen gabapentin (NEURONTIN) 300 MG capsule Take 300 mg by mouth 2 (two) times daily.    . insulin glargine (LANTUS) 100 UNIT/ML injection Inject 24 Units into the skin at bedtime.    Marland Kitchen LUMIGAN 0.01 % SOLN     . rosuvastatin (CRESTOR) 40 MG tablet Take 40 mg by mouth daily.  0  . Specialty Vitamins Products (MENOPAUSE SUPPORT PO) Take by mouth.    . letrozole (FEMARA) 2.5 MG tablet Take 1 tablet (2.5 mg total) by mouth daily. (Patient not taking: Reported on 05/25/2018) 90 tablet 3   No current facility-administered medications for this encounter.     Allergies:   Patient has no known allergies.   Social History:  The patient  reports that she has never smoked. She has never used smokeless tobacco. She reports that she does not drink alcohol or use drugs.   Family History:  The patient's  family history includes Alzheimer's disease in her mother;  Lung cancer in her father.    ROS:  Please see the history of present illness.   All other systems are personally reviewed and negative.    PHYSICAL EXAM: VS:  BP 122/74 (BP Location: Left Arm, Patient Position: Sitting, Cuff Size: Normal)   Pulse 96   Ht 5\' 4"  (1.626 m)   Wt 66.7 kg   BMI 25.23 kg/m  , BMI Body mass index is 25.23 kg/m. GEN: Well nourished, well developed, in no acute distress  HEENT: normal  Neck: no JVD, carotid bruits, or masses Cardiac: iRRR; no murmurs, rubs, or gallops,no edema  Respiratory:  clear to auscultation  bilaterally, normal work of breathing GI: soft, nontender, nondistended, + BS MS: no deformity or atrophy  Skin: warm and dry  Neuro:  Strength and sensation are intact Psych: euthymic mood, full affect  EKG:   Afib at 96 bpm, qrs int 86 ms, qtc 475 ms   Recent Labs: 02/08/2018: ALT 24 05/25/2018: BUN 18; Creatinine, Ser 0.99; Hemoglobin 14.0; Platelets 172; Potassium 4.2; Sodium 139  personally reviewed   Lipid Panel  No results found for: CHOL, TRIG, HDL, CHOLHDL, VLDL, LDLCALC, LDLDIRECT personally reviewed   Wt Readings from Last 3 Encounters:  05/25/18 66.7 kg  05/23/18 67 kg  04/17/18 67.1 kg      Other studies personally reviewed: Additional studies/ records that were reviewed today include: AF clinic notes, recent echo  Review of the above records today demonstrates: as above   ASSESSMENT AND PLAN:  1.  Persistent afib Asymptomatic Rate controlled chads2vasc score is at least 6.  Continue eliquis 5 mg bid, stats no interruption in anticoagulation in the last 3 weeks Risk vrs benefit of cardioversion discussed, scheduled for 9/11.   2. Breast CA  Finished radiation  and tolerated  well Per oncology  Follow-up in afib clinic one week after cardioversion    Labs/ tests ordered today include:  Orders Placed This Encounter  Procedures  . CBC  . Basic metabolic panel  . EKG 12-Lead     Geroge Baseman. [place], [facility] [address] [place], [place] [address] [phone]

## 2018-05-29 ENCOUNTER — Encounter: Payer: Self-pay | Admitting: Gynecology

## 2018-05-29 ENCOUNTER — Ambulatory Visit: Payer: Medicare HMO | Admitting: Gynecology

## 2018-05-29 VITALS — BP 128/72 | Ht 62.0 in | Wt 148.0 lb

## 2018-05-29 DIAGNOSIS — N9089 Other specified noninflammatory disorders of vulva and perineum: Secondary | ICD-10-CM | POA: Diagnosis not present

## 2018-05-29 DIAGNOSIS — Z1272 Encounter for screening for malignant neoplasm of vagina: Secondary | ICD-10-CM | POA: Diagnosis not present

## 2018-05-29 DIAGNOSIS — R8290 Unspecified abnormal findings in urine: Secondary | ICD-10-CM | POA: Diagnosis not present

## 2018-05-29 LAB — WET PREP FOR TRICH, YEAST, CLUE

## 2018-05-29 MED ORDER — NYSTATIN-TRIAMCINOLONE 100000-0.1 UNIT/GM-% EX OINT: 1.0000 "application " | TOPICAL_OINTMENT | Freq: Two times a day (BID) | CUTANEOUS | 0 refills | Status: DC

## 2018-05-29 MED ORDER — TERCONAZOLE 0.4 % VA CREA: 1.0000 | TOPICAL_CREAM | Freq: Every day | VAGINAL | 0 refills | Status: DC

## 2018-05-29 NOTE — Patient Instructions (Signed)
Use the Terazol 7-day cream both internally and externally.  Use the Mycolog ointment at bedtime on the outside of the vagina as needed for itching.  Follow-up if your symptoms would persist or worsen.  Schedule a follow-up colonoscopy as it appears you are over 10 years from your last study.

## 2018-05-29 NOTE — Addendum Note (Signed)
Addended by: Thurnell Garbe A on: 05/29/2018 12:50 PM   Modules accepted: Orders

## 2018-05-29 NOTE — Addendum Note (Signed)
Addended by: Thurnell Garbe A on: 05/29/2018 04:27 PM   Modules accepted: Orders

## 2018-05-29 NOTE — Progress Notes (Signed)
    Dorothy Herring [birth year] [id number]        76 y.o.  G1P1 new patient presents complaining of on and off vulvar itching over the past several months.  Had been treated with Diflucan for yeast which transiently resolved the issue but it returns.  No significant discharge.  No vaginal odor.  No urinary symptoms such as frequency dysuria urgency low back pain fever or chills.  History of hysterectomy for dysplasia at age 73.  Subsequently had her ovaries removed years later as a separate procedure.  Mammogram 2019.  Colonoscopy 2007.  DEXA 2017 T score -1.2 FRAX 10% / 1.5%.  Past medical history,surgical history, problem list, medications, allergies, family history and social history were all reviewed and documented in the EPIC chart.  Directed ROS with pertinent positives and negatives documented in the history of present illness/assessment and plan.  Exam: Copywriter, advertising Vitals:   05/29/18 1214  BP: 128/72  Weight: 148 lb (67.1 kg)  Height: 5\' 2"  (1.575 m)   General appearance:  Normal Abdomen soft nontender without masses guarding rebound Pelvic external BUS vagina with atrophic changes.  Scant discharge noted.  Bimanual without masses or tenderness.  Rectal exam is normal.  Assessment/Plan:  77 y.o. G1P1 with intermittent vulvar itching over the past several months.  Diflucan transiently improves but symptoms returned.  Wet prep today is negative.  Exam shows no evidence of a chronic vulvitis pattern such as lichen sclerosus or hyperplastic irritative changes.  Will treat as a resistant yeast with Terazol 7 day cream both internal and external.  Also I gave a prescription for Mycolog ointment to apply as needed externally if she would have recurrent symptoms.  Assuming that her symptoms totally resolve then will follow expectantly.  If they would continue despite this treatment then she will represent for further evaluation.  I reminded her it appears that she is overdue for her  colonoscopy and asked her to call to make arrangements for this.  We also discussed her osteopenia and she will follow-up with her primary's recommendation as far as when to repeat her bone density.  I did do a Pap smear today noting her indication for hysterectomy was significant dysplasia and I have no copies of follow-up Pap smears.    Anastasio Auerbach MD, 12:31 PM 05/29/2018

## 2018-05-30 LAB — PAP IG W/ RFLX HPV ASCU

## 2018-05-30 NOTE — Progress Notes (Signed)
  Radiation Oncology         [phone] ________________________________  Name: MEGANN EASTERWOOD MRN: [id number]  Date: 05/24/2018  DOB: 1940-11-13  End of Treatment Note  Diagnosis:   77 y.o. female with Stage0, cTisN0 LeftBreast CentralDuctal Carcinoma In Situ, ER(+)/ PR(+),Intermediate Grade     Indication for treatment:  Curative       Radiation treatment dates:   05/02/2018 - 05/24/2018  Site/dose:   Left Breast / 42.56 Gy in 16 fractions  Beams/energy:   3D / 6X, 10X photons  Narrative: The patient tolerated radiation treatment relatively well.   She experienced some expected skin irritation as she progressed through treatment, notably subtle erythema over the left breast with mild dermatitis in the UIQ. She is using her Radiaplex cream as ordered. She also reported having mild fatigue.  Plan: The patient has completed radiation treatment. The patient will return to radiation oncology clinic for routine followup in one month. I advised them to call or return sooner if they have any questions or concerns related to their recovery or treatment.  -----------------------------------  Eppie Gibson, MD  This document serves as a record of services personally performed by Eppie Gibson, MD. It was created on her behalf by Rae Lips, a trained medical scribe. The creation of this record is based on the scribe's personal observations and the provider's statements to them. This document has been checked and approved by the attending provider.

## 2018-05-31 ENCOUNTER — Ambulatory Visit (HOSPITAL_COMMUNITY)
Admission: RE | Admit: 2018-05-31 | Discharge: 2018-05-31 | Disposition: A | Discharge status: Home / Self Care | Payer: Medicare HMO | Source: Ambulatory Visit | Attending: Cardiovascular Disease | Admitting: Cardiovascular Disease

## 2018-05-31 ENCOUNTER — Ambulatory Visit (HOSPITAL_COMMUNITY): Payer: Medicare HMO | Admitting: Anesthesiology

## 2018-05-31 ENCOUNTER — Encounter (HOSPITAL_COMMUNITY)
Admission: RE | Disposition: A | Discharge status: Home / Self Care | Payer: Self-pay | Source: Ambulatory Visit | Attending: Cardiovascular Disease

## 2018-05-31 ENCOUNTER — Encounter (HOSPITAL_COMMUNITY): Payer: Self-pay | Admitting: Cardiovascular Disease

## 2018-05-31 ENCOUNTER — Other Ambulatory Visit: Payer: Self-pay

## 2018-05-31 DIAGNOSIS — I481 Persistent atrial fibrillation: Secondary | ICD-10-CM | POA: Diagnosis not present

## 2018-05-31 DIAGNOSIS — Z801 Family history of malignant neoplasm of trachea, bronchus and lung: Secondary | ICD-10-CM | POA: Insufficient documentation

## 2018-05-31 DIAGNOSIS — Z8673 Personal history of transient ischemic attack (TIA), and cerebral infarction without residual deficits: Secondary | ICD-10-CM | POA: Insufficient documentation

## 2018-05-31 DIAGNOSIS — Z7901 Long term (current) use of anticoagulants: Secondary | ICD-10-CM | POA: Insufficient documentation

## 2018-05-31 DIAGNOSIS — C50919 Malignant neoplasm of unspecified site of unspecified female breast: Secondary | ICD-10-CM | POA: Insufficient documentation

## 2018-05-31 DIAGNOSIS — Z79899 Other long term (current) drug therapy: Secondary | ICD-10-CM | POA: Insufficient documentation

## 2018-05-31 DIAGNOSIS — E785 Hyperlipidemia, unspecified: Secondary | ICD-10-CM | POA: Insufficient documentation

## 2018-05-31 DIAGNOSIS — E119 Type 2 diabetes mellitus without complications: Secondary | ICD-10-CM | POA: Insufficient documentation

## 2018-05-31 DIAGNOSIS — I4819 Other persistent atrial fibrillation: Secondary | ICD-10-CM

## 2018-05-31 DIAGNOSIS — Z794 Long term (current) use of insulin: Secondary | ICD-10-CM | POA: Diagnosis not present

## 2018-05-31 DIAGNOSIS — Z9889 Other specified postprocedural states: Secondary | ICD-10-CM | POA: Diagnosis not present

## 2018-05-31 DIAGNOSIS — I4891 Unspecified atrial fibrillation: Secondary | ICD-10-CM | POA: Diagnosis not present

## 2018-05-31 DIAGNOSIS — Z90710 Acquired absence of both cervix and uterus: Secondary | ICD-10-CM | POA: Diagnosis not present

## 2018-05-31 HISTORY — DX: Other specified postprocedural states: Z98.890

## 2018-05-31 HISTORY — PX: CARDIOVERSION: SHX1299

## 2018-05-31 HISTORY — DX: Nausea with vomiting, unspecified: R11.2

## 2018-05-31 LAB — URINALYSIS, COMPLETE W/RFL CULTURE
BACTERIA UA: NONE SEEN /HPF
Bilirubin Urine: NEGATIVE
HGB URINE DIPSTICK: NEGATIVE
HYALINE CAST: NONE SEEN /LPF
Ketones, ur: NEGATIVE
Leukocyte Esterase: NEGATIVE
Nitrites, Initial: NEGATIVE
RBC / HPF: NONE SEEN /HPF (ref 0–2)
Specific Gravity, Urine: 1.036 — ABNORMAL HIGH (ref 1.001–1.03)
pH: <=5 (ref 5.0–8.0)

## 2018-05-31 LAB — URINE CULTURE
MICRO NUMBER: 91081046
Result:: NO GROWTH
SPECIMEN QUALITY:: ADEQUATE

## 2018-05-31 LAB — GLUCOSE, CAPILLARY: Glucose-Capillary: 114 mg/dL — ABNORMAL HIGH (ref 70–99)

## 2018-05-31 LAB — CULTURE INDICATED

## 2018-05-31 SURGERY — CARDIOVERSION
Anesthesia: General

## 2018-05-31 MED ORDER — PROPOFOL 10 MG/ML IV BOLUS
INTRAVENOUS | Status: DC | PRN
  Administered 2018-05-31: 60 mg via INTRAVENOUS

## 2018-05-31 MED ORDER — LIDOCAINE HCL (CARDIAC) PF 100 MG/5ML IV SOSY
PREFILLED_SYRINGE | INTRAVENOUS | Status: DC | PRN
  Administered 2018-05-31: 50 mg via INTRAVENOUS

## 2018-05-31 MED ORDER — SODIUM CHLORIDE 0.9 % IV SOLN
INTRAVENOUS | Status: DC
  Administered 2018-05-31 (×2): via INTRAVENOUS

## 2018-05-31 NOTE — Anesthesia Postprocedure Evaluation (Signed)
Anesthesia Post Note  Patient: Dorothy Herring  Procedure(s) Performed: CARDIOVERSION (N/A )     Patient location during evaluation: Endoscopy Anesthesia Type: General Level of consciousness: awake Pain management: pain level controlled Vital Signs Assessment: post-procedure vital signs reviewed and stable Respiratory status: spontaneous breathing Cardiovascular status: stable Postop Assessment: no apparent nausea or vomiting Anesthetic complications: no    Last Vitals:  Vitals:   05/31/18 1320 05/31/18 1322  BP:  (!) 111/57  Pulse:  64  Resp: 17 11  Temp:  36.4 C  SpO2:  100%    Last Pain:  Vitals:   05/31/18 1322  TempSrc: Oral  PainSc: 0-No pain   Pain Goal:                 Yamel Bale JR,JOHN Diego Delancey

## 2018-05-31 NOTE — Anesthesia Procedure Notes (Signed)
Procedure Name: General with mask airway Date/Time: 05/31/2018 1:20 PM Performed by: Eligha Bridegroom, CRNA Pre-anesthesia Checklist: Patient identified, Emergency Drugs available, Suction available, Patient being monitored and Timeout performed Patient Re-evaluated:Patient Re-evaluated prior to induction Oxygen Delivery Method: Circle system utilized and Ambu bag Preoxygenation: Pre-oxygenation with 100% oxygen Induction Type: IV induction

## 2018-05-31 NOTE — Discharge Instructions (Signed)
Electrical Cardioversion, Care After °This sheet gives you information about how to care for yourself after your procedure. Your health care provider may also give you more specific instructions. If you have problems or questions, contact your health care provider. °What can I expect after the procedure? °After the procedure, it is common to have: °· Some redness on the skin where the shocks were given. ° °Follow these instructions at home: °· Do not drive for 24 hours if you were given a medicine to help you relax (sedative). °· Take over-the-counter and prescription medicines only as told by your health care provider. °· Ask your health care provider how to check your pulse. Check it often. °· Rest for 48 hours after the procedure or as told by your health care provider. °· Avoid or limit your caffeine use as told by your health care provider. °Contact a health care provider if: °· You feel like your heart is beating too quickly or your pulse is not regular. °· You have a serious muscle cramp that does not go away. °Get help right away if: °· You have discomfort in your chest. °· You are dizzy or you feel faint. °· You have trouble breathing or you are short of breath. °· Your speech is slurred. °· You have trouble moving an arm or leg on one side of your body. °· Your fingers or toes turn cold or blue. °This information is not intended to replace advice given to you by your health care provider. Make sure you discuss any questions you have with your health care provider. °Document Released: 06/27/2013 Document Revised: 04/09/2016 Document Reviewed: 03/12/2016 °Elsevier Interactive Patient Education © 2018 [employer] ° °

## 2018-05-31 NOTE — Interval H&P Note (Signed)
History and Physical Interval Note:  05/31/2018 1:09 PM  Dorothy Herring  has presented today for surgery, with the diagnosis of A-FIB  The various methods of treatment have been discussed with the patient and family. After consideration of risks, benefits and other options for treatment, the patient has consented to  Procedure(s): CARDIOVERSION (N/A) as a surgical intervention .  The patient's history has been reviewed, patient examined, no change in status, stable for surgery.  I have reviewed the patient's chart and labs.  Questions were answered to the patient's satisfaction.     [name], MD

## 2018-05-31 NOTE — Anesthesia Preprocedure Evaluation (Addendum)
Anesthesia Evaluation  Patient identified by MRN, date of birth, ID band Patient awake    Reviewed: Allergy & Precautions, NPO status , Patient's Chart, lab work & pertinent test results  History of Anesthesia Complications (+) PONV  Airway Mallampati: I       Dental no notable dental hx. (+) Teeth Intact   Pulmonary neg pulmonary ROS,    Pulmonary exam normal breath sounds clear to auscultation       Cardiovascular Normal cardiovascular exam+ dysrhythmias Atrial Fibrillation  Rhythm:Irregular Rate:Normal     Neuro/Psych    GI/Hepatic   Endo/Other  diabetes, Insulin Dependent, Oral Hypoglycemic Agents  Renal/GU   negative genitourinary   Musculoskeletal negative musculoskeletal ROS (+)   Abdominal Normal abdominal exam  (+)   Peds  Hematology negative hematology ROS (+)   Anesthesia Other Findings Dorothy Herring  ECHO COMPLETE WO IMAGING ENHANCING AGENT  Order# [id number]  Reading physician: Jerline Pain, MD Ordering physician: Sherran Needs, NP Study date: 02/10/18 Result Notes for ECHOCARDIOGRAM COMPLETE   Notes recorded by Sherran Needs, NP on 02/10/2018 at 12:14 PM EDT EF is normal, mod TR, overall structure of heart looks OK to proceed to surgery.   Study Result   Result status: Final result                             *[facility] [employer].                        [place], [place] [address]                            [phone]  ------------------------------------------------------------------- Transthoracic Echocardiography  Patient:    Dorothy Herring, Dorothy Herring MR #:       [id number] Study Date: 02/10/2018 Gender:     F Age:        77 Height:     165.1 cm Weight:     69.3 kg BSA:        1.8 m^2 Pt. Status: Room:   ATTENDING    Marvell Fuller  REFERRING    Sherran Needs  PERFORMING   [facility], Outpatient  SONOGRAPHER  Chelsea Androw  cc:  ------------------------------------------------------------------- LV EF: 60% -   65%  -------------------------------------------------------------------    Reproductive/Obstetrics                           Anesthesia Physical Anesthesia Plan  ASA: II  Anesthesia Plan: General   Post-op Pain Management:    Induction:   PONV Risk Score and Plan:   Airway Management Planned: Natural Airway and Mask  Additional Equipment:   Intra-op Plan:   Post-operative Plan:   Informed Consent: I have reviewed the patients History and Physical, chart, labs and discussed the procedure including the risks, benefits and alternatives for the proposed anesthesia with the patient or authorized representative who has indicated his/her understanding and acceptance.     Plan Discussed with:   Anesthesia Plan Comments:  Anesthesia Quick Evaluation  

## 2018-05-31 NOTE — CV Procedure (Signed)
Electrical Cardioversion Procedure Note Dorothy Herring [id number] 02/05/1941  Procedure: Electrical Cardioversion Indications:  Atrial Fibrillation  Procedure Details Consent: Risks of procedure as well as the alternatives and risks of each were explained to the (patient/caregiver).  Consent for procedure obtained. Time Out: Verified patient identification, verified procedure, site/side was marked, verified correct patient position, special equipment/implants available, medications/allergies/relevent history reviewed, required imaging and test results available.  Performed  Patient placed on cardiac monitor, pulse oximetry, supplemental oxygen as necessary.  Sedation given: propofol Pacer pads placed R anterior chest and L axilla.  Cardioverted 1 time(s).  Cardioverted at 150J.  Evaluation Findings: Post procedure EKG shows: NSR Complications: None Patient did tolerate procedure well.   Dorothy Latch, MD 05/31/2018, 1:14 PM

## 2018-05-31 NOTE — Transfer of Care (Signed)
Immediate Anesthesia Transfer of Care Note  Patient: Dorothy Herring  Procedure(s) Performed: CARDIOVERSION (N/A )  Patient Location: PACU  Anesthesia Type:General  Level of Consciousness: awake, alert  and oriented  Airway & Oxygen Therapy: Patient Spontanous Breathing and Patient connected to nasal cannula oxygen  Post-op Assessment: Report given to RN and Post -op Vital signs reviewed and stable  Post vital signs: Reviewed and stable  Last Vitals:  Vitals Value Taken Time  BP 113/84 05/31/2018  1:19 PM  Temp    Pulse 72 05/31/2018  1:18 PM  Resp 17 05/31/2018  1:20 PM  SpO2 100 % 05/31/2018  1:18 PM    Last Pain:  Vitals:   05/31/18 1226  TempSrc: Oral         Complications: No apparent anesthesia complications

## 2018-06-05 ENCOUNTER — Ambulatory Visit (HOSPITAL_COMMUNITY)
Admission: RE | Admit: 2018-06-05 | Discharge: 2018-06-05 | Disposition: A | Discharge status: Home / Self Care | Payer: Medicare HMO | Source: Ambulatory Visit | Attending: Nurse Practitioner | Admitting: Nurse Practitioner

## 2018-06-05 ENCOUNTER — Encounter (HOSPITAL_COMMUNITY): Payer: Self-pay | Admitting: Nurse Practitioner

## 2018-06-05 VITALS — BP 122/66 | HR 67 | Ht 62.0 in | Wt 149.0 lb

## 2018-06-05 DIAGNOSIS — R69 Illness, unspecified: Secondary | ICD-10-CM | POA: Diagnosis not present

## 2018-06-05 DIAGNOSIS — Z9889 Other specified postprocedural states: Secondary | ICD-10-CM | POA: Diagnosis not present

## 2018-06-05 DIAGNOSIS — I491 Atrial premature depolarization: Secondary | ICD-10-CM | POA: Diagnosis not present

## 2018-06-05 DIAGNOSIS — I481 Persistent atrial fibrillation: Secondary | ICD-10-CM | POA: Diagnosis not present

## 2018-06-05 DIAGNOSIS — Z79899 Other long term (current) drug therapy: Secondary | ICD-10-CM | POA: Insufficient documentation

## 2018-06-05 DIAGNOSIS — Z7901 Long term (current) use of anticoagulants: Secondary | ICD-10-CM | POA: Insufficient documentation

## 2018-06-05 DIAGNOSIS — E119 Type 2 diabetes mellitus without complications: Secondary | ICD-10-CM | POA: Insufficient documentation

## 2018-06-05 DIAGNOSIS — Z794 Long term (current) use of insulin: Secondary | ICD-10-CM | POA: Diagnosis not present

## 2018-06-05 DIAGNOSIS — E785 Hyperlipidemia, unspecified: Secondary | ICD-10-CM | POA: Insufficient documentation

## 2018-06-05 DIAGNOSIS — R9431 Abnormal electrocardiogram [ECG] [EKG]: Secondary | ICD-10-CM | POA: Diagnosis not present

## 2018-06-05 DIAGNOSIS — Z8673 Personal history of transient ischemic attack (TIA), and cerebral infarction without residual deficits: Secondary | ICD-10-CM | POA: Insufficient documentation

## 2018-06-05 DIAGNOSIS — C50919 Malignant neoplasm of unspecified site of unspecified female breast: Secondary | ICD-10-CM | POA: Insufficient documentation

## 2018-06-05 DIAGNOSIS — Z79811 Long term (current) use of aromatase inhibitors: Secondary | ICD-10-CM | POA: Insufficient documentation

## 2018-06-05 DIAGNOSIS — I4819 Other persistent atrial fibrillation: Secondary | ICD-10-CM

## 2018-06-05 NOTE — Progress Notes (Signed)
Electrophysiology Office Note   Date:  06/05/2018   ID:  Dorothy, Herring [birth year], MRN [id number]  PCP:  Shon Baton, MD  Cardiologist:  none Primary Electrophysiologist:Dr. Rayann Heman     CC: afib   History of Present Illness: Dorothy Herring is a 77 y.o. female who presents today for "bruising on arms while at beach."  She has been recently diagnosed with atrial fibrillation. She was unaware.  She had her breast surgery for lump several weeks ago and is healing well. She was due to start radiation in a few weeks. She saw Dr. Rayann Heman  6/17 and eliquis was started at that point. He mentioned trying cardioversion after 3 weeks anticoagulation.  She states while she was at the beach she noted bruising of arms, so she reduced eliquis to one tab a day. Today, bruising has resolved. She did have around 3 alcoholic drinks while at the beach. She is still asymptomatic with afib.  F/u in fib clinic,7/29 she is back to bid eliquis without any bruising of arms noted. We did discuss cardioversion but at this point, she would rather wait until after her radiation is finished which will take about one month to pursue.  F/u in afib clinic, 9/5, pt is now finished with her radiation and is ready to purse cardioversion. No missed doses of anticoagulation. She still states that her energy is good and she is asymptomatic.   F/u 9/16, s/p successful cardioversion, and remains in SR. She does not feel any different but is just recently finished radiation and does not know if she still hs some fatigue from that.  Today, she denies symptoms of palpitations, chest pain, shortness of breath, orthopnea, PND, lower extremity edema, claudication, dizziness, presyncope, syncope, bleeding, or neurologic sequela. The patient is tolerating medications without difficulties and is otherwise without complaint today.    Past Medical History:  Diagnosis Date  . Cancer ([place])   . Diabetes mellitus   . Hyperlipidemia   .  Persistent atrial fibrillation ([place])   . PONV (postoperative nausea and vomiting)   . TIA (transient ischemic attack)    seven years ago   Past Surgical History:  Procedure Laterality Date  . ABDOMINAL HYSTERECTOMY  age 44  . BILATERAL SALPINGOOPHORECTOMY     By patient history  . BREAST BIOPSY    . BREAST LUMPECTOMY WITH RADIOACTIVE SEED LOCALIZATION Left 02/27/2018   Procedure: BREAST LUMPECTOMY WITH RADIOACTIVE SEED LOCALIZATION;  Surgeon: Alphonsa Overall, MD;  Location: [facility];  Service: General;  Laterality: Left;  . CARDIOVERSION N/A 05/31/2018   Procedure: CARDIOVERSION;  Surgeon: [name], MD;  Location: [facility] ENDOSCOPY;  Service: Cardiovascular;  Laterality: N/A;     Current Outpatient Medications  Medication Sig Dispense Refill  . apixaban (ELIQUIS) 5 MG TABS tablet Take 1 tablet (5 mg total) by mouth 2 (two) times daily. 60 tablet 3  . empagliflozin (JARDIANCE) 10 MG TABS tablet Take 10 mg by mouth daily.    Marland Kitchen FIBER PO Take 2 tablets by mouth daily.    Marland Kitchen gabapentin (NEURONTIN) 300 MG capsule Take 300 mg by mouth 2 (two) times daily.    . hyaluronate sodium (RADIAPLEXRX) GEL Apply 1 application topically 2 (two) times daily.    . insulin glargine (LANTUS) 100 UNIT/ML injection Inject 24 Units into the skin at bedtime.    Marland Kitchen letrozole (FEMARA) 2.5 MG tablet Take 1 tablet (2.5 mg total) by mouth daily. 90 tablet 3  . LUMIGAN 0.01 %  SOLN Place 1 drop into both eyes at bedtime.     Marland Kitchen nystatin-triamcinolone ointment (MYCOLOG) Apply 1 application topically 2 (two) times daily. 30 g 0  . rosuvastatin (CRESTOR) 40 MG tablet Take 40 mg by mouth daily.  0  . terconazole (TERAZOL 7) 0.4 % vaginal cream Place 1 applicator vaginally at bedtime. For 7 nights 45 g 0   No current facility-administered medications for this encounter.     Allergies:   Patient has no known allergies.   Social History:  The patient  reports that she has never smoked. She has never used  smokeless tobacco. She reports that she does not drink alcohol or use drugs.   Family History:  The patient's  family history includes Alzheimer's disease in her mother; Lung cancer in her father.    ROS:  Please see the history of present illness.   All other systems are personally reviewed and negative.    PHYSICAL EXAM: VS:  BP 122/66   Pulse 67   Ht 5\' 2"  (1.575 m)   Wt 67.6 kg   BMI 27.25 kg/m  , BMI Body mass index is 27.25 kg/m. GEN: Well nourished, well developed, in no acute distress  HEENT: normal  Neck: no JVD, carotid bruits, or masses Cardiac: iRRR; no murmurs, rubs, or gallops,no edema  Respiratory:  clear to auscultation bilaterally, normal work of breathing GI: soft, nontender, nondistended, + BS MS: no deformity or atrophy  Skin: warm and dry  Neuro:  Strength and sensation are intact Psych: euthymic mood, full affect  EKG:   SR at 67 bpm, pr int 168 ms, qrs int 74 ms, qtc 429 ms   Recent Labs: 02/08/2018: ALT 24 05/25/2018: BUN 18; Creatinine, Ser 0.99; Hemoglobin 14.0; Platelets 172; Potassium 4.2; Sodium 139  personally reviewed   Lipid Panel  No results found for: CHOL, TRIG, HDL, CHOLHDL, VLDL, LDLCALC, LDLDIRECT personally reviewed   Wt Readings from Last 3 Encounters:  06/05/18 67.6 kg  05/31/18 66.7 kg  05/29/18 67.1 kg      Other studies personally reviewed: Additional studies/ records that were reviewed today include: AF clinic notes, recent echo  Review of the above records today demonstrates: as above   ASSESSMENT AND PLAN:  1.  Persistent afib Successful cardioversion Does not appreciate that she felt any better in SR chads2vasc score is at least 6.  Continue eliquis 5 mg bid   2. Breast CA  Finished radiation and tolerated  well Per oncology  Follow-up in afib clinic one month    Labs/ tests ordered today include:  Orders Placed This Encounter  Procedures  . EKG 12-Lead     Geroge Baseman. Jerone Cudmore, [facility] [address] [place], [place] [address] [phone]

## 2018-06-15 ENCOUNTER — Ambulatory Visit (HOSPITAL_COMMUNITY): Payer: Medicare HMO | Admitting: Nurse Practitioner

## 2018-06-20 ENCOUNTER — Ambulatory Visit (HOSPITAL_COMMUNITY): Payer: Medicare HMO | Admitting: Nurse Practitioner

## 2018-06-21 ENCOUNTER — Ambulatory Visit: Payer: Medicare HMO | Admitting: Radiation Oncology

## 2018-06-23 ENCOUNTER — Ambulatory Visit: Payer: Self-pay | Admitting: Radiation Oncology

## 2018-06-29 ENCOUNTER — Encounter: Payer: Self-pay | Admitting: Radiation Oncology

## 2018-06-29 NOTE — Progress Notes (Signed)
Dorothy Herring presents for follow up of radiation completed 05/24/18 to her Left Breast. She saw Dr. Lindi Adie on 05/23/18 and began Letrozole 1 mg on 06/20/18. She reports hot flashes. She will see survivorship on 08/22/18. She continues to have fatigue. She reports her skin has healed. She is currently is using vitamin A & D ointment to her left breast. I have advised the use of a vitamin E containing lotion.   BP (!) 120/49 (BP Location: Left Arm, Patient Position: Sitting)   Pulse 87   Temp 97.7 F (36.5 C) (Oral)   Resp 20   Ht 5\' 3"  (1.6 m)   Wt 150 lb 12.8 oz (68.4 kg)   SpO2 100%   BMI 26.71 kg/m    Wt Readings from Last 3 Encounters:  07/05/18 150 lb 12.8 oz (68.4 kg)  07/05/18 149 lb (67.6 kg)  06/05/18 149 lb (67.6 kg)

## 2018-07-05 ENCOUNTER — Telehealth: Payer: Self-pay | Admitting: Cardiology

## 2018-07-05 ENCOUNTER — Encounter: Payer: Self-pay | Admitting: Radiation Oncology

## 2018-07-05 ENCOUNTER — Ambulatory Visit (HOSPITAL_BASED_OUTPATIENT_CLINIC_OR_DEPARTMENT_OTHER)
Admission: RE | Admit: 2018-07-05 | Discharge: 2018-07-05 | Disposition: A | Discharge status: Home / Self Care | Payer: Medicare HMO | Source: Ambulatory Visit | Attending: Nurse Practitioner | Admitting: Nurse Practitioner

## 2018-07-05 ENCOUNTER — Other Ambulatory Visit: Payer: Self-pay

## 2018-07-05 ENCOUNTER — Ambulatory Visit
Admission: RE | Admit: 2018-07-05 | Discharge: 2018-07-05 | Disposition: A | Discharge status: Home / Self Care | Payer: Medicare HMO | Source: Ambulatory Visit | Attending: Radiation Oncology | Admitting: Radiation Oncology

## 2018-07-05 ENCOUNTER — Ambulatory Visit (HOSPITAL_COMMUNITY): Payer: Medicare HMO | Admitting: Nurse Practitioner

## 2018-07-05 ENCOUNTER — Encounter (HOSPITAL_COMMUNITY): Payer: Self-pay | Admitting: Nurse Practitioner

## 2018-07-05 VITALS — BP 110/72 | HR 86 | Ht 62.0 in | Wt 149.0 lb

## 2018-07-05 DIAGNOSIS — E119 Type 2 diabetes mellitus without complications: Secondary | ICD-10-CM | POA: Diagnosis not present

## 2018-07-05 DIAGNOSIS — Z79899 Other long term (current) drug therapy: Secondary | ICD-10-CM | POA: Diagnosis not present

## 2018-07-05 DIAGNOSIS — I4891 Unspecified atrial fibrillation: Secondary | ICD-10-CM | POA: Diagnosis present

## 2018-07-05 DIAGNOSIS — Z8673 Personal history of transient ischemic attack (TIA), and cerebral infarction without residual deficits: Secondary | ICD-10-CM | POA: Diagnosis not present

## 2018-07-05 DIAGNOSIS — I4819 Other persistent atrial fibrillation: Secondary | ICD-10-CM | POA: Insufficient documentation

## 2018-07-05 DIAGNOSIS — Z7901 Long term (current) use of anticoagulants: Secondary | ICD-10-CM | POA: Diagnosis not present

## 2018-07-05 DIAGNOSIS — Z794 Long term (current) use of insulin: Secondary | ICD-10-CM | POA: Diagnosis not present

## 2018-07-05 DIAGNOSIS — Z08 Encounter for follow-up examination after completed treatment for malignant neoplasm: Secondary | ICD-10-CM | POA: Diagnosis not present

## 2018-07-05 DIAGNOSIS — D0512 Intraductal carcinoma in situ of left breast: Secondary | ICD-10-CM

## 2018-07-05 DIAGNOSIS — Z853 Personal history of malignant neoplasm of breast: Secondary | ICD-10-CM | POA: Diagnosis not present

## 2018-07-05 HISTORY — DX: Personal history of irradiation: Z92.3

## 2018-07-05 NOTE — Addendum Note (Signed)
Encounter addended by: Sherran Needs, NP on: 07/05/2018 11:38 AM  Actions taken: Sign clinical note

## 2018-07-05 NOTE — Telephone Encounter (Signed)
Received outpatient page regarding patients HR. Pt states that she was recently on a trip with some friends and they had a "HR app"

## 2018-07-05 NOTE — Progress Notes (Signed)
  Radiation Oncology         [phone] ________________________________  Name: Dorothy Herring MRN: [id number]  Date: 07/05/2018  DOB: 12/12/1940  Follow-Up Visit Note  Outpatient  CC: Shon Baton, MD  Nicholas Lose, MD  Diagnosis and Prior Radiotherapy:    ICD-10-CM   1. Ductal carcinoma in situ (DCIS) of left breast D05.12     CHIEF COMPLAINT: Here for follow-up and surveillance of breast DCIS, left  Narrative:  The patient returns today for routine follow-up.  She completed RT to the left breast last month. Doing well.  Taking Letrozole.  ALLERGIES:  has No Known Allergies.  Meds: Current Outpatient Medications  Medication Sig Dispense Refill  . apixaban (ELIQUIS) 5 MG TABS tablet Take 1 tablet (5 mg total) by mouth 2 (two) times daily. 60 tablet 3  . empagliflozin (JARDIANCE) 10 MG TABS tablet Take 10 mg by mouth daily.    Marland Kitchen FIBER PO Take 2 tablets by mouth daily.    Marland Kitchen gabapentin (NEURONTIN) 300 MG capsule Take 300 mg by mouth 2 (two) times daily.    . insulin glargine (LANTUS) 100 UNIT/ML injection Inject 24 Units into the skin at bedtime.    Marland Kitchen letrozole (FEMARA) 2.5 MG tablet Take 1 tablet (2.5 mg total) by mouth daily. 90 tablet 3  . LUMIGAN 0.01 % SOLN Place 1 drop into both eyes at bedtime.     . rosuvastatin (CRESTOR) 40 MG tablet Take 40 mg by mouth daily.  0  . hyaluronate sodium (RADIAPLEXRX) GEL Apply 1 application topically 2 (two) times daily.    Marland Kitchen nystatin-triamcinolone ointment (MYCOLOG) Apply 1 application topically 2 (two) times daily. (Patient not taking: Reported on 07/05/2018) 30 g 0  . terconazole (TERAZOL 7) 0.4 % vaginal cream Place 1 applicator vaginally at bedtime. For 7 nights (Patient not taking: Reported on 07/05/2018) 45 g 0   No current facility-administered medications for this encounter.     Physical Findings: The patient is in no acute distress. Patient is alert and oriented.  height is 5\' 3"  (1.6 m) and weight is 150 lb 12.8 oz (68.4  kg). Her oral temperature is 97.7 F (36.5 C). Her blood pressure is 120/49 (abnormal) and her pulse is 87. Her respiration is 20 and oxygen saturation is 100%. .    Satisfactory skin healing in radiotherapy fields. Mild dryness of left breast. Mild erythema at IM fold.   Lab Findings: Lab Results  Component Value Date   WBC 5.4 05/25/2018   HGB 14.0 05/25/2018   HCT 43.7 05/25/2018   MCV 87.9 05/25/2018   PLT 172 05/25/2018    Radiographic Findings: No results found.  Impression/Plan: Healing well from radiotherapy to the breast tissue.  Continue skin care with topical Vitamin E lotion for at least 2 more months for further healing.  I encouraged her to continue with yearly mammography and followup with medical oncology. I will see her back on an as-needed basis. I have encouraged her to call if she has any issues or concerns in the future. I wished her the very best.     Eppie Gibson, MD

## 2018-07-05 NOTE — Patient Instructions (Signed)
Your physician has requested that you have an echocardiogram. Echocardiography is a painless test that uses sound waves to create images of your heart. It provides your doctor with information about the size and shape of your heart and how well your heart's chambers and valves are working. This procedure takes approximately one hour. There are no restrictions for this procedure.  Your echo is scheduled for Wednesday November 20th at 11 a.m.

## 2018-07-05 NOTE — Telephone Encounter (Signed)
Received outpatient page regarding pt's HR. Pt states that she was recently on a trip with some friends who had "HR apps" on their phones. The pt was sitting watching TV this evening and decided to try the app. She noted that her HR ranged from 60-110 and she was without symptoms. She was seen by Dorothy Herring in the AF clinic today and her HR was 86 per chart review. She had a recent DCCV but was back in AF at her appointment. She is on anticoagulation and is noted to not be on any rate lowering medications. She was instructed to monitor her HR intermittently and then she will see Dorothy Herring back in the office in one month. She was instructed to call sooner if her HR sustains in the 120's or above or if she develops symptoms. She understands and is receptive to this plan.    Kathyrn Drown NP-C [place] Pager: [phone]

## 2018-07-05 NOTE — Progress Notes (Addendum)
Electrophysiology Office Note   Date:  07/05/2018   ID:  Shamra, Bradeen [birth year], MRN [id number]  PCP:  Shon Baton, MD  Cardiologist:  none Primary Electrophysiologist:Dr. Rayann Heman     CC: afib   History of Present Illness: Dorothy Herring is a 77 y.o. female with h/o afib, successful 9/11  cardioversion.   She was unaware with dx of afib, asymptomatic. She did not feel any different in SR. She was seen one week after cardioversion and was staying in [place]. However, at one month f/u, she has reverted back to afib. Totally unaware, feels well. She has finished  breast CA tx several weeks ago.  She remains on eliquis.  Today, she denies symptoms of palpitations, chest pain, shortness of breath, orthopnea, PND, lower extremity edema, claudication, dizziness, presyncope, syncope, bleeding, or neurologic sequela. The patient is tolerating medications without difficulties and is otherwise without complaint today.    Past Medical History:  Diagnosis Date  . Cancer ([place])   . Diabetes mellitus   . History of radiation therapy 05/02/18- 05/24/18   Left Breast 42.56 Gy in 16 fractions.   . Hyperlipidemia   . Persistent atrial fibrillation   . PONV (postoperative nausea and vomiting)   . TIA (transient ischemic attack)    seven years ago   Past Surgical History:  Procedure Laterality Date  . ABDOMINAL HYSTERECTOMY  age 12  . BILATERAL SALPINGOOPHORECTOMY     By patient history  . BREAST BIOPSY    . BREAST LUMPECTOMY WITH RADIOACTIVE SEED LOCALIZATION Left 02/27/2018   Procedure: BREAST LUMPECTOMY WITH RADIOACTIVE SEED LOCALIZATION;  Surgeon: Alphonsa Overall, MD;  Location: [facility];  Service: General;  Laterality: Left;  . CARDIOVERSION N/A 05/31/2018   Procedure: CARDIOVERSION;  Surgeon: [name], MD;  Location: [facility] ENDOSCOPY;  Service: Cardiovascular;  Laterality: N/A;     Current Outpatient Medications  Medication Sig Dispense Refill  . apixaban (ELIQUIS) 5  MG TABS tablet Take 1 tablet (5 mg total) by mouth 2 (two) times daily. 60 tablet 3  . empagliflozin (JARDIANCE) 10 MG TABS tablet Take 10 mg by mouth daily.    Marland Kitchen FIBER PO Take 2 tablets by mouth daily.    Marland Kitchen gabapentin (NEURONTIN) 300 MG capsule Take 300 mg by mouth 2 (two) times daily.    . hyaluronate sodium (RADIAPLEXRX) GEL Apply 1 application topically 2 (two) times daily.    . insulin glargine (LANTUS) 100 UNIT/ML injection Inject 24 Units into the skin at bedtime.    Marland Kitchen letrozole (FEMARA) 2.5 MG tablet Take 1 tablet (2.5 mg total) by mouth daily. 90 tablet 3  . LUMIGAN 0.01 % SOLN Place 1 drop into both eyes at bedtime.     Marland Kitchen nystatin-triamcinolone ointment (MYCOLOG) Apply 1 application topically 2 (two) times daily. 30 g 0  . rosuvastatin (CRESTOR) 40 MG tablet Take 40 mg by mouth daily.  0  . terconazole (TERAZOL 7) 0.4 % vaginal cream Place 1 applicator vaginally at bedtime. For 7 nights 45 g 0   No current facility-administered medications for this encounter.     Allergies:   Patient has no known allergies.   Social History:  The patient  reports that she has never smoked. She has never used smokeless tobacco. She reports that she does not drink alcohol or use drugs.   Family History:  The patient's  family history includes Alzheimer's disease in her mother; Lung cancer in her father.    ROS:  Please see the history of present illness.   All other systems are personally reviewed and negative.    PHYSICAL EXAM: VS:  BP 110/72   Pulse 86   Ht 5\' 2"  (1.575 m)   Wt 67.6 kg   BMI 27.25 kg/m  , BMI Body mass index is 27.25 kg/m. GEN: Well nourished, well developed, in no acute distress  HEENT: normal  Neck: no JVD, carotid bruits, or masses Cardiac: iRRR; no murmurs, rubs, or gallops,no edema  Respiratory:  clear to auscultation bilaterally, normal work of breathing GI: soft, nontender, nondistended, + BS MS: no deformity or atrophy  Skin: warm and dry  Neuro:  Strength  and sensation are intact Psych: euthymic mood, full affect  EKG:   afib at 86 bpm    Recent Labs: 02/08/2018: ALT 24 05/25/2018: BUN 18; Creatinine, Ser 0.99; Hemoglobin 14.0; Platelets 172; Potassium 4.2; Sodium 139  personally reviewed   Lipid Panel  No results found for: CHOL, TRIG, HDL, CHOLHDL, VLDL, LDLCALC, LDLDIRECT personally reviewed   Wt Readings from Last 3 Encounters:  07/05/18 67.6 kg  06/05/18 67.6 kg  05/31/18 66.7 kg      Other studies personally reviewed: Additional studies/ records that were reviewed today include: recent echo     ASSESSMENT AND PLAN:  1.  Persistent asymptomatic afib Successful cardioversion but has had return of afib at one month f/u Rate controlled and asymptomatic, did not feel improved with return of SR.  She does not want to purse any rhythm control strategies at this time Does not appreciate that she felt any better in SR chads2vasc score is at least 6.  Continue eliquis 5 mg bid   2. Breast CA  Finished radiation and tolerated  well Per oncology  I will recheck echo 6 months( NOV) from previous Echo to ascertain that afib is not causing any LV dysfunction. She iwill f/u in the office after echo and if EF is stable , rate control will be her strategy going forward.    Labs/ tests ordered today include:  Orders Placed This Encounter  Procedures  . EKG 12-Lead  . ECHOCARDIOGRAM COMPLETE     Butch Penny C. Camay Pedigo, [facility] [address] [place], [place] [address] [phone]

## 2018-07-07 DIAGNOSIS — Z23 Encounter for immunization: Secondary | ICD-10-CM | POA: Diagnosis not present

## 2018-07-07 NOTE — Telephone Encounter (Signed)
Returned pt call regarding complaints of severe hot flashes with letrozole. Pt states that she can't sleep at night and her hot flashes comes on almost every hr. She takes letrozole at night, but her hot flashes had been present prior to initiating AE therapy. She had bought an over the counter hot flash medication that wasn't working. Pt would like to know if she can switch to something else.  Suggested that pt stop taking letrozole for 1-2 weeks to see if she has any improvement. She is currently taking gabapentin for neuropathy BID. Told pt that we can increase the dose of gabapentin or have her try effexor to help alleviate hot flashes. Pt refused to increase gabapentin or try effexor at this time. She will be leaving out of town for 2 weeks and would rather stop taking letrozole. Pt will call when she is back in town. Dr.Gudena was notified and is aware.

## 2018-07-13 ENCOUNTER — Other Ambulatory Visit: Payer: Self-pay

## 2018-07-13 DIAGNOSIS — E2839 Other primary ovarian failure: Secondary | ICD-10-CM

## 2018-07-13 DIAGNOSIS — Z9889 Other specified postprocedural states: Secondary | ICD-10-CM

## 2018-07-13 DIAGNOSIS — D0512 Intraductal carcinoma in situ of left breast: Secondary | ICD-10-CM

## 2018-07-13 NOTE — Progress Notes (Signed)
Pt called to request her MM and Bone density to be ordered. Pt is not due for MM until next yr, but is due for her bone density. Pt gets all scans from [facility] mammogram. Will fax order for density today per Dr. Geralyn Flash approval. Pt thankful for the time and call.

## 2018-07-24 ENCOUNTER — Other Ambulatory Visit: Payer: Self-pay

## 2018-07-24 MED ORDER — ANASTROZOLE 1 MG PO TABS: 1.0000 mg | ORAL_TABLET | Freq: Every day | ORAL | 0 refills | Status: DC

## 2018-07-24 NOTE — Progress Notes (Signed)
Pt calling to report that her hot flashes have significantly decreased to 13 a day, down to 2 a day, after stopping letrozole for 2 weeks. Pt would like another alternative treatment medication to be sent to her pharmacy today.Obtained order for anastrozole from [facility] to send to her pharmacy. Advised pt to start every other night before bedtime for 1 week and increase to every night. Gave pt 30 day supply for now, until she calls to report on how she is doing with anastrozole. No further needs at this time.

## 2018-08-09 ENCOUNTER — Ambulatory Visit (HOSPITAL_COMMUNITY)
Admission: RE | Admit: 2018-08-09 | Discharge: 2018-08-09 | Disposition: A | Discharge status: Home / Self Care | Payer: Medicare HMO | Source: Ambulatory Visit | Attending: Nurse Practitioner | Admitting: Nurse Practitioner

## 2018-08-09 DIAGNOSIS — Z8673 Personal history of transient ischemic attack (TIA), and cerebral infarction without residual deficits: Secondary | ICD-10-CM | POA: Insufficient documentation

## 2018-08-09 DIAGNOSIS — I071 Rheumatic tricuspid insufficiency: Secondary | ICD-10-CM | POA: Insufficient documentation

## 2018-08-09 DIAGNOSIS — I4819 Other persistent atrial fibrillation: Secondary | ICD-10-CM | POA: Insufficient documentation

## 2018-08-09 DIAGNOSIS — E119 Type 2 diabetes mellitus without complications: Secondary | ICD-10-CM | POA: Insufficient documentation

## 2018-08-09 DIAGNOSIS — E785 Hyperlipidemia, unspecified: Secondary | ICD-10-CM | POA: Insufficient documentation

## 2018-08-09 DIAGNOSIS — Z853 Personal history of malignant neoplasm of breast: Secondary | ICD-10-CM | POA: Diagnosis not present

## 2018-08-09 NOTE — Progress Notes (Signed)
Echocardiogram 2D Echocardiogram has been performed.  Matilde Bash 08/09/2018, 11:16 AM

## 2018-08-12 ENCOUNTER — Other Ambulatory Visit: Payer: Self-pay | Admitting: Hematology and Oncology

## 2018-08-14 DIAGNOSIS — E1169 Type 2 diabetes mellitus with other specified complication: Secondary | ICD-10-CM | POA: Diagnosis not present

## 2018-08-14 DIAGNOSIS — E11319 Type 2 diabetes mellitus with unspecified diabetic retinopathy without macular edema: Secondary | ICD-10-CM | POA: Diagnosis not present

## 2018-08-14 DIAGNOSIS — I4892 Unspecified atrial flutter: Secondary | ICD-10-CM | POA: Diagnosis not present

## 2018-08-14 DIAGNOSIS — M859 Disorder of bone density and structure, unspecified: Secondary | ICD-10-CM | POA: Diagnosis not present

## 2018-08-14 DIAGNOSIS — E1165 Type 2 diabetes mellitus with hyperglycemia: Secondary | ICD-10-CM | POA: Diagnosis not present

## 2018-08-14 DIAGNOSIS — I10 Essential (primary) hypertension: Secondary | ICD-10-CM | POA: Diagnosis not present

## 2018-08-14 DIAGNOSIS — R69 Illness, unspecified: Secondary | ICD-10-CM | POA: Diagnosis not present

## 2018-08-14 DIAGNOSIS — Z6825 Body mass index (BMI) 25.0-25.9, adult: Secondary | ICD-10-CM | POA: Diagnosis not present

## 2018-08-14 DIAGNOSIS — D0512 Intraductal carcinoma in situ of left breast: Secondary | ICD-10-CM | POA: Diagnosis not present

## 2018-08-14 DIAGNOSIS — Z794 Long term (current) use of insulin: Secondary | ICD-10-CM | POA: Diagnosis not present

## 2018-08-16 ENCOUNTER — Telehealth: Payer: Self-pay

## 2018-08-16 NOTE — Telephone Encounter (Signed)
Spoke with patient reminding of SCP visit with NP on 08/22/18 at 10 am.  Patient says she will come to appt.

## 2018-08-22 ENCOUNTER — Inpatient Hospital Stay: Payer: Medicare HMO | Attending: Hematology and Oncology | Admitting: Adult Health

## 2018-08-22 DIAGNOSIS — M859 Disorder of bone density and structure, unspecified: Secondary | ICD-10-CM | POA: Diagnosis not present

## 2018-08-22 NOTE — Progress Notes (Deleted)
CLINIC:  Survivorship   REASON FOR VISIT:  Routine follow-up post-treatment for a recent history of breast cancer.  BRIEF ONCOLOGIC HISTORY:    Ductal carcinoma in situ (DCIS) of left breast   02/01/2018 Initial Diagnosis    Left nipple discharge, mammogram showing asymmetry, ultrasound reveals 0.5 cm area of concern at 12 o'clock position 1 cm from the nipple, axilla negative, biopsy revealed intermediate grade DCIS ER 100%, PR 100%, Tis N0 stage 0 clinical stage    02/27/2018 Surgery    Left lumpectomy: DCIS with calcifications, intermediate grade, 0.6 cm, margins negative, ER 100%, PR 100%, Tis N0 stage 0     05/02/2018 - 05/24/2018 Radiation Therapy    Adjuvant radiation therapy   Left Breast / 42.56 Gy in 16 fractions    06/2018 -  Anti-estrogen oral therapy    Anastrozole daily     INTERVAL HISTORY:  Dorothy Herring presents to the [facility] today for our initial meeting to review her survivorship care plan detailing her treatment course for breast cancer, as well as monitoring long-term side effects of that treatment, education regarding health maintenance, screening, and overall wellness and health promotion.     Overall, Dorothy Herring reports feeling quite well since completing her radiation therapy approximately 3 months ago.  She ***    REVIEW OF SYSTEMS:  Review of Systems - Oncology Breast: Denies any new nodularity, masses, tenderness, nipple changes, or nipple discharge.      ONCOLOGY TREATMENT TEAM:  1. Surgeon:  Dr. Marland Kitchen at [facility] 2. Medical Oncologist: Dr. Marland Kitchen  3. Radiation Oncologist: Dr. Marland Kitchen    PAST MEDICAL/SURGICAL HISTORY:  Past Medical History:  Diagnosis Date  . Cancer ([place])   . Diabetes mellitus   . History of radiation therapy 05/02/18- 05/24/18   Left Breast 42.56 Gy in 16 fractions.   . Hyperlipidemia   . Persistent atrial fibrillation   . PONV (postoperative nausea and vomiting)   . TIA (transient ischemic attack)    seven years ago   Past Surgical History:  Procedure Laterality Date  . ABDOMINAL HYSTERECTOMY  age 27  . BILATERAL SALPINGOOPHORECTOMY     By patient history  . BREAST BIOPSY    . BREAST LUMPECTOMY WITH RADIOACTIVE SEED LOCALIZATION Left 02/27/2018   Procedure: BREAST LUMPECTOMY WITH RADIOACTIVE SEED LOCALIZATION;  Surgeon: Alphonsa Overall, MD;  Location: [place];  Service: General;  Laterality: Left;  . CARDIOVERSION N/A 05/31/2018   Procedure: CARDIOVERSION;  Surgeon: [name], MD;  Location: [place];  Service: Cardiovascular;  Laterality: N/A;     ALLERGIES:  No Known Allergies   CURRENT MEDICATIONS:  Outpatient Encounter Medications as of 08/22/2018  Medication Sig Note  . anastrozole (ARIMIDEX) 1 MG tablet Take 1 tablet (1 mg total) by mouth daily.   Marland Kitchen apixaban (ELIQUIS) 5 MG TABS tablet Take 1 tablet (5 mg total) by mouth 2 (two) times daily.   . empagliflozin (JARDIANCE) 10 MG TABS tablet Take 10 mg by mouth daily.   Marland Kitchen FIBER PO Take 2 tablets by mouth daily.   Marland Kitchen gabapentin (NEURONTIN) 300 MG capsule Take 300 mg by mouth 2 (two) times daily.   . hyaluronate sodium (RADIAPLEXRX) GEL Apply 1 application topically 2 (two) times daily.   . insulin glargine (LANTUS) 100 UNIT/ML injection Inject 24 Units into the skin at bedtime. 05/31/2018: 12 units  . LUMIGAN 0.01 % SOLN Place 1 drop into both eyes at bedtime.    Marland Kitchen nystatin-triamcinolone ointment (  MYCOLOG) Apply 1 application topically 2 (two) times daily. (Patient not taking: Reported on 07/05/2018)   . rosuvastatin (CRESTOR) 40 MG tablet Take 40 mg by mouth daily.   Marland Kitchen terconazole (TERAZOL 7) 0.4 % vaginal cream Place 1 applicator vaginally at bedtime. For 7 nights (Patient not taking: Reported on 07/05/2018)    No facility-administered encounter medications on file as of 08/22/2018.      ONCOLOGIC FAMILY HISTORY:  Family History  Problem Relation Age of Onset  . Lung cancer Father   . Alzheimer's  disease Mother   . Colon cancer Neg Hx      GENETIC COUNSELING/TESTING: ***  SOCIAL HISTORY:  Dorothy Herring is /single/married/divorced/widowed/separated and lives alone/with her spouse/family/friend in (city), [place].  She has (#) children and they live in (city).  Dorothy Herring is currently retired/disabled/working part-time/full-time as ***.  She denies any current or history of tobacco, alcohol, or illicit drug use.     PHYSICAL EXAMINATION:  Vital Signs:  There were no vitals filed for this visit. There were no vitals filed for this visit. General: Well-nourished, well-appearing female in no acute distress.  She is unaccompanied/accompanied in clinic by her ***** today.   HEENT: Head is normocephalic.  Pupils equal and reactive to light. Conjunctivae clear without exudate.  Sclerae anicteric. Oral mucosa is pink, moist.  Oropharynx is pink without lesions or erythema.  Lymph: No cervical, supraclavicular, or infraclavicular lymphadenopathy noted on palpation.  Cardiovascular: Regular rate and rhythm.Marland Kitchen Respiratory: Clear to auscultation bilaterally. Chest expansion symmetric; breathing non-labored.  GI: Abdomen soft and round; non-tender, non-distended. Bowel sounds normoactive.  GU: Deferred.  Neuro: No focal deficits. Steady gait.  Psych: Mood and affect normal and appropriate for situation.  Extremities: No edema. MSK: No focal spinal tenderness to palpation.  Full range of motion in bilateral upper extremities Skin: Warm and dry.  LABORATORY DATA:  None for this visit.  DIAGNOSTIC IMAGING:  None for this visit.      ASSESSMENT AND PLAN:  Ms.. Herring is a pleasant 77 y.o. female with Stage 0 left breast DCIS, ER+/PR+, diagnosed in 01/2018, treated with lumpectomy, adjuvant radiation therapy, and anti-estrogen therapy with Anastrozole beginning in 06/2018.  She presents to the Survivorship Clinic for our initial meeting and routine follow-up post-completion of  treatment for breast cancer.    1. Stage 0 left breast cancer:  Dorothy Herring is continuing to recover from definitive treatment for breast cancer. She will follow-up with her medical oncologist, Dr. Lindi Adie in 6 months with history and physical exam per surveillance protocol.  She will continue her anti-estrogen therapy with Anastrozole. Thus far, she is tolerating the Anastrozole well, with minimal side effects. She was instructed to make Dr. Lindi Adie or myself aware if she begins to experience any worsening side effects of the medication and I could see her back in clinic to help manage those side effects, as needed. Though the incidence is low, there is an associated risk of endometrial cancer with anti-estrogen therapies like Tamoxifen.  Dorothy Herring was encouraged to contact Dr. Carrington Clamp or myself with any vaginal bleeding while taking Tamoxifen. Other side effects of Tamoxifen were again reviewed with her as well. Today, a comprehensive survivorship care plan and treatment summary was reviewed with the patient today detailing her breast cancer diagnosis, treatment course, potential late/long-term effects of treatment, appropriate follow-up care with recommendations for the future, and patient education resources.  A copy of this summary, along with a letter will be sent to  the patient's primary care provider via mail/fax/In Basket message after today's visit.    #. Problem(s) at Visit______________  #. Bone health:  Given Dorothy Herring age/history of breast cancer and her current treatment regimen including anti-estrogen therapy with Anastrozole, she is at risk for bone demineralization.  Her last DEXA scan was 06/23/2016 and showed osteopenia with a T score of -1.2 in the right femoral neck.  In the meantime, she was encouraged to increase her consumption of foods rich in calcium, as well as increase her weight-bearing activities.  She was given education on specific activities to promote bone  health.  #. Cancer screening:  Due to Dorothy Herring's history and her age, she should receive screening for skin cancers, colon cancer, and gynecologic cancers.  The information and recommendations are listed on the patient's comprehensive care plan/treatment summary and were reviewed in detail with the patient.    #. Health maintenance and wellness promotion: Dorothy Herring was encouraged to consume 5-7 servings of fruits and vegetables per day. We reviewed the "Nutrition Rainbow" handout, as well as the handout "Take Control of Your Health and Reduce Your Cancer Risk" from the [place].  She was also encouraged to engage in moderate to vigorous exercise for 30 minutes per day most days of the week. We discussed the [employer] fitness program, which is designed for cancer survivors to help them become more physically fit after cancer treatments.  She was instructed to limit her alcohol consumption and continue to abstain from tobacco use/***was encouraged stop smoking.     #. Support services/counseling: It is not uncommon for this period of the patient's cancer care trajectory to be one of many emotions and stressors.  We discussed an opportunity for her to participate in the next session of [facility] ("Finding Your New Normal") support group series designed for patients after they have completed treatment.   Dorothy Herring was encouraged to take advantage of our many other support services programs, support groups, and/or counseling in coping with her new life as a cancer survivor after completing anti-cancer treatment.  She was offered support today through active listening and expressive supportive counseling.  She was given information regarding our available services and encouraged to contact me with any questions or for help enrolling in any of our support group/programs.    Dispo:   -Return to cancer center ***  -Mammogram due in *** -Follow up with surgery *** -She is welcome to return  back to the Survivorship Clinic at any time; no additional follow-up needed at this time.  -Consider referral back to survivorship as a long-term survivor for continued surveillance  A total of (30) minutes of face-to-face time was spent with this patient with greater than 50% of that time in counseling and care-coordination.   Gardenia Phlegm, [place] [phone]   Note: PRIMARY CARE PROVIDER Shon Baton, [place] [phone]

## 2018-08-24 ENCOUNTER — Other Ambulatory Visit: Payer: Self-pay

## 2018-08-24 MED ORDER — ANASTROZOLE 1 MG PO TABS: 1.0000 mg | ORAL_TABLET | Freq: Every day | ORAL | 0 refills | Status: DC

## 2018-08-30 ENCOUNTER — Ambulatory Visit (HOSPITAL_COMMUNITY)
Admission: RE | Admit: 2018-08-30 | Discharge: 2018-08-30 | Disposition: A | Discharge status: Home / Self Care | Payer: Medicare HMO | Source: Ambulatory Visit | Attending: Nurse Practitioner | Admitting: Nurse Practitioner

## 2018-08-30 ENCOUNTER — Encounter (HOSPITAL_COMMUNITY): Payer: Self-pay | Admitting: Nurse Practitioner

## 2018-08-30 VITALS — BP 118/74 | HR 94 | Ht 63.0 in | Wt 143.0 lb

## 2018-08-30 DIAGNOSIS — Z79899 Other long term (current) drug therapy: Secondary | ICD-10-CM | POA: Diagnosis not present

## 2018-08-30 DIAGNOSIS — Z923 Personal history of irradiation: Secondary | ICD-10-CM | POA: Insufficient documentation

## 2018-08-30 DIAGNOSIS — I4819 Other persistent atrial fibrillation: Secondary | ICD-10-CM

## 2018-08-30 DIAGNOSIS — Z8673 Personal history of transient ischemic attack (TIA), and cerebral infarction without residual deficits: Secondary | ICD-10-CM | POA: Insufficient documentation

## 2018-08-30 DIAGNOSIS — E119 Type 2 diabetes mellitus without complications: Secondary | ICD-10-CM | POA: Insufficient documentation

## 2018-08-30 DIAGNOSIS — Z853 Personal history of malignant neoplasm of breast: Secondary | ICD-10-CM | POA: Diagnosis not present

## 2018-08-30 DIAGNOSIS — Z794 Long term (current) use of insulin: Secondary | ICD-10-CM | POA: Diagnosis not present

## 2018-08-30 DIAGNOSIS — E785 Hyperlipidemia, unspecified: Secondary | ICD-10-CM | POA: Diagnosis not present

## 2018-08-30 DIAGNOSIS — Z7901 Long term (current) use of anticoagulants: Secondary | ICD-10-CM | POA: Diagnosis not present

## 2018-08-30 NOTE — Progress Notes (Addendum)
Date:  08/30/2018   ID:  Belina, Mandile [birth year], MRN [id number]  PCP:  Shon Baton, MD  Cardiologist:  none Primary Electrophysiologist:Dr. Rayann Heman     CC: afib   History of Present Illness: Dorothy Herring is a 77 y.o. female with h/o afib, successful 9/11  cardioversion.   She was unaware with dx of afib, asymptomatic. She did not feel any different in SR. She was seen one week after cardioversion and was staying in [place]. However, at one month f/u, she has reverted back to afib. Totally unaware, feels well. She has finished  breast CA tx several weeks ago.  She remains on eliquis.  F/u in afib clinic 12/11. She had an echo which looked stable with normal EF.She continues in afib ,rate controlled and does not feel bad in  Afib. She  wishes to continue with rate controlled afib.  Today, she denies symptoms of palpitations, chest pain, shortness of breath, orthopnea, PND, lower extremity edema, claudication, dizziness, presyncope, syncope, bleeding, or neurologic sequela. The patient is tolerating medications without difficulties and is otherwise without complaint today.    Past Medical History:  Diagnosis Date  . Cancer ([place])   . Diabetes mellitus   . History of radiation therapy 05/02/18- 05/24/18   Left Breast 42.56 Gy in 16 fractions.   . Hyperlipidemia   . Persistent atrial fibrillation   . PONV (postoperative nausea and vomiting)   . TIA (transient ischemic attack)    seven years ago   Past Surgical History:  Procedure Laterality Date  . ABDOMINAL HYSTERECTOMY  age 72  . BILATERAL SALPINGOOPHORECTOMY     By patient history  . BREAST BIOPSY    . BREAST LUMPECTOMY WITH RADIOACTIVE SEED LOCALIZATION Left 02/27/2018   Procedure: BREAST LUMPECTOMY WITH RADIOACTIVE SEED LOCALIZATION;  Surgeon: Alphonsa Overall, MD;  Location: [place];  Service: General;  Laterality: Left;  . CARDIOVERSION N/A 05/31/2018   Procedure: CARDIOVERSION;  Surgeon: [name],  MD;  Location: [facility] ENDOSCOPY;  Service: Cardiovascular;  Laterality: N/A;     Current Outpatient Medications  Medication Sig Dispense Refill  . anastrozole (ARIMIDEX) 1 MG tablet Take 1 tablet (1 mg total) by mouth daily. 90 tablet 0  . apixaban (ELIQUIS) 5 MG TABS tablet Take 1 tablet (5 mg total) by mouth 2 (two) times daily. 60 tablet 3  . empagliflozin (JARDIANCE) 10 MG TABS tablet Take 10 mg by mouth daily.    Marland Kitchen FIBER PO Take 2 tablets by mouth daily.    Marland Kitchen gabapentin (NEURONTIN) 300 MG capsule Take 300 mg by mouth 2 (two) times daily.    . hyaluronate sodium (RADIAPLEXRX) GEL Apply 1 application topically 2 (two) times daily.    . insulin glargine (LANTUS) 100 UNIT/ML injection Inject 24 Units into the skin at bedtime.    Marland Kitchen LUMIGAN 0.01 % SOLN Place 1 drop into both eyes at bedtime.     Marland Kitchen nystatin-triamcinolone ointment (MYCOLOG) Apply 1 application topically 2 (two) times daily. 30 g 0  . rosuvastatin (CRESTOR) 40 MG tablet Take 40 mg by mouth daily.  0  . terconazole (TERAZOL 7) 0.4 % vaginal cream Place 1 applicator vaginally at bedtime. For 7 nights 45 g 0   No current facility-administered medications for this encounter.     Allergies:   Patient has no known allergies.   Social History:  The patient  reports that she has never smoked. She has never used smokeless tobacco. She reports  that she does not drink alcohol or use drugs.   Family History:  The patient's  family history includes Alzheimer's disease in her mother; Lung cancer in her father.    ROS:  Please see the history of present illness.   All other systems are personally reviewed and negative.    PHYSICAL EXAM: VS:  BP 118/74   Pulse 94   Ht 5\' 3"  (1.6 m)   Wt 64.9 kg   BMI 25.33 kg/m  , BMI Body mass index is 25.33 kg/m. GEN: Well nourished, well developed, in no acute distress  HEENT: normal  Neck: no JVD, carotid bruits, or masses Cardiac: iRRR; no murmurs, rubs, or gallops,no edema  Respiratory:   clear to auscultation bilaterally, normal work of breathing GI: soft, nontender, nondistended, + BS MS: no deformity or atrophy  Skin: warm and dry  Neuro:  Strength and sensation are intact Psych: euthymic mood, full affect  EKG:   afib at 94 bpm   Recent Labs: 02/08/2018: ALT 24 05/25/2018: BUN 18; Creatinine, Ser 0.99; Hemoglobin 14.0; Platelets 172; Potassium 4.2; Sodium 139  personally reviewed   Lipid Panel  No results found for: CHOL, TRIG, HDL, CHOLHDL, VLDL, LDLCALC, LDLDIRECT personally reviewed   Wt Readings from Last 3 Encounters:  08/30/18 64.9 kg  07/05/18 68.4 kg  07/05/18 67.6 kg      Other studies personally reviewed: Additional studies/ records that were reviewed today include: recent echo     ASSESSMENT AND PLAN:  1.  Persistent asymptomatic afib Successful cardioversion but has had return of afib at one month f/u Rate controlled and asymptomatic, did not feel improved with return of SR.  She does not want to purse any rhythm control strategies at this time Does not appreciate that she felt any better in SR chads2vasc score is at least 6.  Continue eliquis 5 mg bid  Recent echo shows stable EF  2. Breast CA  Finished radiation and tolerated  well Per oncology  Echo-Study Conclusions  - Left ventricle: The cavity size was normal. Systolic function was   normal. The estimated ejection fraction was in the range of 55%   to 60%. Wall motion was normal; there were no regional wall   motion abnormalities. - Mitral valve: There was trivial regurgitation. - Tricuspid valve: There was moderate regurgitation.  Impressions:  - Compared to the prior study, there has been no significant   interval change.   Labs/ tests ordered today include:  Orders Placed This Encounter  Procedures  . EKG 12-Lead   Pt would like to f/u with her PCP and afib clinic/Dr. Allred only as needed  Butch Penny C. Ama Mcmaster, [facility] [address] [place], [place] [address] [phone]

## 2018-09-19 DIAGNOSIS — R350 Frequency of micturition: Secondary | ICD-10-CM | POA: Diagnosis not present

## 2018-10-09 ENCOUNTER — Telehealth: Payer: Self-pay

## 2018-10-09 NOTE — Telephone Encounter (Signed)
Pt called to report that she is experiencing 15 hot flashes a day with anastrozole. Pt states that she is not able to sleep at night and is getting very miserable dealing with her symptoms. She would like to know if there is soemthing else she can take. Pt is currently on neurontin 1x per day for her neuropathy. Per Md, okay to increase dose to 3x a day to aid hot flashes symptoms. Pt will try this x 1 weeks and will call back with updates. No further needs at this time.

## 2018-10-16 DIAGNOSIS — Z794 Long term (current) use of insulin: Secondary | ICD-10-CM | POA: Diagnosis not present

## 2018-10-16 DIAGNOSIS — E118 Type 2 diabetes mellitus with unspecified complications: Secondary | ICD-10-CM | POA: Diagnosis not present

## 2018-11-08 DIAGNOSIS — L821 Other seborrheic keratosis: Secondary | ICD-10-CM | POA: Diagnosis not present

## 2018-11-08 DIAGNOSIS — L309 Dermatitis, unspecified: Secondary | ICD-10-CM | POA: Diagnosis not present

## 2018-11-08 DIAGNOSIS — L570 Actinic keratosis: Secondary | ICD-10-CM | POA: Diagnosis not present

## 2018-11-08 DIAGNOSIS — C44719 Basal cell carcinoma of skin of left lower limb, including hip: Secondary | ICD-10-CM | POA: Diagnosis not present

## 2018-11-08 DIAGNOSIS — D485 Neoplasm of uncertain behavior of skin: Secondary | ICD-10-CM | POA: Diagnosis not present

## 2018-11-08 DIAGNOSIS — Z85828 Personal history of other malignant neoplasm of skin: Secondary | ICD-10-CM | POA: Diagnosis not present

## 2018-11-11 ENCOUNTER — Other Ambulatory Visit: Payer: Self-pay | Admitting: Hematology and Oncology

## 2018-11-20 DIAGNOSIS — Z6825 Body mass index (BMI) 25.0-25.9, adult: Secondary | ICD-10-CM | POA: Diagnosis not present

## 2018-11-20 DIAGNOSIS — R197 Diarrhea, unspecified: Secondary | ICD-10-CM | POA: Diagnosis not present

## 2018-11-21 DIAGNOSIS — E113393 Type 2 diabetes mellitus with moderate nonproliferative diabetic retinopathy without macular edema, bilateral: Secondary | ICD-10-CM | POA: Diagnosis not present

## 2018-11-21 DIAGNOSIS — H43811 Vitreous degeneration, right eye: Secondary | ICD-10-CM | POA: Diagnosis not present

## 2018-11-21 DIAGNOSIS — H401131 Primary open-angle glaucoma, bilateral, mild stage: Secondary | ICD-10-CM | POA: Diagnosis not present

## 2018-11-21 DIAGNOSIS — H26491 Other secondary cataract, right eye: Secondary | ICD-10-CM | POA: Diagnosis not present

## 2018-11-22 DIAGNOSIS — R197 Diarrhea, unspecified: Secondary | ICD-10-CM | POA: Diagnosis not present

## 2018-11-28 ENCOUNTER — Encounter: Payer: Self-pay | Admitting: Physician Assistant

## 2018-12-06 ENCOUNTER — Ambulatory Visit: Payer: Medicare HMO | Admitting: Physician Assistant

## 2019-02-01 ENCOUNTER — Other Ambulatory Visit: Payer: Self-pay | Admitting: Hematology and Oncology

## 2019-02-01 DIAGNOSIS — E7849 Other hyperlipidemia: Secondary | ICD-10-CM | POA: Diagnosis not present

## 2019-02-01 DIAGNOSIS — E1169 Type 2 diabetes mellitus with other specified complication: Secondary | ICD-10-CM | POA: Diagnosis not present

## 2019-02-01 DIAGNOSIS — I10 Essential (primary) hypertension: Secondary | ICD-10-CM | POA: Diagnosis not present

## 2019-02-01 DIAGNOSIS — E1165 Type 2 diabetes mellitus with hyperglycemia: Secondary | ICD-10-CM | POA: Diagnosis not present

## 2019-02-01 DIAGNOSIS — M859 Disorder of bone density and structure, unspecified: Secondary | ICD-10-CM | POA: Diagnosis not present

## 2019-02-09 DIAGNOSIS — D0512 Intraductal carcinoma in situ of left breast: Secondary | ICD-10-CM | POA: Diagnosis not present

## 2019-02-09 DIAGNOSIS — I4892 Unspecified atrial flutter: Secondary | ICD-10-CM | POA: Diagnosis not present

## 2019-02-09 DIAGNOSIS — R197 Diarrhea, unspecified: Secondary | ICD-10-CM | POA: Diagnosis not present

## 2019-02-09 DIAGNOSIS — E11319 Type 2 diabetes mellitus with unspecified diabetic retinopathy without macular edema: Secondary | ICD-10-CM | POA: Diagnosis not present

## 2019-02-09 DIAGNOSIS — E1140 Type 2 diabetes mellitus with diabetic neuropathy, unspecified: Secondary | ICD-10-CM | POA: Diagnosis not present

## 2019-02-09 DIAGNOSIS — Z1331 Encounter for screening for depression: Secondary | ICD-10-CM | POA: Diagnosis not present

## 2019-02-09 DIAGNOSIS — Z0000 Encounter for general adult medical examination without abnormal findings: Secondary | ICD-10-CM | POA: Diagnosis not present

## 2019-02-09 DIAGNOSIS — G629 Polyneuropathy, unspecified: Secondary | ICD-10-CM | POA: Diagnosis not present

## 2019-02-09 DIAGNOSIS — E1169 Type 2 diabetes mellitus with other specified complication: Secondary | ICD-10-CM | POA: Diagnosis not present

## 2019-02-09 DIAGNOSIS — E785 Hyperlipidemia, unspecified: Secondary | ICD-10-CM | POA: Diagnosis not present

## 2019-02-09 DIAGNOSIS — M8580 Other specified disorders of bone density and structure, unspecified site: Secondary | ICD-10-CM | POA: Diagnosis not present

## 2019-02-13 DIAGNOSIS — R69 Illness, unspecified: Secondary | ICD-10-CM | POA: Diagnosis not present

## 2019-02-19 ENCOUNTER — Encounter: Payer: Self-pay | Admitting: General Surgery

## 2019-02-19 ENCOUNTER — Ambulatory Visit (INDEPENDENT_AMBULATORY_CARE_PROVIDER_SITE_OTHER): Payer: Medicare HMO | Admitting: Gastroenterology

## 2019-02-19 ENCOUNTER — Other Ambulatory Visit: Payer: Self-pay

## 2019-02-19 VITALS — Ht 64.0 in | Wt 142.0 lb

## 2019-02-19 DIAGNOSIS — R197 Diarrhea, unspecified: Secondary | ICD-10-CM | POA: Diagnosis not present

## 2019-02-19 NOTE — Addendum Note (Signed)
Addended by: Wyline Beady on: 02/19/2019 04:49 PM   Modules accepted: Orders

## 2019-02-19 NOTE — Patient Instructions (Addendum)
I am recommending some labs and stool studies to evaluate your diarrhea.  I am also recommending an upper endoscopy with biopsies of your small intestine and a colonoscopy with biopsies of your colon to evaluate for causes of diarrhea.  Office will contact you to arrange for these studies.  Meantime, continue to use Imodium to control your diarrhea.   I recommend using Metamucil every day between now and then.  Thank you for your patience with me and our technology today! Please stay home, safe, and healthy. I look forward to meeting you in person in the future.

## 2019-02-19 NOTE — Progress Notes (Signed)
TELEHEALTH VISIT  Referring Provider: Shon Baton, MD Primary Care Physician:  Shon Baton, MD   Tele-visit due to COVID-19 pandemic Patient requested visit virtually, consented to the virtual encounter via audio enabled telemedicine application (Doximity) Contact made at: 15:35 02/19/19 Patient verified by name and date of birth Location of patient: Home Location provider: [facility] Names of persons participating: Me, patient, Tinnie Gens CMA Time spent on telehealth visit:  I discussed the limitations of evaluation and management by telemedicine. The patient expressed understanding and agreed to proceed.  Reason for Consultation: Diarrhea, possible colitis   IMPRESSION:  Diarrhea x months    - some response to Imodium Bleeding due to internal hemorrhoids 2007 Atrial fibrillation on Eliquis PONV documented in her chart    - she does not remember the details No known family history of colon cancer or polyps   The differential diagnosis of chronic diarrhea without alarm features includes: irritable bowel syndrome, IBD, celiac disease, missed infection (such as giardia), food intolerance,microscopic colitis, thyroid disorder, other functional GI disease. By history, this is less likely to be obstruction.   I consented the patient at the bedside today discussing the risks, benefits, and alternatives to endoscopic evaluation. In particular, we discussed the risks that include, but are not limited to, reaction to medication, cardiopulmonary compromise, bleeding requiring blood transfusion, aspiration resulting in pneumonia, perforation requiring surgery, and even death. We reviewed the risk of missed lesion including polyps or even cancer. The patient acknowledges these risks and asks that we proceed.   PLAN: - Fecal calprotectin, ESR, and CRP to screen for IBD - GI pathogen panel, Giardia testing - TSH - Daily Metamucil recommended for stool bulking - EGD with  biopsies, colonoscopy with random biopsies  I consented the patient discussing the risks, benefits, and alternatives to endoscopic evaluation. In particular, we discussed the risks that include, but are not limited to, reaction to medication, cardiopulmonary compromise, bleeding requiring blood transfusion, aspiration resulting in pneumonia, perforation requiring surgery, lack of diagnosis, severe illness requiring hospitalization, and even death. We reviewed the risk of missed lesion including polyps or even cancer. The patient acknowledges these risks and asks that we proceed.   HPI: Dorothy Herring is a 78 y.o. female referred by Dr. Virgina Jock for diarrhea. The history is obtained through the patient and review of her electronic health record.  Seen by NP Chester Holstein and Dr. Ardis Hughs in 2013 for painless rectal bleeding attributed to hemorrhoids and treated with Anusol- HC suppositories.  She has a history of TIAs, breast cancer, atrial fibrillation on Eliquis.  She had PONV documented in her chart, but, she does not remember the details.   Recent diarrhea developed 6-8 months. Explosive, watery bowel movement with associated urgency with 3-6 BM daily. Baseline is one, formed BM daily. No associated abdominal pain or bloating. No blood or mucous in the stool.  No identified triggers. Good appetite. Weight stable. Coffee each morning. Using Imodium with successful.   Denies a precipitating event, trauma, close contacts with similar symptoms, changes in diet, recent travel or antibiotic use.   Colonoscopy 09/29/2005 with Dr. Deatra Ina for hematochezia revealed internal hemorrhoids. No endoscopy since that time.   She doesn't feel that she can tolerate a bowel prep due to gagging. She is requesting pills.   No known family history of colon cancer or polyps. No family history of uterine/endometrial cancer, pancreatic cancer or gastric/stomach cancer.  Past Medical History:  Diagnosis Date  . Cancer ([place])   .  Diabetes mellitus   . History of radiation therapy 05/02/18- 05/24/18   Left Breast 42.56 Gy in 16 fractions.   . Hyperlipidemia   . Persistent atrial fibrillation   . PONV (postoperative nausea and vomiting)   . TIA (transient ischemic attack)    seven years ago    Past Surgical History:  Procedure Laterality Date  . ABDOMINAL HYSTERECTOMY  age 36  . BILATERAL SALPINGOOPHORECTOMY     By patient history  . BREAST BIOPSY    . BREAST LUMPECTOMY WITH RADIOACTIVE SEED LOCALIZATION Left 02/27/2018   Procedure: BREAST LUMPECTOMY WITH RADIOACTIVE SEED LOCALIZATION;  Surgeon: Alphonsa Overall, MD;  Location: [facility];  Service: General;  Laterality: Left;  . CARDIOVERSION N/A 05/31/2018   Procedure: CARDIOVERSION;  Surgeon: [name], MD;  Location: [facility] ENDOSCOPY;  Service: Cardiovascular;  Laterality: N/A;    Current Outpatient Medications  Medication Sig Dispense Refill  . anastrozole (ARIMIDEX) 1 MG tablet TAKE 1 TABLET BY MOUTH EVERY DAY 90 tablet 0  . apixaban (ELIQUIS) 5 MG TABS tablet Take 1 tablet (5 mg total) by mouth 2 (two) times daily. 60 tablet 3  . empagliflozin (JARDIANCE) 10 MG TABS tablet Take 10 mg by mouth daily.    Marland Kitchen gabapentin (NEURONTIN) 300 MG capsule Take 300 mg by mouth 2 (two) times daily.    . insulin glargine (LANTUS) 100 UNIT/ML injection Inject 24 Units into the skin at bedtime.    Marland Kitchen LUMIGAN 0.01 % SOLN Place 1 drop into both eyes at bedtime.     . rosuvastatin (CRESTOR) 40 MG tablet Take 40 mg by mouth daily.  0  . FIBER PO Take 2 tablets by mouth daily.    . hyaluronate sodium (RADIAPLEXRX) GEL Apply 1 application topically 2 (two) times daily.    Marland Kitchen nystatin-triamcinolone ointment (MYCOLOG) Apply 1 application topically 2 (two) times daily. (Patient not taking: Reported on 02/19/2019) 30 g 0  . terconazole (TERAZOL 7) 0.4 % vaginal cream Place 1 applicator vaginally at bedtime. For 7 nights 45 g 0   No current facility-administered medications  for this visit.     Allergies as of 02/19/2019  . (No Known Allergies)    Family History  Problem Relation Age of Onset  . Lung cancer Father   . Alzheimer's disease Mother   . Colon cancer Neg Hx     Social History   Socioeconomic History  . Marital status: Widowed    Spouse name: Not on file  . Number of children: 1  . Years of education: Not on file  . Highest education level: Not on file  Occupational History  . Occupation: Housewife  Social Needs  . Financial resource strain: Not on file  . Food insecurity:    Worry: Not on file    Inability: Not on file  . Transportation needs:    Medical: No    Non-medical: No  Tobacco Use  . Smoking status: Never Smoker  . Smokeless tobacco: Never Used  Substance and Sexual Activity  . Alcohol use: No    Comment: occasional  . Drug use: No  . Sexual activity: Not Currently    Comment: widow,   Lifestyle  . Physical activity:    Days per week: Not on file    Minutes per session: Not on file  . Stress: Not on file  Relationships  . Social connections:    Talks on phone: Not on file    Gets together: Not on file  Attends religious service: Not on file    Active member of club or organization: Not on file    Attends meetings of clubs or organizations: Not on file    Relationship status: Not on file  . Intimate partner violence:    Fear of current or ex partner: No    Emotionally abused: No    Physically abused: No    Forced sexual activity: No  Other Topics Concern  . Not on file  Social History Narrative  . Not on file    Review of Systems: ALL ROS discussed and all others negative except listed in HPI.  Physical Exam: General: in no acute distress Neuro: Alert and appropriate Psych: Normal affect and normal insight   Howard Bunte L. Tarri Glenn, MD, MPH [facility] 02/19/2019, 3:32 PM

## 2019-02-20 ENCOUNTER — Telehealth: Payer: Self-pay | Admitting: Emergency Medicine

## 2019-02-20 NOTE — Telephone Encounter (Signed)
Noted! Thank you

## 2019-02-20 NOTE — Telephone Encounter (Signed)
Spoke with patient in attempt to schedule her for endo/colon as per Dr. Tarri Glenn recommendations. She states she does not what an endoscopy is and after explaining it she states she's not sure if she wants to have that done. After reassuring her and trying to schedule her appointment she states she does not want an afternoon appointment and her July is very busy. She request to be put on a wait list for a morning appointment in June.

## 2019-02-28 ENCOUNTER — Telehealth: Payer: Self-pay | Admitting: Emergency Medicine

## 2019-02-28 NOTE — Telephone Encounter (Signed)
   Primary Cardiologist: Dr. Rayann Heman  Chart reviewed as part of pre-operative protocol coverage. Given past medical history and time since last visit, based on ACC/AHA guidelines, Dorothy Herring would be at acceptable risk for the planned procedure without further cardiovascular testing.   Due to patients Hx of TIA she is high risk off anticoagulation. Due to low bleed risk of procedure, would recommend patient hold anticoagulation 1 day prior to procedure.  I will route this recommendation to the requesting party via Epic fax function and remove from pre-op pool.  Please call with questions.  Lyda Jester, PA-C 02/28/2019, 2:57 PM

## 2019-02-28 NOTE — Telephone Encounter (Signed)
Patient with diagnosis of afib on Eliquis for anticoagulation.    Procedure: colonoscopy Date of procedure: 03/21/2019  CHADS2-VASc score of  6 (CHF, HTN, AGE, DM2, stroke/tia x 2, CAD, AGE, female)  CrCl 44 ml/min  Due to patients Hx of TIA she is high risk off anticoagulation. Due to low bleed risk of procedure, would recommend patient hold anticoagulation 1 day prior to procedure.

## 2019-02-28 NOTE — Telephone Encounter (Signed)
[facility] Pre-operative Risk Assessment     Request for surgical clearance:     Endoscopy Procedure  What type of surgery is being performed?     colonoscopy  When is this surgery scheduled?     03-21-2019  What type of clearance is required ?   Pharmacy  Are there any medications that need to be held prior to surgery and how long? Eliquis 2 days  Practice name and name of physician performing surgery?      [facility]  What is your office phone and fax number?      Phone- [phone]  [phone]  Anesthesia type (None, local, MAC, general) ?       MAC

## 2019-03-02 NOTE — Telephone Encounter (Signed)
Patient informed to hold Eliquis one day before procedure.

## 2019-03-03 DIAGNOSIS — S61214A Laceration without foreign body of right ring finger without damage to nail, initial encounter: Secondary | ICD-10-CM | POA: Diagnosis not present

## 2019-03-03 DIAGNOSIS — W458XXA Other foreign body or object entering through skin, initial encounter: Secondary | ICD-10-CM | POA: Diagnosis not present

## 2019-03-03 DIAGNOSIS — Z794 Long term (current) use of insulin: Secondary | ICD-10-CM | POA: Diagnosis not present

## 2019-03-03 DIAGNOSIS — Z8679 Personal history of other diseases of the circulatory system: Secondary | ICD-10-CM | POA: Diagnosis not present

## 2019-03-03 DIAGNOSIS — Z853 Personal history of malignant neoplasm of breast: Secondary | ICD-10-CM | POA: Diagnosis not present

## 2019-03-03 DIAGNOSIS — E119 Type 2 diabetes mellitus without complications: Secondary | ICD-10-CM | POA: Diagnosis not present

## 2019-03-03 DIAGNOSIS — Z2821 Immunization not carried out because of patient refusal: Secondary | ICD-10-CM | POA: Diagnosis not present

## 2019-03-03 DIAGNOSIS — M79644 Pain in right finger(s): Secondary | ICD-10-CM | POA: Diagnosis not present

## 2019-03-03 DIAGNOSIS — Z7901 Long term (current) use of anticoagulants: Secondary | ICD-10-CM | POA: Diagnosis not present

## 2019-03-03 DIAGNOSIS — Z79899 Other long term (current) drug therapy: Secondary | ICD-10-CM | POA: Diagnosis not present

## 2019-03-07 DIAGNOSIS — R69 Illness, unspecified: Secondary | ICD-10-CM | POA: Diagnosis not present

## 2019-03-09 ENCOUNTER — Other Ambulatory Visit: Payer: Self-pay

## 2019-03-09 ENCOUNTER — Ambulatory Visit: Payer: Medicare HMO | Admitting: *Deleted

## 2019-03-09 VITALS — Ht 64.0 in | Wt 142.0 lb

## 2019-03-09 DIAGNOSIS — R197 Diarrhea, unspecified: Secondary | ICD-10-CM

## 2019-03-09 NOTE — Progress Notes (Signed)
No egg or soy allergy known to patient   issues with past sedation with any surgeries  or procedures- PONV , no intubation problems  No diet pills per patient No home 02 use per patient  No blood thinners per patient  Pt denies issues with constipation  No A fib or A flutter  EMMI video sent to pt's e mail   Pt verified name, DOB, address and insurance during PV today. Pt mailed instruction packet to included paper to complete and mail back to [facility] with addressed and stamped envelope, Emmi video, copy of consent form to read and not return, and instruction. PV completed over the phone. Pt encouraged to call with questions or issues    Pt is aware that care partner will wait in the car during parking lot; if they feel like they will be too hot to wait in the car; they may wait in the lobby.  We want them to wear a mask (we do not have any that we can provide them), practice social distancing, and we will check their temperatures when they get here.  I did remind patient that their care partner needs to stay in the parking lot the entire time. Pt will wear mask into building

## 2019-03-13 DIAGNOSIS — R921 Mammographic calcification found on diagnostic imaging of breast: Secondary | ICD-10-CM | POA: Diagnosis not present

## 2019-03-13 DIAGNOSIS — Z86000 Personal history of in-situ neoplasm of breast: Secondary | ICD-10-CM | POA: Diagnosis not present

## 2019-03-19 DIAGNOSIS — R921 Mammographic calcification found on diagnostic imaging of breast: Secondary | ICD-10-CM | POA: Diagnosis not present

## 2019-03-19 DIAGNOSIS — Z0389 Encounter for observation for other suspected diseases and conditions ruled out: Secondary | ICD-10-CM | POA: Diagnosis not present

## 2019-03-20 ENCOUNTER — Telehealth: Payer: Self-pay | Admitting: Gastroenterology

## 2019-03-20 NOTE — Telephone Encounter (Signed)
Patient return call answered no to all questions

## 2019-03-20 NOTE — Telephone Encounter (Signed)

## 2019-03-21 ENCOUNTER — Other Ambulatory Visit: Payer: Self-pay

## 2019-03-21 ENCOUNTER — Ambulatory Visit (AMBULATORY_SURGERY_CENTER): Payer: Medicare HMO | Admitting: Gastroenterology

## 2019-03-21 ENCOUNTER — Encounter: Payer: Self-pay | Admitting: Gastroenterology

## 2019-03-21 ENCOUNTER — Encounter

## 2019-03-21 VITALS — BP 166/90 | HR 16 | Temp 98.2°F | Resp 16 | Ht 64.0 in | Wt 142.0 lb

## 2019-03-21 DIAGNOSIS — K449 Diaphragmatic hernia without obstruction or gangrene: Secondary | ICD-10-CM

## 2019-03-21 DIAGNOSIS — D125 Benign neoplasm of sigmoid colon: Secondary | ICD-10-CM | POA: Diagnosis not present

## 2019-03-21 DIAGNOSIS — K2990 Gastroduodenitis, unspecified, without bleeding: Secondary | ICD-10-CM

## 2019-03-21 DIAGNOSIS — K317 Polyp of stomach and duodenum: Secondary | ICD-10-CM

## 2019-03-21 DIAGNOSIS — D124 Benign neoplasm of descending colon: Secondary | ICD-10-CM

## 2019-03-21 DIAGNOSIS — D122 Benign neoplasm of ascending colon: Secondary | ICD-10-CM

## 2019-03-21 DIAGNOSIS — R197 Diarrhea, unspecified: Secondary | ICD-10-CM

## 2019-03-21 DIAGNOSIS — K2980 Duodenitis without bleeding: Secondary | ICD-10-CM

## 2019-03-21 MED ORDER — PANTOPRAZOLE SODIUM 40 MG PO TBEC: DELAYED_RELEASE_TABLET | ORAL | 3 refills | Status: DC

## 2019-03-21 MED ORDER — SODIUM CHLORIDE 0.9 % IV SOLN: 500.0000 mL | Freq: Once | INTRAVENOUS | Status: DC

## 2019-03-21 NOTE — Op Note (Signed)
[place] Patient Name: Dorothy Herring Procedure Date: 03/21/2019 1:07 PM MRN: [id number] Endoscopist: Thornton Park MD, MD Age: 78 Referring MD:  Date of Birth: [birth year] Gender: Female Account #: [id number] Procedure:                Upper GI endoscopy Indications:              Diarrhea                           Diarrhea x months                           - some response to Imodium Medicines:                See the Anesthesia note for documentation of the                            administered medications Procedure:                Pre-Anesthesia Assessment:                           - Prior to the procedure, a History and Physical                            was performed, and patient medications and                            allergies were reviewed. The patient's tolerance of                            previous anesthesia was also reviewed. The risks                            and benefits of the procedure and the sedation                            options and risks were discussed with the patient.                            All questions were answered, and informed consent                            was obtained. Prior Anticoagulants: The patient has                            taken Eliquis (apixaban), last dose was 2 days                            prior to procedure. ASA Grade Assessment: III - A                            patient with severe systemic disease. After  reviewing the risks and benefits, the patient was                            deemed in satisfactory condition to undergo the                            procedure.                           After obtaining informed consent, the endoscope was                            passed under direct vision. Throughout the                            procedure, the patient's blood pressure, pulse, and                            oxygen saturations were monitored continuously. The                   Endoscope was introduced through the mouth, and                            advanced to the third part of duodenum. The upper                            GI endoscopy was accomplished without difficulty.                            The patient tolerated the procedure well. Scope In: Scope Out: Findings:                 The esophagus was normal.                           Diffuse mild inflammation characterized by                            erythema, friability and granularity was found in                            the gastric body and in the gastric antrum.                            Biopsies were taken with a cold forceps for                            histology. Estimated blood loss was minimal.                           Diffuse moderately erythematous mucosa without                            active bleeding and with no stigmata of bleeding  was found in the duodenal bulb. Biopsies were taken                            with a cold forceps for histology.                           A single 2 mm sessile polyp with no bleeding was                            found in the second portion of the duodenum. The                            polyp was removed with a piecemeal technique using                            a cold biopsy forceps. Resection and retrieval were                            complete. Estimated blood loss was minimal.                           A small hiatal hernia was present.                           The cardia and gastric fundus were normal on                            retroflexion.                           The exam was otherwise without abnormality. Complications:            No immediate complications. Estimated blood loss:                            Minimal. Estimated Blood Loss:     Estimated blood loss was minimal. Impression:               - Normal esophagus.                           - Gastritis. Biopsied.                            - Erythematous duodenopathy. Biopsied.                           - A single duodenal polyp. Resected and retrieved.                           - Small hiatal hernia.                           - The examination was otherwise normal. Recommendation:           - Patient has a contact number available for  emergencies. The signs and symptoms of potential                            delayed complications were discussed with the                            patient. Return to normal activities tomorrow.                            Written discharge instructions were provided to the                            patient.                           - Resume regular diet.                           - Continue present medications.                           - Avoid all NSAIDs.                           - Start pantoprazole 40 mg daily x 8 weeks.                           - Resume Eliquis (apixaban) at prior dose tomorrow.                            Refer to managing physician for further adjustment                            of therapy.                           - Await pathology results. Thornton Park MD, MD 03/21/2019 1:52:39 PM This report has been signed electronically.

## 2019-03-21 NOTE — Progress Notes (Signed)
PT taken to PACU. Monitors in place. VSS. Report given to RN. 

## 2019-03-21 NOTE — Progress Notes (Signed)
Called to room to assist during endoscopic procedure.  Patient ID and intended procedure confirmed with present staff. Received instructions for my participation in the procedure from the performing physician.  

## 2019-03-21 NOTE — Op Note (Signed)
[place] Patient Name: Dorothy Herring Procedure Date: 03/21/2019 12:30 PM MRN: [id number] Endoscopist: Thornton Park MD, MD Age: 78 Referring MD:  Date of Birth: 1941-03-20 Gender: Female Account #: [id number] Procedure:                Colonoscopy Indications:              Clinically significant diarrhea of unexplained                            origin                           Diarrhea x months                           - some response to Imodium                           Bleeding due to internal hemorrhoids 2007                           Atrial fibrillation on Eliquis                           PONV documented in her chart                           - she does not remember the details                           No known family history of colon cancer or polyps Medicines:                See the Anesthesia note for documentation of the                            administered medications Procedure:                Pre-Anesthesia Assessment:                           - Prior to the procedure, a History and Physical                            was performed, and patient medications and                            allergies were reviewed. The patient's tolerance of                            previous anesthesia was also reviewed. The risks                            and benefits of the procedure and the sedation                            options and risks were discussed with the patient.  All questions were answered, and informed consent                            was obtained. Prior Anticoagulants: The patient has                            taken Eliquis (apixaban), last dose was 2 days                            prior to procedure. ASA Grade Assessment: III - A                            patient with severe systemic disease. After                            reviewing the risks and benefits, the patient was                            deemed in  satisfactory condition to undergo the                            procedure.                           After obtaining informed consent, the colonoscope                            was passed under direct vision. Throughout the                            procedure, the patient's blood pressure, pulse, and                            oxygen saturations were monitored continuously. The                            Colonoscope was introduced through the anus and                            advanced to the the terminal ileum, with                            identification of the appendiceal orifice and IC                            valve. The colonoscopy was performed without                            difficulty. The patient tolerated the procedure                            well. The quality of the bowel preparation was  good. The terminal ileum, ileocecal valve,                            appendiceal orifice, and rectum were photographed. Scope In: 1:20:33 PM Scope Out: 1:43:41 PM Scope Withdrawal Time: 0 hours 20 minutes 9 seconds  Total Procedure Duration: 0 hours 23 minutes 8 seconds  Findings:                 Non-bleeding external and internal hemorrhoids were                            found.                           A 5 mm polyp was found in the ascending colon. The                            polyp was sessile. The polyp was removed with a                            cold snare. Resection and retrieval were complete.                            Estimated blood loss was minimal.                           Five sessile polyps were found in the sigmoid                            colon, descending colon and ascending colon. The                            polyps were 1 to 4 mm in size. These polyps were                            removed with a cold snare. Resection and retrieval                            were complete. Estimated blood loss was minimal.                            The colon (entire examined portion) appeared                            normal. Biopsies for histology were taken with a                            cold forceps from the entire colon for evaluation                            of microscopic colitis. Estimated blood loss was                            minimal.  The terminal ileum appeared normal. Biopsies were                            taken with a cold forceps for histology. Estimated                            blood loss was minimal.                           The exam was otherwise without abnormality on                            direct and retroflexion views. Complications:            No immediate complications. Estimated blood loss:                            Minimal. Estimated Blood Loss:     Estimated blood loss was minimal. Impression:               - Non-bleeding external and internal hemorrhoids.                           - One 5 mm polyp in the ascending colon, removed                            with a cold snare. Resected and retrieved.                           - Five 1 to 4 mm polyps in the sigmoid colon, in                            the descending colon and in the ascending colon,                            removed with a cold snare. Resected and retrieved.                           - The entire examined colon is normal. Biopsied.                           - The examined portion of the ileum was normal.                            Biopsied.                           - The examination was otherwise normal on direct                            and retroflexion views. Recommendation:           - Patient has a contact number available for  emergencies. The signs and symptoms of potential                            delayed complications were discussed with the                            patient. Return to normal activities tomorrow.                            Written  discharge instructions were provided to the                            patient.                           - Resume regular diet today.                           - Continue present medications.                           - Resume Eliquis (apixaban) at prior dose tomorrow.                            Refer to managing physician for further adjustment                            of therapy.                           - Await pathology results.                           - Virtual encounter with me to review these results                            and further evaluation/treat her diarrhea. Thornton Park MD, MD 03/21/2019 1:59:10 PM This report has been signed electronically.

## 2019-03-21 NOTE — Progress Notes (Signed)
Pt does complains of left eye "burning" and states she cannot keep eye open d/t discomfort.  Right eye isn't bothering her.  Left eye is slightly reddened.  Pt requests warm compress and warm pack given to pt.  She states when her eye is open, she isn't having any trouble with her vision.  Kassie Mends flushing pt's eye with normal saline- states relief from discomfort after flush Dr. Tarri Glenn at bedside and examining eye- ok to discharge. When I spoke to her daughter to give her a report, she states her mother has dry eyes and was complaining of them being dry last night- she frequently uses Visine

## 2019-03-21 NOTE — Patient Instructions (Signed)
YOU HAD AN ENDOSCOPIC PROCEDURE TODAY AT THE [facility]:   Refer to the procedure report that was given to you for any specific questions about what was found during the examination.  If the procedure report does not answer your questions, please call your gastroenterologist to clarify.  If you requested that your care partner not be given the details of your procedure findings, then the procedure report has been included in a sealed envelope for you to review at your convenience later.  YOU SHOULD EXPECT: Some feelings of bloating in the abdomen. Passage of more gas than usual.  Walking can help get rid of the air that was put into your GI tract during the procedure and reduce the bloating. If you had a lower endoscopy (such as a colonoscopy or flexible sigmoidoscopy) you may notice spotting of blood in your stool or on the toilet paper. If you underwent a bowel prep for your procedure, you may not have a normal bowel movement for a few days.  Please Note:  You might notice some irritation and congestion in your nose or some drainage.  This is from the oxygen used during your procedure.  There is no need for concern and it should clear up in a day or so.  SYMPTOMS TO REPORT IMMEDIATELY:   Following lower endoscopy (colonoscopy or flexible sigmoidoscopy):  Excessive amounts of blood in the stool  Significant tenderness or worsening of abdominal pains  Swelling of the abdomen that is new, acute  Fever of 100F or higher   Following upper endoscopy (EGD)  Vomiting of blood or coffee ground material  New chest pain or pain under the shoulder blades  Painful or persistently difficult swallowing  New shortness of breath  Fever of 100F or higher  Black, tarry-looking stools  For urgent or emergent issues, a gastroenterologist can be reached at any hour by calling [phone].   DIET:  We do recommend a small meal at first, but then you may proceed to your regular diet.  Drink  plenty of fluids but you should avoid alcoholic beverages for 24 hours.  ACTIVITY:  You should plan to take it easy for the rest of today and you should NOT DRIVE or use heavy machinery until tomorrow (because of the sedation medicines used during the test).    FOLLOW UP: Our staff will call the number listed on your records 48-72 hours following your procedure to check on you and address any questions or concerns that you may have regarding the information given to you following your procedure. If we do not reach you, we will leave a message.  We will attempt to reach you two times.  During this call, we will ask if you have developed any symptoms of COVID 19. If you develop any symptoms (ie: fever, flu-like symptoms, shortness of breath, cough etc.) before then, please call [phone].  If you test positive for Covid 19 in the 2 weeks post procedure, please call and report this information to us.    If any biopsies were taken you will be contacted by phone or by letter within the next 1-3 weeks.  Please call us at [phone] if you have not heard about the biopsies in 3 weeks.    SIGNATURES/CONFIDENTIALITY: You and/or your care partner have signed paperwork which will be entered into your electronic medical record.  These signatures attest to the fact that that the information above on your After Visit Summary has been reviewed and is   understood.  Full responsibility of the confidentiality of this discharge information lies with you and/or your care-partner.  Await pathology  Resume Eliquis tomorrow  Avoid all NSAIDS (Ibuprofen, Advil, Aleve, Motrin)  Start Pantoprazole 40 mg daily for 8 weeks- take 30 minutes before first meal of the day  Dr. Tarri Glenn' nurse will call you to set up a telemedicine visit to discuss treatment  Please read over handouts about polyps, hemorrhoids, gastritis and hiatal hernias

## 2019-03-21 NOTE — Progress Notes (Signed)
Pt's states no medical or surgical changes since previsit or office visit. 

## 2019-03-26 ENCOUNTER — Telehealth: Payer: Self-pay | Admitting: *Deleted

## 2019-03-26 ENCOUNTER — Telehealth: Payer: Self-pay

## 2019-03-26 NOTE — Telephone Encounter (Signed)
  Follow up Call-  Call back number 03/21/2019  Post procedure Call Back phone  # [phone]  Permission to leave phone message Yes  Some recent data might be hidden     Patient questions:  Do you have a fever, pain , or abdominal swelling? No. Pain Score  0 *  Have you tolerated food without any problems? Yes.    Have you been able to return to your normal activities? Yes.    Do you have any questions about your discharge instructions: Diet   No. Medications  No. Follow up visit  No.  Do you have questions or concerns about your Care? No.  Actions: * If pain score is 4 or above: No action needed, pain <4.  1. Have you developed a fever since your procedure? no  2.   Have you had an respiratory symptoms (SOB or cough) since your procedure? no  3.   Have you tested positive for COVID 19 since your procedure no  4.   Have you had any family members/close contacts diagnosed with the COVID 19 since your procedure?  no   If yes to any of these questions please route to Joylene John, RN and Alphonsa Gin, Therapist, sports.

## 2019-03-26 NOTE — Telephone Encounter (Signed)
Called [phone] and left a messaged we tried to reach pt for a follow up call. maw

## 2019-03-28 ENCOUNTER — Encounter: Payer: Self-pay | Admitting: Gastroenterology

## 2019-04-23 ENCOUNTER — Ambulatory Visit: Payer: Medicare HMO | Admitting: Gastroenterology

## 2019-05-07 ENCOUNTER — Other Ambulatory Visit: Payer: Self-pay | Admitting: Hematology and Oncology

## 2019-05-07 DIAGNOSIS — H401131 Primary open-angle glaucoma, bilateral, mild stage: Secondary | ICD-10-CM | POA: Diagnosis not present

## 2019-05-24 DIAGNOSIS — L820 Inflamed seborrheic keratosis: Secondary | ICD-10-CM | POA: Diagnosis not present

## 2019-05-24 DIAGNOSIS — Z85828 Personal history of other malignant neoplasm of skin: Secondary | ICD-10-CM | POA: Diagnosis not present

## 2019-05-24 DIAGNOSIS — L821 Other seborrheic keratosis: Secondary | ICD-10-CM | POA: Diagnosis not present

## 2019-05-24 DIAGNOSIS — L814 Other melanin hyperpigmentation: Secondary | ICD-10-CM | POA: Diagnosis not present

## 2019-05-24 DIAGNOSIS — L570 Actinic keratosis: Secondary | ICD-10-CM | POA: Diagnosis not present

## 2019-05-30 DIAGNOSIS — H43811 Vitreous degeneration, right eye: Secondary | ICD-10-CM | POA: Diagnosis not present

## 2019-05-30 DIAGNOSIS — H3561 Retinal hemorrhage, right eye: Secondary | ICD-10-CM | POA: Diagnosis not present

## 2019-05-30 DIAGNOSIS — E113493 Type 2 diabetes mellitus with severe nonproliferative diabetic retinopathy without macular edema, bilateral: Secondary | ICD-10-CM | POA: Diagnosis not present

## 2019-05-30 DIAGNOSIS — H401131 Primary open-angle glaucoma, bilateral, mild stage: Secondary | ICD-10-CM | POA: Diagnosis not present

## 2019-05-31 DIAGNOSIS — Z23 Encounter for immunization: Secondary | ICD-10-CM | POA: Diagnosis not present

## 2019-06-05 ENCOUNTER — Other Ambulatory Visit: Payer: Self-pay | Admitting: Hematology and Oncology

## 2019-06-06 ENCOUNTER — Telehealth: Payer: Self-pay | Admitting: Hematology and Oncology

## 2019-06-06 DIAGNOSIS — M79641 Pain in right hand: Secondary | ICD-10-CM | POA: Diagnosis not present

## 2019-06-06 DIAGNOSIS — M545 Low back pain: Secondary | ICD-10-CM | POA: Diagnosis not present

## 2019-06-06 NOTE — Telephone Encounter (Signed)
Scheduled appt per 9/15 sch message - pt aware of appt date and time

## 2019-06-08 DIAGNOSIS — M79641 Pain in right hand: Secondary | ICD-10-CM | POA: Diagnosis not present

## 2019-06-08 DIAGNOSIS — M79642 Pain in left hand: Secondary | ICD-10-CM | POA: Diagnosis not present

## 2019-06-11 DIAGNOSIS — M1990 Unspecified osteoarthritis, unspecified site: Secondary | ICD-10-CM | POA: Diagnosis not present

## 2019-06-11 DIAGNOSIS — Z794 Long term (current) use of insulin: Secondary | ICD-10-CM | POA: Diagnosis not present

## 2019-06-11 DIAGNOSIS — Z9119 Patient's noncompliance with other medical treatment and regimen: Secondary | ICD-10-CM | POA: Diagnosis not present

## 2019-06-11 DIAGNOSIS — I4892 Unspecified atrial flutter: Secondary | ICD-10-CM | POA: Diagnosis not present

## 2019-06-11 DIAGNOSIS — D0512 Intraductal carcinoma in situ of left breast: Secondary | ICD-10-CM | POA: Diagnosis not present

## 2019-06-11 DIAGNOSIS — R197 Diarrhea, unspecified: Secondary | ICD-10-CM | POA: Diagnosis not present

## 2019-06-11 DIAGNOSIS — I10 Essential (primary) hypertension: Secondary | ICD-10-CM | POA: Diagnosis not present

## 2019-06-11 DIAGNOSIS — Z7901 Long term (current) use of anticoagulants: Secondary | ICD-10-CM | POA: Diagnosis not present

## 2019-06-11 DIAGNOSIS — E1165 Type 2 diabetes mellitus with hyperglycemia: Secondary | ICD-10-CM | POA: Diagnosis not present

## 2019-06-11 DIAGNOSIS — K635 Polyp of colon: Secondary | ICD-10-CM | POA: Diagnosis not present

## 2019-06-15 ENCOUNTER — Other Ambulatory Visit: Payer: Self-pay | Admitting: Gastroenterology

## 2019-06-15 DIAGNOSIS — K2990 Gastroduodenitis, unspecified, without bleeding: Secondary | ICD-10-CM

## 2019-06-19 DIAGNOSIS — R69 Illness, unspecified: Secondary | ICD-10-CM | POA: Diagnosis not present

## 2019-06-21 DIAGNOSIS — H401131 Primary open-angle glaucoma, bilateral, mild stage: Secondary | ICD-10-CM | POA: Diagnosis not present

## 2019-06-21 DIAGNOSIS — E113493 Type 2 diabetes mellitus with severe nonproliferative diabetic retinopathy without macular edema, bilateral: Secondary | ICD-10-CM | POA: Diagnosis not present

## 2019-06-21 DIAGNOSIS — H43811 Vitreous degeneration, right eye: Secondary | ICD-10-CM | POA: Diagnosis not present

## 2019-06-21 DIAGNOSIS — H3561 Retinal hemorrhage, right eye: Secondary | ICD-10-CM | POA: Diagnosis not present

## 2019-06-26 DIAGNOSIS — E113492 Type 2 diabetes mellitus with severe nonproliferative diabetic retinopathy without macular edema, left eye: Secondary | ICD-10-CM | POA: Diagnosis not present

## 2019-06-28 ENCOUNTER — Encounter: Payer: Self-pay | Admitting: Gynecology

## 2019-07-02 ENCOUNTER — Other Ambulatory Visit: Payer: Self-pay | Admitting: Hematology and Oncology

## 2019-07-02 DIAGNOSIS — L03113 Cellulitis of right upper limb: Secondary | ICD-10-CM | POA: Diagnosis not present

## 2019-07-05 ENCOUNTER — Other Ambulatory Visit: Payer: Self-pay | Admitting: Hematology and Oncology

## 2019-07-24 DIAGNOSIS — I4892 Unspecified atrial flutter: Secondary | ICD-10-CM | POA: Diagnosis not present

## 2019-07-24 DIAGNOSIS — S6991XA Unspecified injury of right wrist, hand and finger(s), initial encounter: Secondary | ICD-10-CM | POA: Diagnosis not present

## 2019-07-24 DIAGNOSIS — E1169 Type 2 diabetes mellitus with other specified complication: Secondary | ICD-10-CM | POA: Diagnosis not present

## 2019-07-24 DIAGNOSIS — Z9119 Patient's noncompliance with other medical treatment and regimen: Secondary | ICD-10-CM | POA: Diagnosis not present

## 2019-07-25 DIAGNOSIS — E113491 Type 2 diabetes mellitus with severe nonproliferative diabetic retinopathy without macular edema, right eye: Secondary | ICD-10-CM | POA: Diagnosis not present

## 2019-07-27 DIAGNOSIS — M65331 Trigger finger, right middle finger: Secondary | ICD-10-CM | POA: Diagnosis not present

## 2019-07-27 DIAGNOSIS — M65321 Trigger finger, right index finger: Secondary | ICD-10-CM | POA: Diagnosis not present

## 2019-07-27 DIAGNOSIS — M65312 Trigger thumb, left thumb: Secondary | ICD-10-CM | POA: Diagnosis not present

## 2019-08-27 ENCOUNTER — Inpatient Hospital Stay: Payer: Medicare HMO | Attending: Hematology and Oncology | Admitting: Hematology and Oncology

## 2019-08-27 NOTE — Assessment & Plan Note (Deleted)
02/27/2018:Left lumpectomy: DCIS with calcifications, intermediate grade, 0.6 cm, margins negative, ER 100%, PR 100%, Tis N0 stage 0  Treatment plan : 1.Adjuvant radiation therapy  05/02/2018-05/24/2018 2.followed by adjuvant antiestrogen therapy with Letrozole 1 mg dailydaily x5 years, started June 20, 2018.  Letrozole toxicities:  Breast cancer surveillance: 1.  Breast exam 06/27/2019: Benign 2.  Mammogram May 2020: [facility] 3.  DEXA scan 08/31/2018: T score -1.2: Mild osteopenia: Consider calcium and vitamin D  Return to clinic in 1 year for follow-up

## 2019-08-29 DIAGNOSIS — M65322 Trigger finger, left index finger: Secondary | ICD-10-CM | POA: Diagnosis not present

## 2019-08-29 DIAGNOSIS — M65312 Trigger thumb, left thumb: Secondary | ICD-10-CM | POA: Diagnosis not present

## 2019-08-29 DIAGNOSIS — M65332 Trigger finger, left middle finger: Secondary | ICD-10-CM | POA: Diagnosis not present

## 2019-08-29 DIAGNOSIS — M65331 Trigger finger, right middle finger: Secondary | ICD-10-CM | POA: Diagnosis not present

## 2019-08-29 DIAGNOSIS — M65321 Trigger finger, right index finger: Secondary | ICD-10-CM | POA: Diagnosis not present

## 2019-08-29 DIAGNOSIS — M65342 Trigger finger, left ring finger: Secondary | ICD-10-CM | POA: Diagnosis not present

## 2019-09-10 DIAGNOSIS — M65331 Trigger finger, right middle finger: Secondary | ICD-10-CM | POA: Diagnosis not present

## 2019-09-10 DIAGNOSIS — M65321 Trigger finger, right index finger: Secondary | ICD-10-CM | POA: Diagnosis not present

## 2019-09-10 DIAGNOSIS — M65322 Trigger finger, left index finger: Secondary | ICD-10-CM | POA: Diagnosis not present

## 2019-09-10 DIAGNOSIS — M65342 Trigger finger, left ring finger: Secondary | ICD-10-CM | POA: Diagnosis not present

## 2019-09-10 DIAGNOSIS — M65332 Trigger finger, left middle finger: Secondary | ICD-10-CM | POA: Diagnosis not present

## 2019-09-10 DIAGNOSIS — M65312 Trigger thumb, left thumb: Secondary | ICD-10-CM | POA: Diagnosis not present

## 2019-09-24 DIAGNOSIS — D485 Neoplasm of uncertain behavior of skin: Secondary | ICD-10-CM | POA: Diagnosis not present

## 2019-09-24 DIAGNOSIS — Z85828 Personal history of other malignant neoplasm of skin: Secondary | ICD-10-CM | POA: Diagnosis not present

## 2019-09-24 DIAGNOSIS — C44729 Squamous cell carcinoma of skin of left lower limb, including hip: Secondary | ICD-10-CM | POA: Diagnosis not present

## 2019-10-11 DIAGNOSIS — H01001 Unspecified blepharitis right upper eyelid: Secondary | ICD-10-CM | POA: Diagnosis not present

## 2019-10-11 DIAGNOSIS — H04123 Dry eye syndrome of bilateral lacrimal glands: Secondary | ICD-10-CM | POA: Diagnosis not present

## 2019-10-11 DIAGNOSIS — H01004 Unspecified blepharitis left upper eyelid: Secondary | ICD-10-CM | POA: Diagnosis not present

## 2019-10-12 DIAGNOSIS — E1169 Type 2 diabetes mellitus with other specified complication: Secondary | ICD-10-CM | POA: Diagnosis not present

## 2019-10-12 DIAGNOSIS — M6530 Trigger finger, unspecified finger: Secondary | ICD-10-CM | POA: Diagnosis not present

## 2019-10-12 DIAGNOSIS — Z9119 Patient's noncompliance with other medical treatment and regimen: Secondary | ICD-10-CM | POA: Diagnosis not present

## 2019-10-12 DIAGNOSIS — I4892 Unspecified atrial flutter: Secondary | ICD-10-CM | POA: Diagnosis not present

## 2019-10-12 DIAGNOSIS — K635 Polyp of colon: Secondary | ICD-10-CM | POA: Diagnosis not present

## 2019-10-12 DIAGNOSIS — E11319 Type 2 diabetes mellitus with unspecified diabetic retinopathy without macular edema: Secondary | ICD-10-CM | POA: Diagnosis not present

## 2019-10-12 DIAGNOSIS — D0512 Intraductal carcinoma in situ of left breast: Secondary | ICD-10-CM | POA: Diagnosis not present

## 2019-10-12 DIAGNOSIS — L0390 Cellulitis, unspecified: Secondary | ICD-10-CM | POA: Diagnosis not present

## 2019-10-12 DIAGNOSIS — Z7901 Long term (current) use of anticoagulants: Secondary | ICD-10-CM | POA: Diagnosis not present

## 2019-10-12 DIAGNOSIS — E1165 Type 2 diabetes mellitus with hyperglycemia: Secondary | ICD-10-CM | POA: Diagnosis not present

## 2019-10-16 ENCOUNTER — Telehealth: Payer: Self-pay | Admitting: Hematology and Oncology

## 2019-10-16 NOTE — Telephone Encounter (Signed)
I left a message regarding schedule  

## 2019-10-21 NOTE — Progress Notes (Signed)
Patient Care Team: Shon Baton, MD as PCP - General (Internal Medicine) Alphonsa Overall, MD as Consulting Physician (General Surgery) Nicholas Lose, MD as Consulting Physician (Hematology and Oncology) Eppie Gibson, MD as Attending Physician (Radiation Oncology) Gardenia Phlegm, NP as Nurse Practitioner (Hematology and Oncology)  DIAGNOSIS:    ICD-10-CM   1. Ductal carcinoma in situ (DCIS) of left breast  D05.12     SUMMARY OF ONCOLOGIC HISTORY: Oncology History  Ductal carcinoma in situ (DCIS) of left breast  02/01/2018 Initial Diagnosis   Left nipple discharge, mammogram showing asymmetry, ultrasound reveals 0.5 cm area of concern at 12 o'clock position 1 cm from the nipple, axilla negative, biopsy revealed intermediate grade DCIS ER 100%, PR 100%, Tis N0 stage 0 clinical stage   02/27/2018 Surgery   Left lumpectomy: DCIS with calcifications, intermediate grade, 0.6 cm, margins negative, ER 100%, PR 100%, Tis N0 stage 0    05/02/2018 - 05/24/2018 Radiation Therapy   Adjuvant radiation therapy   Left Breast / 42.56 Gy in 16 fractions   06/2018 -  Anti-estrogen oral therapy   Anastrozole daily     CHIEF COMPLIANT: Follow-up of left breast DCIS on anastrozole   INTERVAL HISTORY: Dorothy Herring is a 79 y.o. with above-mentioned history of left breast DCIS who underwent a lumpectomy, radiation and is currently on antiestrogen therapy with anastrozole. She presents to the clinic today for follow-up.  She continues to have hot flashes related to letrozole.  She denies any pain lumps or nodules in the breast.  She tells me that last year she had a mammogram which showed something suspicious but then when she went for a biopsy there was nothing done at that time.  She is concerned about that.  ALLERGIES:  has No Known Allergies.  MEDICATIONS:  Current Outpatient Medications  Medication Sig Dispense Refill   anastrozole (ARIMIDEX) 1 MG tablet TAKE 1 TABLET BY MOUTH EVERY DAY 90  tablet 0   apixaban (ELIQUIS) 5 MG TABS tablet Take 1 tablet (5 mg total) by mouth 2 (two) times daily. 60 tablet 3   empagliflozin (JARDIANCE) 10 MG TABS tablet Take 10 mg by mouth daily.     FIBER PO Take 2 tablets by mouth daily.     gabapentin (NEURONTIN) 300 MG capsule Take 300 mg by mouth daily.      hyaluronate sodium (RADIAPLEXRX) GEL Apply 1 application topically 2 (two) times daily.     insulin glargine (LANTUS) 100 UNIT/ML injection Inject 24 Units into the skin at bedtime.     LUMIGAN 0.01 % SOLN Place 1 drop into both eyes at bedtime.      nystatin-triamcinolone ointment (MYCOLOG) Apply 1 application topically 2 (two) times daily. (Patient not taking: Reported on 02/19/2019) 30 g 0   pantoprazole (PROTONIX) 40 MG tablet TAKE 1 TABLET BY MOUTH 30 MINS BEFORE FIRST MEAL OF THE DAY EVERY DAY 90 tablet 1   rosuvastatin (CRESTOR) 40 MG tablet Take 40 mg by mouth daily.  0   terconazole (TERAZOL 7) 0.4 % vaginal cream Place 1 applicator vaginally at bedtime. For 7 nights 45 g 0   Current Facility-Administered Medications  Medication Dose Route Frequency Provider Last Rate Last Admin   0.9 %  sodium chloride infusion  500 mL Intravenous Once Thornton Park, MD        PHYSICAL EXAMINATION: ECOG PERFORMANCE STATUS: 1 - Symptomatic but completely ambulatory  Vitals:   10/22/19 0823  BP: (!) 114/56  Pulse: 79  Resp: 18  Temp: 98 F (36.7 C)  SpO2: 100%   Filed Weights   10/22/19 0823  Weight: 143 lb 12.8 oz (65.2 kg)    BREAST: No palpable masses or nodules in either right or left breasts. No palpable axillary supraclavicular or infraclavicular adenopathy no breast tenderness or nipple discharge. (exam performed in the presence of a chaperone)  LABORATORY DATA:  I have reviewed the data as listed CMP Latest Ref Rng & Units 05/25/2018 03/31/2018 02/08/2018  Glucose 70 - 99 mg/dL 193(H) 374(H) 193(H)  BUN 8 - 23 mg/dL 18 19 21   Creatinine 0.44 - 1.00 mg/dL 0.99 0.99  0.90  Sodium 135 - 145 mmol/L 139 138 140  Potassium 3.5 - 5.1 mmol/L 4.2 4.4 4.0  Chloride 98 - 111 mmol/L 102 104 105  CO2 22 - 32 mmol/L 29 25 26   Calcium 8.9 - 10.3 mg/dL 10.0 9.6 9.5  Total Protein 6.4 - 8.3 g/dL - - 6.8  Total Bilirubin 0.2 - 1.2 mg/dL - - 0.5  Alkaline Phos 40 - 150 U/L - - 47  AST 5 - 34 U/L - - 22  ALT 0 - 55 U/L - - 24    Lab Results  Component Value Date   WBC 5.4 05/25/2018   HGB 14.0 05/25/2018   HCT 43.7 05/25/2018   MCV 87.9 05/25/2018   PLT 172 05/25/2018   NEUTROABS 4.3 02/08/2018    ASSESSMENT & PLAN:  Ductal carcinoma in situ (DCIS) of left breast 02/27/2018:Left lumpectomy: DCIS with calcifications, intermediate grade, 0.6 cm, margins negative, ER 100%, PR 100%, Tis N0 stage 0  Treatment plan :  1.Adjuvant radiation therapy  05/02/2018-05/24/18 2.followed by adjuvant antiestrogen therapy with Letrozole 1 mg dailydaily x5 years started June 20, 2018.  Letrozole toxicities: Continues to have hot flashes that are mild to moderate.  Breast cancer surveillance: 1.  Breast exam 10/22/2019: Benign 2.  Mammogram at [facility] 2020: Benign appearing calcifications were seen and a 30-month mammogram is recommended. We will order another mammogram in the next week or so.  Bone density 08/22/2018: T score -1.2: Mild osteopenia Return to clinic in 1 year for follow-up    No orders of the defined types were placed in this encounter.  The patient has a good understanding of the overall plan. she agrees with it. she will call with any problems that may develop before the next visit here.  Total time spent: 20 mins including face to face time and time spent for planning, charting and coordination of care  Nicholas Lose, MD 10/22/2019  I, Cloyde Reams Dorshimer, am acting as scribe for Dr. Nicholas Lose.  I have reviewed the above documentation for accuracy and completeness, and I agree with the above.

## 2019-10-22 ENCOUNTER — Other Ambulatory Visit: Payer: Self-pay

## 2019-10-22 ENCOUNTER — Inpatient Hospital Stay: Payer: Medicare HMO | Attending: Hematology and Oncology | Admitting: Hematology and Oncology

## 2019-10-22 DIAGNOSIS — Z79811 Long term (current) use of aromatase inhibitors: Secondary | ICD-10-CM | POA: Diagnosis not present

## 2019-10-22 DIAGNOSIS — D0512 Intraductal carcinoma in situ of left breast: Secondary | ICD-10-CM

## 2019-10-22 DIAGNOSIS — Z79899 Other long term (current) drug therapy: Secondary | ICD-10-CM | POA: Insufficient documentation

## 2019-10-22 DIAGNOSIS — Z923 Personal history of irradiation: Secondary | ICD-10-CM | POA: Diagnosis not present

## 2019-10-22 DIAGNOSIS — Z006 Encounter for examination for normal comparison and control in clinical research program: Secondary | ICD-10-CM | POA: Diagnosis not present

## 2019-10-22 DIAGNOSIS — Z170 Estrogen receptor positive status [ER+]: Secondary | ICD-10-CM | POA: Diagnosis not present

## 2019-10-22 DIAGNOSIS — Z23 Encounter for immunization: Secondary | ICD-10-CM | POA: Diagnosis not present

## 2019-10-22 DIAGNOSIS — R69 Illness, unspecified: Secondary | ICD-10-CM | POA: Diagnosis not present

## 2019-10-22 MED ORDER — ANASTROZOLE 1 MG PO TABS: 1.0000 mg | ORAL_TABLET | Freq: Every day | ORAL | 3 refills | Status: DC

## 2019-10-22 NOTE — Assessment & Plan Note (Signed)
02/27/2018:Left lumpectomy: DCIS with calcifications, intermediate grade, 0.6 cm, margins negative, ER 100%, PR 100%, Tis N0 stage 0  Treatment plan : 1.Adjuvant radiation therapy  05/02/2018-05/24/18 2.followed by adjuvant antiestrogen therapy with Letrozole 1 mg dailydaily x5 years started June 20, 2018.  Letrozole toxicities: Denies any major adverse effects.  Breast cancer surveillance: 1.  Breast exam 10/22/2019: Benign 2.  Mammogram at [facility] density 08/22/2018: T score -1.2: Mild osteopenia Return to clinic in 1 year for follow-up

## 2019-10-25 DIAGNOSIS — E1165 Type 2 diabetes mellitus with hyperglycemia: Secondary | ICD-10-CM | POA: Diagnosis not present

## 2019-11-12 DIAGNOSIS — H26493 Other secondary cataract, bilateral: Secondary | ICD-10-CM | POA: Diagnosis not present

## 2019-11-12 DIAGNOSIS — E113293 Type 2 diabetes mellitus with mild nonproliferative diabetic retinopathy without macular edema, bilateral: Secondary | ICD-10-CM | POA: Diagnosis not present

## 2019-11-12 DIAGNOSIS — H5212 Myopia, left eye: Secondary | ICD-10-CM | POA: Diagnosis not present

## 2019-11-12 DIAGNOSIS — H401131 Primary open-angle glaucoma, bilateral, mild stage: Secondary | ICD-10-CM | POA: Diagnosis not present

## 2019-11-21 DIAGNOSIS — M65321 Trigger finger, right index finger: Secondary | ICD-10-CM | POA: Diagnosis not present

## 2019-11-21 DIAGNOSIS — M65331 Trigger finger, right middle finger: Secondary | ICD-10-CM | POA: Diagnosis not present

## 2019-11-21 DIAGNOSIS — Z23 Encounter for immunization: Secondary | ICD-10-CM | POA: Diagnosis not present

## 2019-11-21 DIAGNOSIS — Z006 Encounter for examination for normal comparison and control in clinical research program: Secondary | ICD-10-CM | POA: Diagnosis not present

## 2019-11-21 DIAGNOSIS — M67441 Ganglion, right hand: Secondary | ICD-10-CM | POA: Diagnosis not present

## 2019-11-21 DIAGNOSIS — R69 Illness, unspecified: Secondary | ICD-10-CM | POA: Diagnosis not present

## 2019-11-22 DIAGNOSIS — Z85828 Personal history of other malignant neoplasm of skin: Secondary | ICD-10-CM | POA: Diagnosis not present

## 2019-11-22 DIAGNOSIS — L821 Other seborrheic keratosis: Secondary | ICD-10-CM | POA: Diagnosis not present

## 2019-11-26 DIAGNOSIS — E113491 Type 2 diabetes mellitus with severe nonproliferative diabetic retinopathy without macular edema, right eye: Secondary | ICD-10-CM | POA: Diagnosis not present

## 2019-11-26 DIAGNOSIS — H43811 Vitreous degeneration, right eye: Secondary | ICD-10-CM | POA: Diagnosis not present

## 2019-11-26 DIAGNOSIS — H3561 Retinal hemorrhage, right eye: Secondary | ICD-10-CM | POA: Diagnosis not present

## 2019-11-26 DIAGNOSIS — E113492 Type 2 diabetes mellitus with severe nonproliferative diabetic retinopathy without macular edema, left eye: Secondary | ICD-10-CM | POA: Diagnosis not present

## 2019-11-27 ENCOUNTER — Encounter: Payer: Self-pay | Admitting: Nurse Practitioner

## 2019-11-27 ENCOUNTER — Ambulatory Visit: Payer: Medicare HMO | Admitting: Nurse Practitioner

## 2019-11-27 VITALS — BP 100/76 | HR 83 | Temp 98.1°F | Ht 64.0 in | Wt 147.1 lb

## 2019-11-27 DIAGNOSIS — R197 Diarrhea, unspecified: Secondary | ICD-10-CM

## 2019-11-27 NOTE — Progress Notes (Signed)
11/27/2019 Dorothy Herring [id number] 15-May-1941   Chief Complaint: Diarrhea   History of Present Illness: Dorothy Herring is a 79 year old female with a past medical history of  left breast cancer (DCIS) s/p radiation and lumpectomy 04/2018 on Anastrozle,  DM II, afib s/p cardioversion 05/2018,  TIA previously on Eliquis '5mg'$  bid (patient stated she is now on Xarelto), hyperlipidemia and glaucoma. Past hysterectomy. She underwent a telehealth visit with Dr. Tarri Glenn on 02/19/2020 with complaints of diarrhea. Labs including a fecal calprotectin, GI panel, ESR and CRP were ordered but not completed She underwent a colonoscopy 03/21/2019, 4 tubular adenomatous polyps and 1 hyperplastic polyps were removed, no evidence of colitis/IBD. AN EGD was done on the same date which showed peptic duodenitis without evidence of celiac disease. No evidence of H. Pylori. She presents today for further evaluation for intermittent diarrhea. She passes mud like stools once or twice weekly, sometimes doesn't get to the bathroom in time and soils self. No bloody diarrhea. No associated abdominal pain. She passes a lot of gas from the rectum. She passes a normal solid stool every other day. She does not take Imodium. No antibiotics for at least 6 months. She eats cereal with milk 2 to 3 days weekly, eats cheese once weekly and ice cream once weekly. She does not associated days of diarrhea are due to eating dairy products. No weight loss. No fever, sweats or chills.   Colonoscopy by Dr. Tarri Glenn 03/21/2019: - Non-bleeding external and internal hemorrhoids. - One 32m tubular adenomatous polyp in the ascending colon, removed. - Five 1 to 4 mm polyps  (4 tubular adenomatous and 1 hyperplastic) in the sigmoid colon, in   the descending colon and in the ascending removed.  - The entire examined colon is normal. No colitis/IBD. - The examined portion of the ileum was normal. Biopsies negative for acute inflammation or        granulomas.  - The examination was otherwise normal on direct and retroflexion views.  EGD 03/20/2020:  - Normal esophagus. - Gastritis. No H.Pylori.  - Erythematous duodenopathy. Biopsied. - A single duodenal polyp. Resected and retrieved. - Small hiatal hernia. - The examination was otherwise normal.  Current Outpatient Medications on File Prior to Visit  Medication Sig Dispense Refill  . anastrozole (ARIMIDEX) 1 MG tablet Take 1 tablet (1 mg total) by mouth daily. 90 tablet 3  . empagliflozin (JARDIANCE) 10 MG TABS tablet Take 10 mg by mouth daily.    .Marland Kitchengabapentin (NEURONTIN) 300 MG capsule Take 300 mg by mouth daily.     . insulin glargine (LANTUS) 100 UNIT/ML injection Inject 24 Units into the skin at bedtime.    .Marland KitchenLUMIGAN 0.01 % SOLN Place 1 drop into both eyes at bedtime.     . rosuvastatin (CRESTOR) 40 MG tablet Take 40 mg by mouth daily.  0   No current facility-administered medications on file prior to visit.   No Known Allergies   Current Medications, Allergies, Past Medical History, Past Surgical History, Family History and Social History were reviewed in [employer].   Physical Exam: Temp 98.1 F (36.7 C)   Ht '5\' 4"'$  (1.626 m)   Wt 147 lb 2 oz (66.7 kg)   BMI 25.25 kg/m  General: Well developed  female in no acute distress. Head: Normocephalic and atraumatic. Eyes:  No scleral icterus. Conjunctiva pink . Ears: Normal auditory acuity. Lungs: Clear throughout to auscultation. Heart: Rhythm sightly  irregular,  no murmur. Abdomen: Soft, nontender and nondistended. No masses or hepatomegaly. Normal bowel sounds x 4 quadrants.  Rectal: Deferred.  Musculoskeletal: Symmetrical with no gross deformities. Extremities: No edema. Neurological: Alert oriented x 4. No focal deficits.  Psychological:  Alert and cooperative. Normal mood and affect  Assessment and Recommendations:  1. Diarrhea predominant IBS. EGD/colonoscpy 03/2019 without  evidence of IBD/microscopic colitis or celiac disease.  -Benefiber 1 tablespoon once daily to bulk up stool as tolerated -Phillip's bacteria probiotic once daily, may add Florastor 1 po bid -Discussed taking Lactaid with each dairy product, patient was not interested in trying -Pancreatic elastase level  -Request copy of CBC, CMP and TSH level from PCP (patient reported having labs done by PCP a few months ago) -Patient will call office if diarrhea occurs daily, I will order GI panel if daily diarrhea occurs -Consider trial with Xifaxan  -Follow up in office with Dr. Tarri Glenn in 3 to 4 months   2. History of atrial fibrillation on Eliquis, patient reported now on Xarelto -Request medication list from PCP  3. History of TIA  4. History of breast cancer

## 2019-11-27 NOTE — Patient Instructions (Addendum)
If you are age 77 or older, your body mass index should be between 23-30. Your Body mass index is 25.25 kg/m. If this is out of the aforementioned range listed, please consider follow up with your Primary Care Provider.  If you are age 18 or younger, your body mass index should be between 19-25. Your Body mass index is 25.25 kg/m. If this is out of the aformentioned range listed, please consider follow up with your Primary Care Provider.   Your provider has requested that you go to the basement level for lab work before leaving today. Press "B" on the elevator. The lab is located at the first door on the left as you exit the elevator.  Please use Benefiber 1 tbsp daily to bulk up stool as tolerated. Phillips probiotic 1 daily You may also try Florastor probiotic 1 by mouth twice daily Follow up in 3-4 months. Request copies of your labs to be sent to our office.  Due to recent changes in healthcare laws, you may see the results of your imaging and laboratory studies on MyChart before your provider has had a chance to review them.  We understand that in some cases there may be results that are confusing or concerning to you. Not all laboratory results come back in the same time frame and the provider may be waiting for multiple results in order to interpret others.  Please give [place] 48 hours in order for your provider to thoroughly review all the results before contacting the office for clarification of your results.   Thank you for choosing [facility] Noralyn Pick, CRNP

## 2019-11-28 DIAGNOSIS — R197 Diarrhea, unspecified: Secondary | ICD-10-CM | POA: Insufficient documentation

## 2019-11-28 NOTE — Progress Notes (Signed)
Reviewed and agree with management plans. ? ?Royce Stegman L. Zelta Enfield, MD, MPH  ?

## 2019-11-29 ENCOUNTER — Telehealth: Payer: Self-pay

## 2019-11-29 NOTE — Telephone Encounter (Signed)
-----   Message from Noralyn Pick, NP sent at 11/28/2019  5:14 PM EST ----- Can you contact pt's pcp's office for an accurate medication list. thx

## 2019-11-29 NOTE — Telephone Encounter (Signed)
Information will be faxed to [phone] ATTN:Colleen per Mrs. Cross RN for Dr. Virgina Jock;

## 2019-11-29 NOTE — Telephone Encounter (Signed)
Left message for Dr. [employer] nurse-Mrs. Cross-for her to call back to the office to provide this information

## 2019-12-04 ENCOUNTER — Telehealth: Payer: Self-pay | Admitting: Nurse Practitioner

## 2019-12-04 NOTE — Telephone Encounter (Signed)
Patient was seen on 3/09,  she thought her appointment was with Dr. Tarri Glenn but her appointment was scheduled with me).  I called the patient today for follow up. I left a msg on her voice mail letting her know Dr. Tarri Glenn reviewed her office appt 3/9 and plan, no new recommendations at this time. Patient to call office if no improvement.

## 2019-12-13 ENCOUNTER — Other Ambulatory Visit: Payer: Self-pay

## 2019-12-13 ENCOUNTER — Ambulatory Visit: Payer: Medicare HMO | Admitting: Internal Medicine

## 2019-12-13 ENCOUNTER — Encounter: Payer: Self-pay | Admitting: Internal Medicine

## 2019-12-13 VITALS — BP 136/70 | HR 87 | Ht 64.0 in | Wt 143.0 lb

## 2019-12-13 DIAGNOSIS — I4819 Other persistent atrial fibrillation: Secondary | ICD-10-CM

## 2019-12-13 DIAGNOSIS — D6869 Other thrombophilia: Secondary | ICD-10-CM | POA: Diagnosis not present

## 2019-12-13 DIAGNOSIS — I10 Essential (primary) hypertension: Secondary | ICD-10-CM | POA: Diagnosis not present

## 2019-12-13 NOTE — Progress Notes (Signed)
Electrophysiology Office Note   Date:  12/13/2019   ID:  Zohal, Reburn Jul 15, 1941, MRN [id number]  PCP:  Shon Baton, MD    Primary Electrophysiologist: Thompson Grayer, MD    CC: afib   History of Present Illness: Dorothy Herring is a 79 y.o. female who presents today for electrophysiology evaluation.   She has longstanding persistent afib for which she is not aware.  She is rate controlled and doing well.  Today, she denies symptoms of palpitations, chest pain, shortness of breath, orthopnea, PND, lower extremity edema, claudication, dizziness, presyncope, syncope, bleeding, or neurologic sequela. The patient is tolerating medications without difficulties and is otherwise without complaint today.    Past Medical History:  Diagnosis Date  . Cancer [facility]) 2019   left breast cancer, s/p XRT s/p lumpectomy  . Cataract    removed both eyes  . Diabetes mellitus   . Ductal carcinoma in situ (DCIS) of left breast 02/06/2018  . Glaucoma   . History of radiation therapy 05/02/18- 05/24/18   Left Breast 42.56 Gy in 16 fractions.   . Hyperlipidemia   . Internal hemorrhoids 11/30/2011  . Longstanding persistent atrial fibrillation ([place])   . Rectal bleeding 11/30/2011  . TIA (transient ischemic attack)    "years ago"   Past Surgical History:  Procedure Laterality Date  . ABDOMINAL HYSTERECTOMY  age 27  . BILATERAL SALPINGOOPHORECTOMY     By patient history  . BREAST BIOPSY    . BREAST LUMPECTOMY WITH RADIOACTIVE SEED LOCALIZATION Left 02/27/2018   Procedure: BREAST LUMPECTOMY WITH RADIOACTIVE SEED LOCALIZATION;  Surgeon: Alphonsa Overall, MD;  Location: [place];  Service: General;  Laterality: Left;  . CARDIOVERSION N/A 05/31/2018   Procedure: CARDIOVERSION;  Surgeon: [name], MD;  Location: [place];  Service: Cardiovascular;  Laterality: N/A;  . COLONOSCOPY       Current Outpatient Medications  Medication Sig Dispense Refill  . anastrozole (ARIMIDEX)  1 MG tablet Take 1 tablet (1 mg total) by mouth daily. 90 tablet 3  . empagliflozin (JARDIANCE) 10 MG TABS tablet Take 10 mg by mouth daily.    Marland Kitchen gabapentin (NEURONTIN) 300 MG capsule Take 300 mg by mouth daily.     . insulin glargine (LANTUS) 100 UNIT/ML injection Inject 24 Units into the skin at bedtime.    Marland Kitchen LUMIGAN 0.01 % SOLN Place 1 drop into both eyes at bedtime.     . rosuvastatin (CRESTOR) 40 MG tablet Take 40 mg by mouth daily.  0  . TRESIBA FLEXTOUCH 100 UNIT/ML FlexTouch Pen Inject 30 Units into the skin daily.    Alveda Reasons 20 MG TABS tablet Take 20 mg by mouth daily.     No current facility-administered medications for this visit.    Allergies:   Patient has no known allergies.   Social History:  The patient  reports that she has never smoked. She has never used smokeless tobacco. She reports current alcohol use. She reports that she does not use drugs.   Family History:  The patient's family history includes Alzheimer's disease in her mother; Lung cancer in her father.    ROS:  Please see the history of present illness.   All other systems are personally reviewed and negative.    PHYSICAL EXAM: VS:  BP 136/70   Pulse 87   Ht 5\' 4"  (1.626 m)   Wt 143 lb (64.9 kg)   SpO2 90%   BMI 24.55 kg/m  , BMI Body  mass index is 24.55 kg/m. GEN: Well nourished, well developed, in no acute distress  HEENT: normal  Neck: no JVD, carotid bruits, or masses Cardiac: iRRR; no murmurs, rubs, or gallops,no edema  Respiratory:    normal work of breathing GI: soft  MS: no deformity or atrophy  Skin: warm and dry  Neuro:  Strength and sensation are intact Psych: euthymic mood, full affect  EKG:  EKG is ordered today. The ekg ordered today is personally reviewed and shows rate controlled afib   Recent Labs: No results found for requested labs within last 8760 hours.  personally reviewed   Lipid Panel  No results found for: CHOL, TRIG, HDL, CHOLHDL, VLDL, LDLCALC,  LDLDIRECT personally reviewed   Wt Readings from Last 3 Encounters:  12/13/19 143 lb (64.9 kg)  11/27/19 147 lb 2 oz (66.7 kg)  10/22/19 143 lb 12.8 oz (65.2 kg)      Other studies personally reviewed: Additional studies/ records that were reviewed today include: [place] records, prior echo,  AF clinic notes, my prior note  Review of the above records today demonstrates: as above   ASSESSMENT AND PLAN:  1.  Longstanding persistent afib Asymptomatic Rate controlled chads2vasc score is at least 6.  She is doing well with xarelto No changes today  2. Prior stroke Importance of long term anticoagulation was stressed tdoay  3. HTN Stable No change required today  Follow-up:  AF clinic annually  Current medicines are reviewed at length with the patient today.   The patient does not have concerns regarding her medicines.  The following changes were made today:  none  Labs/ tests ordered today include:  Orders Placed This Encounter  Procedures  . EKG 12-Lead     Signed, Thompson Grayer, MD  12/13/2019 10:40 AM     [facility] [address] [place] [address] [phone] (office) [phone] (fax)

## 2019-12-13 NOTE — Patient Instructions (Addendum)
Medication Instructions:  Your physician recommends that you continue on your current medications as directed. Please refer to the Current Medication list given to you today.  Labwork: None ordered.  Testing/Procedures: None ordered.  Follow-Up: Your physician wants you to follow-up in: one year with Roderic Palau at the [place] clinic.   You will receive a reminder letter in the mail two months in advance. If you don't receive a letter, please call our office to schedule the follow-up appointment.  Any Other Special Instructions Will Be Listed Below (If Applicable).  If you need a refill on your cardiac medications before your next appointment, please call your pharmacy.

## 2019-12-18 DIAGNOSIS — M65331 Trigger finger, right middle finger: Secondary | ICD-10-CM | POA: Diagnosis not present

## 2019-12-18 DIAGNOSIS — M65321 Trigger finger, right index finger: Secondary | ICD-10-CM | POA: Diagnosis not present

## 2019-12-18 DIAGNOSIS — M67441 Ganglion, right hand: Secondary | ICD-10-CM | POA: Diagnosis not present

## 2020-01-07 DIAGNOSIS — R69 Illness, unspecified: Secondary | ICD-10-CM | POA: Diagnosis not present

## 2020-02-19 DIAGNOSIS — M79641 Pain in right hand: Secondary | ICD-10-CM | POA: Diagnosis not present

## 2020-03-13 DIAGNOSIS — R921 Mammographic calcification found on diagnostic imaging of breast: Secondary | ICD-10-CM | POA: Diagnosis not present

## 2020-03-17 ENCOUNTER — Telehealth: Payer: Self-pay | Admitting: Internal Medicine

## 2020-03-17 NOTE — Telephone Encounter (Signed)
Attempted to call Pt. No answer.

## 2020-03-17 NOTE — Telephone Encounter (Signed)
New message   Patient states that she can't afford this medicationXARELTO 20 MG TABS tablet and wants to see if there is a substitute for this medication or wants to know if she can go off this medication? Please advise.

## 2020-03-18 DIAGNOSIS — E7849 Other hyperlipidemia: Secondary | ICD-10-CM | POA: Diagnosis not present

## 2020-03-18 DIAGNOSIS — R159 Full incontinence of feces: Secondary | ICD-10-CM | POA: Diagnosis not present

## 2020-03-18 DIAGNOSIS — Z0000 Encounter for general adult medical examination without abnormal findings: Secondary | ICD-10-CM | POA: Diagnosis not present

## 2020-03-18 DIAGNOSIS — R82998 Other abnormal findings in urine: Secondary | ICD-10-CM | POA: Diagnosis not present

## 2020-03-18 DIAGNOSIS — Z9119 Patient's noncompliance with other medical treatment and regimen: Secondary | ICD-10-CM | POA: Diagnosis not present

## 2020-03-18 DIAGNOSIS — R946 Abnormal results of thyroid function studies: Secondary | ICD-10-CM | POA: Diagnosis not present

## 2020-03-18 DIAGNOSIS — E1165 Type 2 diabetes mellitus with hyperglycemia: Secondary | ICD-10-CM | POA: Diagnosis not present

## 2020-03-18 DIAGNOSIS — D0512 Intraductal carcinoma in situ of left breast: Secondary | ICD-10-CM | POA: Diagnosis not present

## 2020-03-18 DIAGNOSIS — E559 Vitamin D deficiency, unspecified: Secondary | ICD-10-CM | POA: Diagnosis not present

## 2020-03-18 DIAGNOSIS — R197 Diarrhea, unspecified: Secondary | ICD-10-CM | POA: Diagnosis not present

## 2020-03-18 DIAGNOSIS — M6530 Trigger finger, unspecified finger: Secondary | ICD-10-CM | POA: Diagnosis not present

## 2020-03-18 NOTE — Telephone Encounter (Signed)
**Note De-Identified Dorothy Herring Obfuscation** I called the pt at her home phone but got no answer and no way to leave a message. I then called her cell phone and again got no answer so I left a message asking her to call Jeani Hawking back at [facility] at [phone] to discuss Xarelto cost.

## 2020-03-20 NOTE — Telephone Encounter (Signed)
**Note De-Identified Dorothy Herring Obfuscation** No answer at either numbers listed in the pts chart. I did leave another message on her cell phone asking her to call me back.

## 2020-03-26 ENCOUNTER — Telehealth: Payer: Self-pay | Admitting: Hematology and Oncology

## 2020-03-26 NOTE — Telephone Encounter (Signed)
Scheduled appt per 7/7 sch msg - mailed letter with appt date and time

## 2020-03-27 NOTE — Telephone Encounter (Signed)
Mailed letter to Pt advising of Pt's options d/t unable to contact Pt by phone x 3.

## 2020-04-02 ENCOUNTER — Telehealth: Payer: Self-pay | Admitting: *Deleted

## 2020-04-02 NOTE — Telephone Encounter (Signed)
Anastrozole order able to be filled for patient at cost of $[facility].00 per [facility].   Disregarding [facility] [employer] denial response letter received requesting prior authorization.

## 2020-04-09 NOTE — Telephone Encounter (Signed)
Dr. Jackalyn Lombard nurse is unavailable today. Jeani Hawking our medication assistance nurse is unavailable this afternoon. I will forward to Lynn's basket to review tomorrow when she is back in the office.

## 2020-04-09 NOTE — Telephone Encounter (Signed)
Pt called back in regards to the letter she got from our office. Please call cell phone  to discuss letter.

## 2020-04-10 NOTE — Telephone Encounter (Signed)
Will do, thanks

## 2020-04-10 NOTE — Telephone Encounter (Signed)
**Note De-Identified Dunbar Buras Obfuscation** I called [facility] and was advised that the pts cost for Xarelto is currently $357/90 day supply and that the pt is in the donut hole.  I called the pt and we discussed Dorothy Herring and [facility] Pt Asst and she is interested in applying.  I gave her [employer] pt asst phone number and advised her to call them with questions regarding their program and to request that they mail her an application. She is aware to completed her part of the application, obtain documents required by [employer] and to bring all to the office and that we will handle the provider part of the application and will fax all to [employer] pt asst 's program.  She states that she is down to only a couple pills of Xarelto and cannot afford to refill. She is advised that we are leaving her 2 weeks of Xarelto samples in the front office for her to pick up. She verbalized understanding to all info given and thanked me for calling her to discuss her options.

## 2020-04-17 DIAGNOSIS — M25741 Osteophyte, right hand: Secondary | ICD-10-CM | POA: Diagnosis not present

## 2020-04-17 DIAGNOSIS — M25742 Osteophyte, left hand: Secondary | ICD-10-CM | POA: Diagnosis not present

## 2020-04-17 DIAGNOSIS — M25542 Pain in joints of left hand: Secondary | ICD-10-CM | POA: Diagnosis not present

## 2020-04-17 DIAGNOSIS — M19041 Primary osteoarthritis, right hand: Secondary | ICD-10-CM | POA: Diagnosis not present

## 2020-04-17 DIAGNOSIS — M25541 Pain in joints of right hand: Secondary | ICD-10-CM | POA: Diagnosis not present

## 2020-04-17 DIAGNOSIS — M19042 Primary osteoarthritis, left hand: Secondary | ICD-10-CM | POA: Diagnosis not present

## 2020-05-12 ENCOUNTER — Encounter (INDEPENDENT_AMBULATORY_CARE_PROVIDER_SITE_OTHER): Payer: Self-pay | Admitting: Ophthalmology

## 2020-05-12 ENCOUNTER — Ambulatory Visit (INDEPENDENT_AMBULATORY_CARE_PROVIDER_SITE_OTHER): Payer: Medicare HMO | Admitting: Ophthalmology

## 2020-05-12 ENCOUNTER — Other Ambulatory Visit: Payer: Self-pay

## 2020-05-12 DIAGNOSIS — H26491 Other secondary cataract, right eye: Secondary | ICD-10-CM

## 2020-05-12 DIAGNOSIS — N390 Urinary tract infection, site not specified: Secondary | ICD-10-CM | POA: Diagnosis not present

## 2020-05-12 DIAGNOSIS — E113411 Type 2 diabetes mellitus with severe nonproliferative diabetic retinopathy with macular edema, right eye: Secondary | ICD-10-CM | POA: Diagnosis not present

## 2020-05-12 DIAGNOSIS — E113492 Type 2 diabetes mellitus with severe nonproliferative diabetic retinopathy without macular edema, left eye: Secondary | ICD-10-CM | POA: Diagnosis not present

## 2020-05-12 DIAGNOSIS — R350 Frequency of micturition: Secondary | ICD-10-CM | POA: Diagnosis not present

## 2020-05-12 HISTORY — DX: Other secondary cataract, right eye: H26.491

## 2020-05-12 NOTE — Assessment & Plan Note (Signed)
Mild CSME with excellent visual acuity, option to observe will be undertaken.

## 2020-05-12 NOTE — Patient Instructions (Signed)
Patient to notify if new visual acuity decline or distortions develop

## 2020-05-12 NOTE — Progress Notes (Signed)
05/12/2020     CHIEF COMPLAINT Patient presents for Retina Follow Up   HISTORY OF PRESENT ILLNESS: Dorothy Herring is a 79 y.o. female who presents to the clinic today for:   HPI    Retina Follow Up    Patient presents with  Diabetic Retinopathy.  In both eyes.  This started 5 months ago.  Severity is mild.  Duration of 5 months.  Since onset it is stable.          Comments    5 Month Diabetic F/U OU  Pt denies noticeable changes to [place] OU since last visit. Pt denies ocular pain, flashes of light, or floaters OU.  A1c: "about 8", 12/2019 LBS: "about 175", a couple days ago       Last edited by Rockie Neighbours, [place] on 05/12/2020 10:48 AM. (History)      Referring physician: Shon Baton, MD [place],  [place] [address]  HISTORICAL INFORMATION:   Selected notes from the [facility]: Current Outpatient Medications (Ophthalmic Drugs)  Medication Sig  . LUMIGAN 0.01 % SOLN Place 1 drop into both eyes at bedtime.    No current facility-administered medications for this visit. (Ophthalmic Drugs)   Current Outpatient Medications (Other)  Medication Sig  . anastrozole (ARIMIDEX) 1 MG tablet Take 1 tablet (1 mg total) by mouth daily.  . empagliflozin (JARDIANCE) 10 MG TABS tablet Take 10 mg by mouth daily.  Marland Kitchen gabapentin (NEURONTIN) 300 MG capsule Take 300 mg by mouth daily.   . insulin glargine (LANTUS) 100 UNIT/ML injection Inject 24 Units into the skin at bedtime.  . rosuvastatin (CRESTOR) 40 MG tablet Take 40 mg by mouth daily.  Tyler Aas FLEXTOUCH 100 UNIT/ML FlexTouch Pen Inject 30 Units into the skin daily.  Alveda Reasons 20 MG TABS tablet Take 20 mg by mouth daily.   No current facility-administered medications for this visit. (Other)      REVIEW OF SYSTEMS:    ALLERGIES No Known Allergies  PAST MEDICAL HISTORY Past Medical History:  Diagnosis Date  . Cancer [facility]) 2019   left breast cancer, s/p XRT s/p  lumpectomy  . Cataract    removed both eyes  . Diabetes mellitus   . Ductal carcinoma in situ (DCIS) of left breast 02/06/2018  . Glaucoma   . History of radiation therapy 05/02/18- 05/24/18   Left Breast 42.56 Gy in 16 fractions.   . Hyperlipidemia   . Internal hemorrhoids 11/30/2011  . Longstanding persistent atrial fibrillation ([place])   . Rectal bleeding 11/30/2011  . TIA (transient ischemic attack)    "years ago"   Past Surgical History:  Procedure Laterality Date  . ABDOMINAL HYSTERECTOMY  age 53  . BILATERAL SALPINGOOPHORECTOMY     By patient history  . BREAST BIOPSY    . BREAST LUMPECTOMY WITH RADIOACTIVE SEED LOCALIZATION Left 02/27/2018   Procedure: BREAST LUMPECTOMY WITH RADIOACTIVE SEED LOCALIZATION;  Surgeon: Alphonsa Overall, MD;  Location: [place];  Service: General;  Laterality: Left;  . CARDIOVERSION N/A 05/31/2018   Procedure: CARDIOVERSION;  Surgeon: [name], MD;  Location: [place];  Service: Cardiovascular;  Laterality: N/A;  . COLONOSCOPY      FAMILY HISTORY Family History  Problem Relation Age of Onset  . Lung cancer Father   . Alzheimer's disease Mother   . Colon cancer Neg Hx   . Colon polyps Neg Hx   . Esophageal cancer Neg  Hx   . Rectal cancer Neg Hx   . Stomach cancer Neg Hx     SOCIAL HISTORY Social History   Tobacco Use  . Smoking status: Never Smoker  . Smokeless tobacco: Never Used  Vaping Use  . Vaping Use: Never used  Substance Use Topics  . Alcohol use: Yes    Comment: 3 drinks per week (liquor)  . Drug use: No         OPHTHALMIC EXAM:  Base Eye Exam    Visual Acuity (ETDRS)      Right Left   Dist [place] 20/50 +1 20/100   Dist ph  20/25 +2 20/20 -2  Pt forgot glasses       Tonometry (Tonopen, 10:54 AM)      Right Left   Pressure 18 14       Pupils      Pupils Dark Light Shape React APD   Right PERRL 4 3 Round Brisk None   Left PERRL 4 3 Round Brisk None       Visual Fields (Counting  fingers)      Left Right    Full Full       Extraocular Movement      Right Left    Full Full       Neuro/Psych    Oriented x3: Yes   Mood/Affect: Normal       Dilation    Both eyes: 1.0% Mydriacyl, 2.5% Phenylephrine @ 10:54 AM        Slit Lamp and Fundus Exam    External Exam      Right Left   External Normal Normal       Slit Lamp Exam      Right Left   Lids/Lashes Normal Normal   Conjunctiva/Sclera White and quiet White and quiet   Cornea Clear Clear   Anterior Chamber Deep and quiet Deep and quiet   Iris Round and reactive Round and reactive   Lens Posterior chamber intraocular lens, 1+ Posterior capsular opacification Posterior chamber intraocular lens   Anterior Vitreous Normal Normal       Fundus Exam      Right Left   Posterior Vitreous Posterior vitreous detachment Normal   Disc Normal Normal   C/D Ratio 0.45 0.55   Macula Microaneurysms, Macular thickening, Mild clinically significant macular edema Microaneurysms, no macular thickening, no clinically significant macular edema   Vessels NPDR-Severe NPDR-Severe   Periphery Normal, mild scatter in temporal peripheral anterior retina Normal, mild scatter in temporal peripheral anterior retina          IMAGING AND PROCEDURES  Imaging and Procedures for 05/12/20  OCT, Retina - OU - Both Eyes       Right Eye Quality was good. Scan locations included subfoveal. Central Foveal Thickness: 242.   Left Eye Quality was good. Scan locations included subfoveal. Central Foveal Thickness: 249. Progression has been stable.   Notes Mild CSME superior to the fovea, not threatening central involvement OD, with Good acuity option to observe                ASSESSMENT/PLAN:  Posterior capsular opacification, right PC opacification is present and accounts for visual symptoms. A YAG PC is recommended.  This procedure will maximize visual potential and allow enhanced medical monitoring of retinal  conditions.  The risk and benefits were explained to the patient, including the risk of an increase in intraocular pressure temporarily after the procedure.   Recommend follow-up with  Dr. Katy Apo for consideration of YAG laser capsulotomy OD  Severe nonproliferative diabetic retinopathy of right eye, with macular edema, associated with type 2 diabetes mellitus (HCC) Mild CSME with excellent visual acuity, option to observe will be undertaken.      ICD-10-CM   1. Severe nonproliferative diabetic retinopathy of right eye, with macular edema, associated with type 2 diabetes mellitus (HCC)  E11.3411 OCT, Retina - OU - Both Eyes  2. Severe nonproliferative diabetic retinopathy of left eye without macular edema associated with type 2 diabetes mellitus (HCC)  X44.8185 OCT, Retina - OU - Both Eyes  3. Posterior capsular opacification, right  H26.491     1.  Follow-up to Dr. Katy Apo for consideration of YAG capsulotomy OD  2.  Excellent blood sugar control is likely to prevent further progression of mild CSME OD will follow up in 4 months  3.  Ophthalmic Meds Ordered this visit:  No orders of the defined types were placed in this encounter.      Return in about 4 months (around 09/11/2020) for DILATE OU, OCT.  Patient Instructions  Patient to notify if new visual acuity decline or distortions develop    Explained the diagnoses, plan, and follow up with the patient and they expressed understanding.  Patient expressed understanding of the importance of proper follow up care.   Clent Demark Meena Barrantes M.D. Diseases & Surgery of the Retina and Vitreous Retina & Diabetic [place] 05/12/20     Abbreviations: M myopia (nearsighted); A astigmatism; H hyperopia (farsighted); P presbyopia; Mrx spectacle prescription;  CTL contact lenses; OD right eye; OS left eye; OU both eyes  XT exotropia; ET esotropia; PEK punctate epithelial keratitis; PEE punctate epithelial erosions; DES dry eye  syndrome; MGD meibomian gland dysfunction; ATs artificial tears; PFAT's preservative free artificial tears; [place] nuclear sclerotic cataract; PSC posterior subcapsular cataract; ERM epi-retinal membrane; PVD posterior vitreous detachment; RD retinal detachment; DM diabetes mellitus; DR diabetic retinopathy; NPDR non-proliferative diabetic retinopathy; PDR proliferative diabetic retinopathy; CSME clinically significant macular edema; DME diabetic macular edema; dbh dot blot hemorrhages; CWS cotton wool spot; POAG primary open angle glaucoma; C/D cup-to-disc ratio; HVF humphrey visual field; GVF goldmann visual field; OCT optical coherence tomography; IOP intraocular pressure; BRVO Branch retinal vein occlusion; CRVO central retinal vein occlusion; CRAO central retinal artery occlusion; BRAO branch retinal artery occlusion; RT retinal tear; SB scleral buckle; PPV pars plana vitrectomy; VH Vitreous hemorrhage; PRP panretinal laser photocoagulation; IVK intravitreal kenalog; VMT vitreomacular traction; MH Macular hole;  NVD neovascularization of the disc; NVE neovascularization elsewhere; AREDS age related eye disease study; ARMD age related macular degeneration; POAG primary open angle glaucoma; EBMD epithelial/anterior basement membrane dystrophy; ACIOL anterior chamber intraocular lens; IOL intraocular lens; PCIOL posterior chamber intraocular lens; Phaco/IOL phacoemulsification with intraocular lens placement; [place] photorefractive keratectomy; LASIK laser assisted in situ keratomileusis; HTN hypertension; DM diabetes mellitus; COPD chronic obstructive pulmonary disease

## 2020-05-12 NOTE — Assessment & Plan Note (Signed)
PC opacification is present and accounts for visual symptoms. A YAG PC is recommended.  This procedure will maximize visual potential and allow enhanced medical monitoring of retinal conditions.  The risk and benefits were explained to the patient, including the risk of an increase in intraocular pressure temporarily after the procedure.   Recommend follow-up with Dr. Katy Apo for consideration of YAG laser capsulotomy OD

## 2020-05-13 DIAGNOSIS — R69 Illness, unspecified: Secondary | ICD-10-CM | POA: Diagnosis not present

## 2020-05-23 DIAGNOSIS — R3915 Urgency of urination: Secondary | ICD-10-CM | POA: Diagnosis not present

## 2020-05-23 DIAGNOSIS — R350 Frequency of micturition: Secondary | ICD-10-CM | POA: Diagnosis not present

## 2020-05-28 ENCOUNTER — Telehealth: Payer: Self-pay | Admitting: Internal Medicine

## 2020-05-28 NOTE — Telephone Encounter (Signed)
The patient is taking Xarelto. She was previously on Eliquis. She found a 3 month supply at her house from when they switched her and is asking if she can take this while she is in the donut hole with her insurance.    Dr. Rayann Heman advised to take the Xarelto however if she can not do that financially she needs to be on something. At that point she should take the Eliquis.

## 2020-05-28 NOTE — Telephone Encounter (Signed)
New message:    Patient calling concering her medications and would like for some one to call her back.

## 2020-05-28 NOTE — Telephone Encounter (Signed)
Spoke the patient and gave her Dr. Jackalyn Lombard advisement. Also educated her how to switch from one medication to the other if needed. She verbalized understanding.

## 2020-06-02 DIAGNOSIS — H26493 Other secondary cataract, bilateral: Secondary | ICD-10-CM | POA: Diagnosis not present

## 2020-06-02 DIAGNOSIS — Z961 Presence of intraocular lens: Secondary | ICD-10-CM | POA: Diagnosis not present

## 2020-06-09 DIAGNOSIS — M25512 Pain in left shoulder: Secondary | ICD-10-CM | POA: Diagnosis not present

## 2020-07-14 DIAGNOSIS — N390 Urinary tract infection, site not specified: Secondary | ICD-10-CM | POA: Diagnosis not present

## 2020-07-14 DIAGNOSIS — R350 Frequency of micturition: Secondary | ICD-10-CM | POA: Diagnosis not present

## 2020-07-15 DIAGNOSIS — H26491 Other secondary cataract, right eye: Secondary | ICD-10-CM | POA: Diagnosis not present

## 2020-08-08 DIAGNOSIS — E785 Hyperlipidemia, unspecified: Secondary | ICD-10-CM | POA: Diagnosis not present

## 2020-08-08 DIAGNOSIS — I10 Essential (primary) hypertension: Secondary | ICD-10-CM | POA: Diagnosis not present

## 2020-08-08 DIAGNOSIS — E1140 Type 2 diabetes mellitus with diabetic neuropathy, unspecified: Secondary | ICD-10-CM | POA: Diagnosis not present

## 2020-08-08 DIAGNOSIS — E1165 Type 2 diabetes mellitus with hyperglycemia: Secondary | ICD-10-CM | POA: Diagnosis not present

## 2020-08-08 DIAGNOSIS — I4892 Unspecified atrial flutter: Secondary | ICD-10-CM | POA: Diagnosis not present

## 2020-08-08 DIAGNOSIS — Z794 Long term (current) use of insulin: Secondary | ICD-10-CM | POA: Diagnosis not present

## 2020-08-08 DIAGNOSIS — Z9119 Patient's noncompliance with other medical treatment and regimen: Secondary | ICD-10-CM | POA: Diagnosis not present

## 2020-08-08 DIAGNOSIS — Z7901 Long term (current) use of anticoagulants: Secondary | ICD-10-CM | POA: Diagnosis not present

## 2020-08-08 DIAGNOSIS — E1169 Type 2 diabetes mellitus with other specified complication: Secondary | ICD-10-CM | POA: Diagnosis not present

## 2020-08-08 DIAGNOSIS — E11319 Type 2 diabetes mellitus with unspecified diabetic retinopathy without macular edema: Secondary | ICD-10-CM | POA: Diagnosis not present

## 2020-09-10 ENCOUNTER — Telehealth: Payer: Self-pay | Admitting: Hematology and Oncology

## 2020-09-10 NOTE — Telephone Encounter (Signed)
Rescheduled appointment due to provider PAL. Patient is aware of changes. 

## 2020-09-23 DIAGNOSIS — D0512 Intraductal carcinoma in situ of left breast: Secondary | ICD-10-CM | POA: Diagnosis not present

## 2020-09-23 DIAGNOSIS — M8589 Other specified disorders of bone density and structure, multiple sites: Secondary | ICD-10-CM | POA: Diagnosis not present

## 2020-09-23 DIAGNOSIS — E559 Vitamin D deficiency, unspecified: Secondary | ICD-10-CM | POA: Diagnosis not present

## 2020-09-23 DIAGNOSIS — M1990 Unspecified osteoarthritis, unspecified site: Secondary | ICD-10-CM | POA: Diagnosis not present

## 2020-09-24 ENCOUNTER — Other Ambulatory Visit: Payer: Self-pay

## 2020-09-24 ENCOUNTER — Encounter (INDEPENDENT_AMBULATORY_CARE_PROVIDER_SITE_OTHER): Payer: Self-pay | Admitting: Ophthalmology

## 2020-09-24 ENCOUNTER — Ambulatory Visit (INDEPENDENT_AMBULATORY_CARE_PROVIDER_SITE_OTHER): Payer: Medicare HMO | Admitting: Ophthalmology

## 2020-09-24 DIAGNOSIS — E113492 Type 2 diabetes mellitus with severe nonproliferative diabetic retinopathy without macular edema, left eye: Secondary | ICD-10-CM | POA: Diagnosis not present

## 2020-09-24 DIAGNOSIS — H26491 Other secondary cataract, right eye: Secondary | ICD-10-CM

## 2020-09-24 DIAGNOSIS — E113411 Type 2 diabetes mellitus with severe nonproliferative diabetic retinopathy with macular edema, right eye: Secondary | ICD-10-CM

## 2020-09-24 DIAGNOSIS — H26492 Other secondary cataract, left eye: Secondary | ICD-10-CM | POA: Diagnosis not present

## 2020-09-24 NOTE — Assessment & Plan Note (Signed)
Minor, not in the visual axis OS of test of today, observe

## 2020-09-24 NOTE — Assessment & Plan Note (Signed)
The nature of severe nonproliferative diabetic retinopathy discussed with the patient as well as the need for more frequent follow up and likely progression to proliferative disease in the near future. The options of continued observation versus panretinal photocoagulation at this time were reviewed as well as the risks, benefits, and alternatives. More recent option includes the use of ocular injectable medications to slow progression of retinal disease. Tight control of glucose, blood pressure, and serum lipid levels were recommended under the direction of general physician or endocrinologist, as well as avoidance of smoking and maintenance of normal body weight. The 2-year risk of progression to proliferative diabetic retinopathy is 60%.  No progression, no active CSME, will extend interval examination to 6 months with good blood sugar control

## 2020-09-24 NOTE — Progress Notes (Signed)
09/24/2020     CHIEF COMPLAINT Patient presents for Retina Follow Up (5 Month F/U OU//Pt denies noticeable changes to [place] OU since last visit. Pt denies ocular pain, flashes of light, or floaters OU. //A1c: 9.0, does not recall when/LBS: 175 checked 2 days ago/)   HISTORY OF PRESENT ILLNESS: Dorothy Herring is a 80 y.o. female who presents to the clinic today for:   HPI    Retina Follow Up    Patient presents with  Diabetic Retinopathy.  In right eye.  This started 5 months ago.  Severity is mild.  Duration of 5 months.  Since onset it is stable. Additional comments: 5 Month F/U OU  Pt denies noticeable changes to [place] OU since last visit. Pt denies ocular pain, flashes of light, or floaters OU.   A1c: 9.0, does not recall when LBS: 175 checked 2 days ago        Last edited by Ileana Roup, COA on 09/24/2020  9:22 AM. (History)      Referring physician: Creola Corn, MD [address] [place],  [place] [address]  HISTORICAL INFORMATION:   Selected notes from the MEDICAL RECORD NUMBER       CURRENT MEDICATIONS: Current Outpatient Medications (Ophthalmic Drugs)  Medication Sig  . LUMIGAN 0.01 % SOLN Place 1 drop into both eyes at bedtime.    No current facility-administered medications for this visit. (Ophthalmic Drugs)   Current Outpatient Medications (Other)  Medication Sig  . anastrozole (ARIMIDEX) 1 MG tablet Take 1 tablet (1 mg total) by mouth daily.  . empagliflozin (JARDIANCE) 10 MG TABS tablet Take 10 mg by mouth daily.  Marland Kitchen gabapentin (NEURONTIN) 300 MG capsule Take 300 mg by mouth daily.   . insulin glargine (LANTUS) 100 UNIT/ML injection Inject 24 Units into the skin at bedtime.  . rosuvastatin (CRESTOR) 40 MG tablet Take 40 mg by mouth daily.  Evaristo Bury FLEXTOUCH 100 UNIT/ML FlexTouch Pen Inject 30 Units into the skin daily.  Carlena Hurl 20 MG TABS tablet Take 20 mg by mouth daily.   No current facility-administered medications for this visit. (Other)       REVIEW OF SYSTEMS:    ALLERGIES No Known Allergies  PAST MEDICAL HISTORY Past Medical History:  Diagnosis Date  . Cancer [facility]) 2019   left breast cancer, s/p XRT s/p lumpectomy  . Cataract    removed both eyes  . Diabetes mellitus   . Ductal carcinoma in situ (DCIS) of left breast 02/06/2018  . Glaucoma   . History of radiation therapy 05/02/18- 05/24/18   Left Breast 42.56 Gy in 16 fractions.   . Hyperlipidemia   . Internal hemorrhoids 11/30/2011  . Longstanding persistent atrial fibrillation (HCC)   . Posterior capsular opacification, right 05/12/2020   Recent YAG capsulotomy with Dr. Antony Contras, condition resolved  . Rectal bleeding 11/30/2011  . TIA (transient ischemic attack)    "years ago"   Past Surgical History:  Procedure Laterality Date  . ABDOMINAL HYSTERECTOMY  age 69  . BILATERAL SALPINGOOPHORECTOMY     By patient history  . BREAST BIOPSY    . BREAST LUMPECTOMY WITH RADIOACTIVE SEED LOCALIZATION Left 02/27/2018   Procedure: BREAST LUMPECTOMY WITH RADIOACTIVE SEED LOCALIZATION;  Surgeon: Ovidio Kin, MD;  Location: [facility];  Service: General;  Laterality: Left;  . CARDIOVERSION N/A 05/31/2018   Procedure: CARDIOVERSION;  Surgeon: Chilton Si, MD;  Location: [facility];  Service: Cardiovascular;  Laterality: N/A;  . COLONOSCOPY  FAMILY HISTORY Family History  Problem Relation Age of Onset  . Lung cancer Father   . Alzheimer's disease Mother   . Colon cancer Neg Hx   . Colon polyps Neg Hx   . Esophageal cancer Neg Hx   . Rectal cancer Neg Hx   . Stomach cancer Neg Hx     SOCIAL HISTORY Social History   Tobacco Use  . Smoking status: Never Smoker  . Smokeless tobacco: Never Used  Vaping Use  . Vaping Use: Never used  Substance Use Topics  . Alcohol use: Yes    Comment: 3 drinks per week (liquor)  . Drug use: No         OPHTHALMIC EXAM: Base Eye Exam    Visual Acuity (ETDRS)      Right Left   Dist [place]  20/25 +1 20/70 -3   Dist ph [place]  20/25 +2       Tonometry (Tonopen, 9:23 AM)      Right Left   Pressure 13 10       Pupils      Pupils Dark Light Shape React APD   Right PERRL 4 3 Round Brisk None   Left PERRL 4 3 Round Brisk None       Visual Fields (Counting fingers)      Left Right    Full Full       Extraocular Movement      Right Left    Full Full       Neuro/Psych    Oriented x3: Yes   Mood/Affect: Normal       Dilation    Both eyes: 1.0% Mydriacyl, 2.5% Phenylephrine @ 9:26 AM        Slit Lamp and Fundus Exam    External Exam      Right Left   External Normal Normal       Slit Lamp Exam      Right Left   Lids/Lashes Normal Normal   Conjunctiva/Sclera White and quiet White and quiet   Cornea Clear Clear   Anterior Chamber Deep and quiet Deep and quiet   Iris Round and reactive Round and reactive   Lens Posterior chamber intraocular lens,, Posterior capsular opacification, Open posterior capsule Posterior chamber intraocular lens, 1+ Posterior capsular opacification, not in vis.axis   Anterior Vitreous Normal Normal       Fundus Exam      Right Left   Posterior Vitreous Posterior vitreous detachment Normal   Disc Normal Normal   C/D Ratio 0.55 0.55   Macula Microaneurysms, Macular thickening, no clinically significant macular edema Microaneurysms, no macular thickening, no clinically significant macular edema, Hard drusen   Vessels NPDR-Severe NPDR-Severe   Periphery Normal, mild scatter in temporal peripheral anterior retina Normal, mild scatter in temporal peripheral anterior retina          IMAGING AND PROCEDURES  Imaging and Procedures for 09/24/20  OCT, Retina - OU - Both Eyes       Right Eye Quality was good. Scan locations included subfoveal. Central Foveal Thickness: 247. Progression has been stable. Findings include normal foveal contour.   Left Eye Quality was good. Scan locations included subfoveal. Central Foveal Thickness:  252. Progression has been stable. Findings include normal foveal contour.   Notes No active CSME either eye                ASSESSMENT/PLAN:  Posterior capsular opacification, left Minor, not in the visual axis OS of  test of today, observe  Severe nonproliferative diabetic retinopathy of left eye without macular edema associated with type 2 diabetes mellitus ([place]) The nature of severe nonproliferative diabetic retinopathy discussed with the patient as well as the need for more frequent follow up and likely progression to proliferative disease in the near future. The options of continued observation versus panretinal photocoagulation at this time were reviewed as well as the risks, benefits, and alternatives. More recent option includes the use of ocular injectable medications to slow progression of retinal disease. Tight control of glucose, blood pressure, and serum lipid levels were recommended under the direction of general physician or endocrinologist, as well as avoidance of smoking and maintenance of normal body weight. The 2-year risk of progression to proliferative diabetic retinopathy is 60%.  No progression, no active CSME, will extend interval examination to 6 months with good blood sugar control  Severe nonproliferative diabetic retinopathy of right eye, with macular edema, associated with type 2 diabetes mellitus ([place]) CSME OD now resolved, follow-up in 6 months      ICD-10-CM   1. Severe nonproliferative diabetic retinopathy of right eye, with macular edema, associated with type 2 diabetes mellitus (HCC)  E11.3411 OCT, Retina - OU - Both Eyes  2. Severe nonproliferative diabetic retinopathy of left eye without macular edema associated with type 2 diabetes mellitus (HCC)  [id number] OCT, Retina - OU - Both Eyes  3. Posterior capsular opacification, right  H26.491   4. Posterior capsular opacification, left  H26.492     1. OU stable level of diabetic retinopathy.  2.  Follow-up as scheduled with Dr. Katy Apo 3. Follow-up dilated examination OU in 6 months here to monitor retinopathy  Ophthalmic Meds Ordered this visit:  No orders of the defined types were placed in this encounter.      Return in about 6 months (around 03/24/2021) for DILATE OU, OCT.  There are no Patient Instructions on file for this visit.   Explained the diagnoses, plan, and follow up with the patient and they expressed understanding.  Patient expressed understanding of the importance of proper follow up care.   Clent Demark Anselm Aumiller M.D. Diseases & Surgery of the Retina and Vitreous Retina & Diabetic [place] 09/24/20     Abbreviations: M myopia (nearsighted); A astigmatism; H hyperopia (farsighted); P presbyopia; Mrx spectacle prescription;  CTL contact lenses; OD right eye; OS left eye; OU both eyes  XT exotropia; ET esotropia; PEK punctate epithelial keratitis; PEE punctate epithelial erosions; DES dry eye syndrome; MGD meibomian gland dysfunction; ATs artificial tears; PFAT's preservative free artificial tears; [place] nuclear sclerotic cataract; PSC posterior subcapsular cataract; ERM epi-retinal membrane; PVD posterior vitreous detachment; RD retinal detachment; DM diabetes mellitus; DR diabetic retinopathy; NPDR non-proliferative diabetic retinopathy; PDR proliferative diabetic retinopathy; CSME clinically significant macular edema; DME diabetic macular edema; dbh dot blot hemorrhages; CWS cotton wool spot; POAG primary open angle glaucoma; C/D cup-to-disc ratio; HVF humphrey visual field; GVF goldmann visual field; OCT optical coherence tomography; IOP intraocular pressure; BRVO Branch retinal vein occlusion; CRVO central retinal vein occlusion; CRAO central retinal artery occlusion; BRAO branch retinal artery occlusion; RT retinal tear; SB scleral buckle; PPV pars plana vitrectomy; VH Vitreous hemorrhage; PRP panretinal laser photocoagulation; IVK intravitreal kenalog; VMT  vitreomacular traction; MH Macular hole;  NVD neovascularization of the disc; NVE neovascularization elsewhere; AREDS age related eye disease study; ARMD age related macular degeneration; POAG primary open angle glaucoma; EBMD epithelial/anterior basement membrane dystrophy; ACIOL anterior chamber intraocular lens; IOL intraocular lens;  PCIOL posterior chamber intraocular lens; Phaco/IOL phacoemulsification with intraocular lens placement; [place] photorefractive keratectomy; LASIK laser assisted in situ keratomileusis; HTN hypertension; DM diabetes mellitus; COPD chronic obstructive pulmonary disease

## 2020-09-24 NOTE — Assessment & Plan Note (Signed)
CSME OD now resolved, follow-up in 6 months

## 2020-09-25 ENCOUNTER — Encounter (INDEPENDENT_AMBULATORY_CARE_PROVIDER_SITE_OTHER): Payer: Medicare HMO | Admitting: Ophthalmology

## 2020-10-06 ENCOUNTER — Encounter: Payer: Medicare HMO | Admitting: Obstetrics and Gynecology

## 2020-10-27 ENCOUNTER — Ambulatory Visit: Payer: Medicare HMO | Admitting: Hematology and Oncology

## 2020-10-27 NOTE — Progress Notes (Signed)
Patient Care Team: Shon Baton, MD as PCP - General (Internal Medicine) Alphonsa Overall, MD as Consulting Physician (General Surgery) Nicholas Lose, MD as Consulting Physician (Hematology and Oncology) Eppie Gibson, MD as Attending Physician (Radiation Oncology) Gardenia Phlegm, NP as Nurse Practitioner (Hematology and Oncology)  DIAGNOSIS:    ICD-10-CM   1. Ductal carcinoma in situ (DCIS) of left breast  D05.12     SUMMARY OF ONCOLOGIC HISTORY: Oncology History  Ductal carcinoma in situ (DCIS) of left breast  02/01/2018 Initial Diagnosis   Left nipple discharge, mammogram showing asymmetry, ultrasound reveals 0.5 cm area of concern at 12 o'clock position 1 cm from the nipple, axilla negative, biopsy revealed intermediate grade DCIS ER 100%, PR 100%, Tis N0 stage 0 clinical stage   02/27/2018 Surgery   Left lumpectomy: DCIS with calcifications, intermediate grade, 0.6 cm, margins negative, ER 100%, PR 100%, Tis N0 stage 0    05/02/2018 - 05/24/2018 Radiation Therapy   Adjuvant radiation therapy   Left Breast / 42.56 Gy in 16 fractions   06/2018 -  Anti-estrogen oral therapy   Anastrozole daily     CHIEF COMPLIANT: Follow-up of left breast DCIS on anastrozole   INTERVAL HISTORY: Dorothy Herring is a 80 y.o. with above-mentioned history of left breast DCIS who underwent a lumpectomy, radiation and is currently on antiestrogen therapy with anastrozole. Mammogram on 03/13/20 showed stable left breast calcifications and no evidence of malignancy bilaterally. She presents to the clinic today for follow-up. Other than mild hot flashes, shes tolerating anastrozole very well.  ALLERGIES:  has No Known Allergies.  MEDICATIONS:  Current Outpatient Medications  Medication Sig Dispense Refill  . anastrozole (ARIMIDEX) 1 MG tablet Take 1 tablet (1 mg total) by mouth daily. 90 tablet 3  . empagliflozin (JARDIANCE) 10 MG TABS tablet Take 10 mg by mouth daily.    Marland Kitchen gabapentin (NEURONTIN)  300 MG capsule Take 300 mg by mouth daily.     . insulin glargine (LANTUS) 100 UNIT/ML injection Inject 24 Units into the skin at bedtime.    Marland Kitchen LUMIGAN 0.01 % SOLN Place 1 drop into both eyes at bedtime.     . rosuvastatin (CRESTOR) 40 MG tablet Take 40 mg by mouth daily.  0  . TRESIBA FLEXTOUCH 100 UNIT/ML FlexTouch Pen Inject 30 Units into the skin daily.    Alveda Reasons 20 MG TABS tablet Take 20 mg by mouth daily.     No current facility-administered medications for this visit.    PHYSICAL EXAMINATION: ECOG PERFORMANCE STATUS: 1 - Symptomatic but completely ambulatory  Vitals:   10/28/20 0937  BP: (!) 118/59  Pulse: 74  Resp: 17  Temp: (!) 97.3 F (36.3 C)  SpO2: 100%   Filed Weights   10/28/20 0937  Weight: 140 lb 12.8 oz (63.9 kg)    LABORATORY DATA:  I have reviewed the data as listed CMP Latest Ref Rng & Units 05/25/2018 03/31/2018 02/08/2018  Glucose 70 - 99 mg/dL 193(H) 374(H) 193(H)  BUN 8 - 23 mg/dL 18 19 21   Creatinine 0.44 - 1.00 mg/dL 0.99 0.99 0.90  Sodium 135 - 145 mmol/L 139 138 140  Potassium 3.5 - 5.1 mmol/L 4.2 4.4 4.0  Chloride 98 - 111 mmol/L 102 104 105  CO2 22 - 32 mmol/L 29 25 26   Calcium 8.9 - 10.3 mg/dL 10.0 9.6 9.5  Total Protein 6.4 - 8.3 g/dL - - 6.8  Total Bilirubin 0.2 - 1.2 mg/dL - - 0.5  Alkaline  Phos 40 - 150 U/L - - 47  AST 5 - 34 U/L - - 22  ALT 0 - 55 U/L - - 24    Lab Results  Component Value Date   WBC 5.4 05/25/2018   HGB 14.0 05/25/2018   HCT 43.7 05/25/2018   MCV 87.9 05/25/2018   PLT 172 05/25/2018   NEUTROABS 4.3 02/08/2018    ASSESSMENT & PLAN:  Ductal carcinoma in situ (DCIS) of left breast 02/27/2018:Left lumpectomy: DCIS with calcifications, intermediate grade, 0.6 cm, margins negative, ER 100%, PR 100%, Tis N0 stage 0  Treatment plan :  1.Adjuvant radiation therapy 05/02/2018-05/24/18 2.followed by adjuvant antiestrogen therapy withAnastrozole 1 mg dailydaily x5 years started June 20, 2018.  Letrozole  toxicities: Continues to have hot flashes that are mild to moderate. But she had those even before starting anastrozole.  Breast cancer surveillance: 1.  Breast exam could not be done 2.  Mammogram at [facility] June 2020: Benign density cat B  Bone density 08/22/2018: T score -1.2: Mild osteopenia She had another bone density with Dr.Russo this year  Return to clinic in 1 year for follow-up    No orders of the defined types were placed in this encounter.  The patient has a good understanding of the overall plan. she agrees with it. she will call with any problems that may develop before the next visit here.  Total time spent: 20 mins including face to face time and time spent for planning, charting and coordination of care  Rulon Eisenmenger, MD, MPH 10/28/2020  I, Cloyde Reams Dorshimer, am acting as scribe for Dr. Nicholas Lose.  I have reviewed the above documentation for accuracy and completeness, and I agree with the above.

## 2020-10-28 ENCOUNTER — Other Ambulatory Visit: Payer: Self-pay

## 2020-10-28 ENCOUNTER — Inpatient Hospital Stay: Payer: Medicare HMO | Attending: Hematology and Oncology | Admitting: Hematology and Oncology

## 2020-10-28 DIAGNOSIS — D0512 Intraductal carcinoma in situ of left breast: Secondary | ICD-10-CM | POA: Insufficient documentation

## 2020-10-28 DIAGNOSIS — Z79811 Long term (current) use of aromatase inhibitors: Secondary | ICD-10-CM | POA: Insufficient documentation

## 2020-10-28 DIAGNOSIS — Z923 Personal history of irradiation: Secondary | ICD-10-CM | POA: Insufficient documentation

## 2020-10-28 MED ORDER — ANASTROZOLE 1 MG PO TABS: 1.0000 mg | ORAL_TABLET | Freq: Every day | ORAL | 3 refills | Status: DC

## 2020-10-28 NOTE — Assessment & Plan Note (Signed)
02/27/2018:Left lumpectomy: DCIS with calcifications, intermediate grade, 0.6 cm, margins negative, ER 100%, PR 100%, Tis N0 stage 0 °  °Treatment plan :  °1.  Adjuvant radiation therapy  05/02/2018-05/24/18 °2. followed by adjuvant antiestrogen therapy with Anastrozole 1 mg daily daily x5 years started June 20, 2018. °  °Letrozole toxicities: Continues to have hot flashes that are mild to moderate. But she had those even before starting anastrozole. °  °Breast cancer surveillance: °1.  Breast exam could not be done °2.  Mammogram at [facility] June 2020: Benign density cat B °  °Bone density 08/22/2018: T score -1.2: Mild osteopenia °She had another bone density with Dr.Russo this year °  °Return to clinic in 1 year for follow-up °

## 2020-10-29 DIAGNOSIS — M65341 Trigger finger, right ring finger: Secondary | ICD-10-CM | POA: Diagnosis not present

## 2020-10-29 DIAGNOSIS — M65332 Trigger finger, left middle finger: Secondary | ICD-10-CM | POA: Diagnosis not present

## 2020-10-29 DIAGNOSIS — M65321 Trigger finger, right index finger: Secondary | ICD-10-CM | POA: Diagnosis not present

## 2020-10-30 ENCOUNTER — Encounter: Payer: Self-pay | Admitting: Nurse Practitioner

## 2020-10-30 ENCOUNTER — Other Ambulatory Visit: Payer: Self-pay

## 2020-10-30 ENCOUNTER — Ambulatory Visit: Payer: Medicare HMO | Admitting: Nurse Practitioner

## 2020-10-30 VITALS — BP 110/64 | HR 68 | Resp 16 | Wt 140.0 lb

## 2020-10-30 DIAGNOSIS — R319 Hematuria, unspecified: Secondary | ICD-10-CM

## 2020-10-30 DIAGNOSIS — R350 Frequency of micturition: Secondary | ICD-10-CM

## 2020-10-30 DIAGNOSIS — B379 Candidiasis, unspecified: Secondary | ICD-10-CM | POA: Diagnosis not present

## 2020-10-30 LAB — WET PREP FOR TRICH, YEAST, CLUE

## 2020-10-30 MED ORDER — FLUCONAZOLE 150 MG PO TABS: 150.0000 mg | ORAL_TABLET | Freq: Once | ORAL | 0 refills | Status: AC

## 2020-10-30 NOTE — Patient Instructions (Signed)
Urinary Frequency, Adult Urinary frequency means urinating more often than usual. You may urinate every 1-2 hours even though you drink a normal amount of fluid and do not have a bladder infection or condition. Although you urinate more often than normal,the total amount of urine produced in a day is normal. With urinary frequency, you may have an urgent need to urinate often. The stress and anxiety of needing to find a bathroom quickly can make this urge worse. This condition may go away on its own or you may need treatment at home. Home treatment may include bladder training, exercises, taking medicines, ormaking changes to your diet. Follow these instructions at home: Bladder health  Keep a bladder diary if told by your health care provider. Keep track of: What you eat and drink. How often you urinate. How much you urinate. Follow a bladder training program if told by your health care provider. This may include: Learning to delay going to the bathroom. Double urinating (voiding). This helps if you are not completely emptying your bladder. Scheduled voiding. Do Kegel exercises as told by your health care provider. Kegel exercises strengthen the muscles that help control urination, which may help the condition.  Eating and drinking If told by your health care provider, make diet changes, such as: Avoiding caffeine. Drinking fewer fluids, especially alcohol. Not drinking in the evening. Avoiding foods or drinks that may irritate the bladder. These include coffee, tea, soda, artificial sweeteners, citrus, tomato-based foods, and chocolate. Eating foods that help prevent or ease constipation. Constipation can make this condition worse. Your health care provider may recommend that you: Drink enough fluid to keep your urine pale yellow. Take over-the-counter or prescription medicines. Eat foods that are high in fiber, such as beans, whole grains, and fresh fruits and vegetables. Limit foods  that are high in fat and processed sugars, such as fried or sweet foods. General instructions Take over-the-counter and prescription medicines only as told by your health care provider. Keep all follow-up visits as told by your health care provider. This is important. Contact a health care provider if: You start urinating more often. You feel pain or irritation when you urinate. You notice blood in your urine. Your urine looks cloudy. You develop a fever. You begin vomiting. Get help right away if: You are unable to urinate. Summary Urinary frequency means urinating more often than usual. With urinary frequency, you may urinate every 1-2 hours even though you drink a normal amount of fluid and do not have a bladder infection or other bladder condition. Your health care provider may recommend that you keep a bladder diary, follow a bladder training program, or make dietary changes. If told by your health care provider, do Kegel exercises to strengthen the muscles that help control urination. Take over-the-counter and prescription medicines only as told by your health care provider. Contact a health care provider if your symptoms do not improve or get worse. This information is not intended to replace advice given to you by your health care provider. Make sure you discuss any questions you have with your healthcare provider. Document Revised: 03/16/2018 Document Reviewed: 03/16/2018 Elsevier Patient Education  2021 [employer]  

## 2020-10-30 NOTE — Progress Notes (Signed)
GYNECOLOGY  VISIT  CC:   Urinary Frequency  HPI: 80 y.o. G1P1 Widowed White or Caucasian female here for urinary frequency, trouble emptying bladder with one void.    Denies dysuria, denies problems with leaking urine, does not feel like retaining urine.  Feels like she urinates about 10 times per day, feels like she could urinate again 5 min after just urinating and it doesn't all come out. Has to sit on the toilet and voids about 3 times before she is finally done.  Urinates about 3 times at night.  In the past has asymptomatic urinary tract infections with out knowing but takes antibiotics and nothing changes.  Has had diabetes x 15 years.  [facility] for this same reason and felt dismissed  She is concerned that she may have cancer because she had cancer of the breast with out symptoms  GYNECOLOGIC HISTORY: No LMP recorded. Patient has had a hysterectomy. Contraception: hysterectomy Menopausal hormone therapy: none  Patient Active Problem List   Diagnosis Date Noted  . Posterior capsular opacification, left 09/24/2020  . Severe nonproliferative diabetic retinopathy of left eye without macular edema associated with type 2 diabetes mellitus ([place]) 05/12/2020  . Severe nonproliferative diabetic retinopathy of right eye, with macular edema, associated with type 2 diabetes mellitus ([place]) 05/12/2020  . Diarrhea 11/28/2019  . Persistent atrial fibrillation ([place])   . Ductal carcinoma in situ (DCIS) of left breast 02/06/2018  . Internal hemorrhoids 11/30/2011  . Rectal bleeding 11/30/2011    Past Medical History:  Diagnosis Date  . Cancer [facility]) 2019   left breast cancer, s/p XRT s/p lumpectomy  . Cataract    removed both eyes  . Diabetes mellitus   . Ductal carcinoma in situ (DCIS) of left breast 02/06/2018  . Glaucoma   . History of radiation therapy 05/02/18- 05/24/18   Left Breast 42.56 Gy in 16 fractions.   . Hyperlipidemia   . Internal  hemorrhoids 11/30/2011  . Longstanding persistent atrial fibrillation ([place])   . Posterior capsular opacification, right 05/12/2020   Recent YAG capsulotomy with Dr. Katy Apo, condition resolved  . Rectal bleeding 11/30/2011  . TIA (transient ischemic attack)    "years ago"    Past Surgical History:  Procedure Laterality Date  . ABDOMINAL HYSTERECTOMY  age 12  . BILATERAL SALPINGOOPHORECTOMY     By patient history  . BREAST BIOPSY    . BREAST LUMPECTOMY WITH RADIOACTIVE SEED LOCALIZATION Left 02/27/2018   Procedure: BREAST LUMPECTOMY WITH RADIOACTIVE SEED LOCALIZATION;  Surgeon: Alphonsa Overall, MD;  Location: [facility];  Service: General;  Laterality: Left;  . CARDIOVERSION N/A 05/31/2018   Procedure: CARDIOVERSION;  Surgeon: [name], MD;  Location: [facility] ENDOSCOPY;  Service: Cardiovascular;  Laterality: N/A;  . COLONOSCOPY      MEDS:   Current Outpatient Medications on File Prior to Visit  Medication Sig Dispense Refill  . anastrozole (ARIMIDEX) 1 MG tablet Take 1 tablet (1 mg total) by mouth daily. 90 tablet 3  . empagliflozin (JARDIANCE) 10 MG TABS tablet Take 10 mg by mouth daily.    Marland Kitchen gabapentin (NEURONTIN) 300 MG capsule Take 300 mg by mouth daily.     . insulin glargine (LANTUS) 100 UNIT/ML injection Inject 24 Units into the skin at bedtime.    Marland Kitchen LUMIGAN 0.01 % SOLN Place 1 drop into both eyes at bedtime.     . rosuvastatin (CRESTOR) 40 MG tablet Take 40 mg by mouth daily.  0  . XARELTO 20 MG TABS tablet Take 20 mg by mouth daily.     No current facility-administered medications on file prior to visit.    ALLERGIES: Patient has no known allergies.  Family History  Problem Relation Age of Onset  . Lung cancer Father   . Alzheimer's disease Mother   . Colon cancer Neg Hx   . Colon polyps Neg Hx   . Esophageal cancer Neg Hx   . Rectal cancer Neg Hx   . Stomach cancer Neg Hx    Review of Systems  Constitutional: Negative.   HENT: Negative.    Eyes: Negative.   Respiratory: Negative.   Cardiovascular: Negative.   Gastrointestinal: Negative.   Endocrine: Negative.   Genitourinary: Positive for frequency.       Trouble completely emptying bladder  Musculoskeletal: Negative.   Skin: Negative.   Allergic/Immunologic: Negative.   Neurological: Negative.   Hematological: Negative.   Psychiatric/Behavioral: Negative.     PHYSICAL EXAMINATION:    BP 110/64   Pulse 68   Resp 16   Wt 140 lb (63.5 kg)   BMI 24.03 kg/m     General appearance: alert, cooperative, no acute distress  Pelvic: External genitalia:  Thin tissue with redness, mild edema              Urethra:  normal appearing urethra with no masses, tenderness or lesions              Bartholins and Skenes: normal                 Vagina: normal appearing vagina, atrophy, no severe prolapse              Cervix: Absent               Chaperone, Joy, CMA, was present for exam.  Assessment/Plan: Urinary frequency - Plan: Urinalysis,Complete w/RFL Culture, fluconazole (DIFLUCAN) 150 MG tablet, WET PREP FOR TRICH, YEAST, CLUE  Yeast detected - Plan: fluconazole (DIFLUCAN) 150 MG tablet, WET PREP FOR TRICH, YEAST, CLUE  Hematuria, unspecified type Discussed that blood in urine is most likely result of yeast irritating the tissue. Discussed that bladder cancer is unlikely with out a lot more blood but can refer to different urologist for cystoscopy if she would like.  Will recheck urinalysis after treatment of yeast and retest for blood. F/u 2 weeks

## 2020-11-01 LAB — URINALYSIS, COMPLETE W/RFL CULTURE
Bilirubin Urine: NEGATIVE
Hyaline Cast: NONE SEEN /LPF
Leukocyte Esterase: NEGATIVE
Nitrites, Initial: NEGATIVE
Specific Gravity, Urine: 1.036 — ABNORMAL HIGH (ref 1.001–1.03)
pH: 5 (ref 5.0–8.0)

## 2020-11-01 LAB — URINE CULTURE
MICRO NUMBER:: 11519357
SPECIMEN QUALITY:: ADEQUATE

## 2020-11-01 LAB — CULTURE INDICATED

## 2020-11-02 NOTE — Progress Notes (Signed)
Please notify patient that as expected, her urine culture confirms that there is no bladder infection. Encourage her to keep her appointment as scheduled to repeat urinalysis after treating yeast.

## 2020-11-10 NOTE — Progress Notes (Signed)
GYNECOLOGY  VISIT  CC:  Follow up from 10/30/20 visit.  Recheck blood in urine specimen  HPI: 80 y.o. G1P1 Widowed White or Caucasian female here for follow up of yeast infection & recheck of urine for hematuria.  Pt does not have any symptoms for itching or dysuria. Still has the same problem with frequent urination. Denies vaginal/vulvar itching. See history obtained on 10/30/2020 visit.    GYNECOLOGIC HISTORY: No LMP recorded. Patient has had a hysterectomy. Contraception: hysterectomy  Menopausal hormone therapy: none  Patient Active Problem List   Diagnosis Date Noted  . Posterior capsular opacification, left 09/24/2020  . Severe nonproliferative diabetic retinopathy of left eye without macular edema associated with type 2 diabetes mellitus ([place]) 05/12/2020  . Severe nonproliferative diabetic retinopathy of right eye, with macular edema, associated with type 2 diabetes mellitus ([place]) 05/12/2020  . Diarrhea 11/28/2019  . Persistent atrial fibrillation ([place])   . Ductal carcinoma in situ (DCIS) of left breast 02/06/2018  . Internal hemorrhoids 11/30/2011  . Rectal bleeding 11/30/2011    Past Medical History:  Diagnosis Date  . Cancer [facility]) 2019   left breast cancer, s/p XRT s/p lumpectomy  . Cataract    removed both eyes  . Diabetes mellitus   . Ductal carcinoma in situ (DCIS) of left breast 02/06/2018  . Glaucoma   . History of radiation therapy 05/02/18- 05/24/18   Left Breast 42.56 Gy in 16 fractions.   . Hyperlipidemia   . Internal hemorrhoids 11/30/2011  . Longstanding persistent atrial fibrillation ([place])   . Posterior capsular opacification, right 05/12/2020   Recent YAG capsulotomy with Dr. Katy Apo, condition resolved  . Rectal bleeding 11/30/2011  . TIA (transient ischemic attack)    "years ago"    Past Surgical History:  Procedure Laterality Date  . ABDOMINAL HYSTERECTOMY  age 61  . BILATERAL SALPINGOOPHORECTOMY     By patient history  . BREAST BIOPSY     . BREAST LUMPECTOMY WITH RADIOACTIVE SEED LOCALIZATION Left 02/27/2018   Procedure: BREAST LUMPECTOMY WITH RADIOACTIVE SEED LOCALIZATION;  Surgeon: Alphonsa Overall, MD;  Location: [facility];  Service: General;  Laterality: Left;  . CARDIOVERSION N/A 05/31/2018   Procedure: CARDIOVERSION;  Surgeon: [name], MD;  Location: [facility] ENDOSCOPY;  Service: Cardiovascular;  Laterality: N/A;  . COLONOSCOPY      MEDS:   Current Outpatient Medications on File Prior to Visit  Medication Sig Dispense Refill  . anastrozole (ARIMIDEX) 1 MG tablet Take 1 tablet (1 mg total) by mouth daily. 90 tablet 3  . empagliflozin (JARDIANCE) 10 MG TABS tablet Take 10 mg by mouth daily.    Marland Kitchen gabapentin (NEURONTIN) 300 MG capsule Take 300 mg by mouth daily.     . insulin glargine (LANTUS) 100 UNIT/ML injection Inject 24 Units into the skin at bedtime.    Marland Kitchen LUMIGAN 0.01 % SOLN Place 1 drop into both eyes at bedtime.     . rosuvastatin (CRESTOR) 40 MG tablet Take 40 mg by mouth daily.  0  . XARELTO 20 MG TABS tablet Take 20 mg by mouth daily.     No current facility-administered medications on file prior to visit.    ALLERGIES: Patient has no known allergies.  Family History  Problem Relation Age of Onset  . Lung cancer Father   . Alzheimer's disease Mother   . Colon cancer Neg Hx   . Colon polyps Neg Hx   . Esophageal cancer Neg Hx   . Rectal cancer Neg  Hx   . Stomach cancer Neg Hx     Review of Systems  Constitutional: Negative.   HENT: Negative.   Eyes: Negative.   Respiratory: Negative.   Cardiovascular: Negative.   Gastrointestinal: Negative.   Endocrine: Negative.   Genitourinary: Negative.   Musculoskeletal: Negative.   Skin: Negative.   Allergic/Immunologic: Negative.   Neurological: Negative.   Hematological: Negative.   Psychiatric/Behavioral: Negative.     PHYSICAL EXAMINATION:    BP 118/64   Pulse 68   Resp 16   Wt 139 lb (63 kg)   BMI 23.86 kg/m     General  appearance: alert, cooperative, no acute distress   Assessment: Hematuria, unspecified type - Plan: Urinalysis,Complete w/RFL Culture 1+ blood noted with urinalysis on 10/30/2020, urine culture neg 2+ blood noted with urinalysis today  Yeast UTI - Plan: fluconazole (DIFLUCAN) 150 MG tablet  Plan: Although not symptomatic for yeast, will treat again prior to upcoming urology appointment.   Pt is very concerned about blood in urine and would like to be evaluated for bladder cancer. Also has problems with urinary frequency. Will refer to urology.

## 2020-11-11 ENCOUNTER — Ambulatory Visit: Payer: Medicare HMO | Admitting: Nurse Practitioner

## 2020-11-11 ENCOUNTER — Encounter: Payer: Self-pay | Admitting: Nurse Practitioner

## 2020-11-11 ENCOUNTER — Other Ambulatory Visit: Payer: Self-pay

## 2020-11-11 VITALS — BP 118/64 | HR 68 | Resp 16 | Wt 139.0 lb

## 2020-11-11 DIAGNOSIS — R319 Hematuria, unspecified: Secondary | ICD-10-CM

## 2020-11-11 DIAGNOSIS — B3749 Other urogenital candidiasis: Secondary | ICD-10-CM

## 2020-11-11 MED ORDER — FLUCONAZOLE 150 MG PO TABS: 150.0000 mg | ORAL_TABLET | Freq: Once | ORAL | 0 refills | Status: AC

## 2020-11-11 NOTE — Patient Instructions (Signed)

## 2020-11-13 LAB — URINALYSIS, COMPLETE W/RFL CULTURE
Bilirubin Urine: NEGATIVE
Hyaline Cast: NONE SEEN /LPF
Ketones, ur: NEGATIVE
Leukocyte Esterase: NEGATIVE
Nitrites, Initial: NEGATIVE
Specific Gravity, Urine: 1.02 (ref 1.001–1.03)
pH: 5 (ref 5.0–8.0)

## 2020-11-13 LAB — URINE CULTURE
MICRO NUMBER:: 11563914
SPECIMEN QUALITY:: ADEQUATE

## 2020-11-13 LAB — CULTURE INDICATED

## 2020-11-21 DIAGNOSIS — I4892 Unspecified atrial flutter: Secondary | ICD-10-CM | POA: Diagnosis not present

## 2020-11-21 DIAGNOSIS — Z794 Long term (current) use of insulin: Secondary | ICD-10-CM | POA: Diagnosis not present

## 2020-11-21 DIAGNOSIS — E1165 Type 2 diabetes mellitus with hyperglycemia: Secondary | ICD-10-CM | POA: Diagnosis not present

## 2020-11-21 DIAGNOSIS — Z8673 Personal history of transient ischemic attack (TIA), and cerebral infarction without residual deficits: Secondary | ICD-10-CM | POA: Diagnosis not present

## 2020-11-21 DIAGNOSIS — Z7901 Long term (current) use of anticoagulants: Secondary | ICD-10-CM | POA: Diagnosis not present

## 2020-11-21 DIAGNOSIS — I10 Essential (primary) hypertension: Secondary | ICD-10-CM | POA: Diagnosis not present

## 2020-11-21 DIAGNOSIS — E1140 Type 2 diabetes mellitus with diabetic neuropathy, unspecified: Secondary | ICD-10-CM | POA: Diagnosis not present

## 2020-11-21 DIAGNOSIS — E11319 Type 2 diabetes mellitus with unspecified diabetic retinopathy without macular edema: Secondary | ICD-10-CM | POA: Diagnosis not present

## 2020-11-21 DIAGNOSIS — R011 Cardiac murmur, unspecified: Secondary | ICD-10-CM | POA: Diagnosis not present

## 2020-11-21 DIAGNOSIS — M6530 Trigger finger, unspecified finger: Secondary | ICD-10-CM | POA: Diagnosis not present

## 2020-11-27 ENCOUNTER — Ambulatory Visit (HOSPITAL_COMMUNITY): Payer: Medicare HMO | Admitting: Nurse Practitioner

## 2020-12-03 DIAGNOSIS — H401131 Primary open-angle glaucoma, bilateral, mild stage: Secondary | ICD-10-CM | POA: Diagnosis not present

## 2020-12-11 DIAGNOSIS — R82998 Other abnormal findings in urine: Secondary | ICD-10-CM | POA: Diagnosis not present

## 2020-12-17 NOTE — Progress Notes (Addendum)
[facility] New Patient Evaluation and Consultation  Referring Provider: Karma Ganja, NP PCP: Shon Baton, MD Date of Service: 12/19/2020  SUBJECTIVE Chief Complaint: New Patient (Initial Visit) (Referral recurrent utis; )  History of Present Illness: Dorothy Herring is a 80 y.o. White or Caucasian female seen in consultation at the request of NP Karma Ganja for evaluation of urinary frequency and hematuria.    Review of records significant for: Urinating frequently throughout the day and night. Urinalysis on 10/30/20 showed 0-2 RBC and urine culture showed mixed genital flora. Repeat urine culture on 11/11/20 showed <10,000 CFU. Repeat urinalysis on 2/22 showed 3-10 RBC. At that time she was treated with fluconazole for a vaginal infection.   Urinary Symptoms: Does not leak urine.   Day time voids 10.  Nocturia: 4 times per night to void. Frequency has been going on about 6 months.  Voiding dysfunction: she does not empty her bladder well.  does not use a catheter to empty bladder.  When urinating, she feels difficulty starting urine stream and dribbling after finishing Drinks: 2 cups coffee, 1 diet coke caffeine free, 4- 16oz bottles water per day Drinks about 16 oz before going to sleep.   UTIs: unsure Has taken fluconazole,  Reports history of blood in urine  Pelvic Organ Prolapse Symptoms:                  She Denies a feeling of a bulge the vaginal area.   Bowel Symptom: Bowel movements: 2-3 time(s) per day Stool consistency: hard or soft  Straining: no.  Splinting: no.  Incomplete evacuation: no.  She Admits to accidental bowel leakage / fecal incontinence- unable to get to the bathroom when she has the urge to go  Occurs: 1 time(s) per week or once every two weeks. Has been happening for about a year.   Consistency with leakage: loose  Had seen GI for frequent diarrhea- recommended benefiber for stool bulking, daily probiotic. Has not used these. She  believes she has used imodium in the past.  Bowel regimen: none Last colonoscopy: Date 03/2019, Results: several polyps removed, no evidence of IBD or celiac  Sexual Function Sexually active: no.   Pelvic Pain Denies pelvic pain   Past Medical History:  Past Medical History:  Diagnosis Date  . Cancer [facility]) 2019   left breast cancer, s/p XRT s/p lumpectomy  . Cataract    removed both eyes  . Diabetes mellitus   . Ductal carcinoma in situ (DCIS) of left breast 02/06/2018  . Glaucoma   . History of radiation therapy 05/02/18- 05/24/18   Left Breast 42.56 Gy in 16 fractions.   . Hyperlipidemia   . Internal hemorrhoids 11/30/2011  . Longstanding persistent atrial fibrillation ([place])   . Posterior capsular opacification, right 05/12/2020   Recent YAG capsulotomy with Dr. Katy Apo, condition resolved  . Rectal bleeding 11/30/2011  . TIA (transient ischemic attack)    "years ago"     Past Surgical History:   Past Surgical History:  Procedure Laterality Date  . ABDOMINAL HYSTERECTOMY  age 42  . BILATERAL SALPINGOOPHORECTOMY     By patient history  . BREAST BIOPSY    . BREAST LUMPECTOMY WITH RADIOACTIVE SEED LOCALIZATION Left 02/27/2018   Procedure: BREAST LUMPECTOMY WITH RADIOACTIVE SEED LOCALIZATION;  Surgeon: Alphonsa Overall, MD;  Location: [facility];  Service: General;  Laterality: Left;  . CARDIOVERSION N/A 05/31/2018   Procedure: CARDIOVERSION;  Surgeon: [name], MD;  Location:  [facility] ENDOSCOPY;  Service: Cardiovascular;  Laterality: N/A;  . COLONOSCOPY       Past OB/GYN History: OB History  Gravida Para Term Preterm AB Living  1 1       1   SAB IAB Ectopic Multiple Live Births               # Outcome Date GA Lbr Len/2nd Weight Sex Delivery Anes PTL Lv  1 Para            NSVD x1 S/p hysterectomy   Medications: She has a current medication list which includes the following prescription(s): anastrozole, empagliflozin, gabapentin, insulin glargine,  lumigan, rosuvastatin, and xarelto.   Allergies: Patient has No Known Allergies.   Social History:  Social History   Tobacco Use  . Smoking status: Never Smoker  . Smokeless tobacco: Never Used  Vaping Use  . Vaping Use: Never used  Substance Use Topics  . Alcohol use: Not Currently  . Drug use: No    Relationship status: single She lives alone.   She is not employed. Regular exercise: Yes: walking, yard work  Family History:   Family History  Problem Relation Age of Onset  . Lung cancer Father   . Alzheimer's disease Mother   . Colon cancer Neg Hx   . Colon polyps Neg Hx   . Esophageal cancer Neg Hx   . Rectal cancer Neg Hx   . Stomach cancer Neg Hx      Review of Systems: Review of Systems  Constitutional: Negative for fever, malaise/fatigue and weight loss.  Respiratory: Negative for cough, shortness of breath and wheezing.   Cardiovascular: Negative for chest pain, palpitations and leg swelling.  Gastrointestinal: Negative for abdominal pain and blood in stool.  Genitourinary: Negative for dysuria.  Musculoskeletal: Negative for myalgias.  Skin: Negative for rash.  Neurological: Negative for dizziness and headaches.  Endo/Heme/Allergies: Does not bruise/bleed easily.       + hot flashes  Psychiatric/Behavioral: Negative for depression. The patient is not nervous/anxious.     OBJECTIVE Physical Exam: Vitals:   12/19/20 0944  BP: 106/71  Pulse: 85  Weight: 137 lb 8 oz (62.4 kg)  Height: 5\' 4"  (1.626 m)    Physical Exam Constitutional:      General: She is not in acute distress. Pulmonary:     Effort: Pulmonary effort is normal.  Abdominal:     General: There is no distension.     Palpations: Abdomen is soft.     Tenderness: There is no abdominal tenderness. There is no rebound.  Musculoskeletal:        General: No swelling. Normal range of motion.  Skin:    General: Skin is warm and dry.     Findings: No rash.  Neurological:     Mental  Status: She is alert and oriented to person, place, and time.  Psychiatric:        Mood and Affect: Mood normal.        Behavior: Behavior normal.      GU / Detailed Urogynecologic Evaluation:  Pelvic Exam: Normal external female genitalia; Bartholin's and Skene's glands normal in appearance; urethral meatus normal in appearance, no urethral masses or discharge.   CST: negative  s/p hysterectomy: Speculum exam reveals normal vaginal mucosa with  atrophy and normal vaginal cuff.  Adnexa no mass, fullness, tenderness.     Pelvic floor strength I/V, puborectalis II/V external anal sphincter II/V  Pelvic floor musculature: Right levator non-tender, Right obturator  non-tender, Left levator non-tender, Left obturator non-tender  POP-Q:  POP-Q  -2                                            Aa   -2                                           Ba  -7.5                                              C   3                                            Gh  4                                            Pb  8                                            tvl   -2                                            Ap  -2                                            Bp                                                 D     Rectal Exam:  Normal sphincter tone, no distal rectocele, enterocoele not present, no rectal masses, no sign of dyssynergia when asking the patient to bear down.  Post-Void Residual (PVR) by Bladder Scan: In order to evaluate bladder emptying, we discussed obtaining a postvoid residual and she agreed to this procedure.  Procedure: The ultrasound unit was placed on the patient's abdomen in the suprapubic region after the patient had voided. A PVR of 35 ml was obtained by bladder scan.  Laboratory Results: POC urine: + glucose, + protein, small leukocytes, 2+ blood, negative nitrites I visualized the urine specimen, noting the specimen to be clear yellow  ASSESSMENT AND PLAN Ms.  Wilmore is a 80 y.o. with:  1. Urinary frequency   2. Hematuria, unspecified type   3. Incontinence of feces, unspecified fecal incontinence type    1. Urinary frequency/ OAB We discussed the symptoms of overactive bladder (OAB), which include urinary urgency, urinary frequency, nocturia, with or  without urge incontinence.  While we do not know the exact etiology of OAB, several treatment options exist. We discussed management including behavioral therapy (decreasing bladder irritants, urge suppression strategies, timed voids, bladder retraining), physical therapy, medication; for refractory cases posterior tibial nerve stimulation, sacral neuromodulation, and intravesical botulinum toxin injection.  - She prefers not to start a medication at this time. She will work on decreasing bladder irritants, such as coffee and soda.   2. Hematuria - Unclear if prior hematuria was noted at the time of urinary tract infections. She reports taking fluconazole but also that she has had several UTIs within the last few months.  - We will also obtain old urine culture records from PCP. There is some association with rUTI and empagliflozin which she is on for her DM.  - Will send urine today for microscopic UA and urine culture. If microscopic hematuria is confirmed, we discussed the importance of work-up including assessing the upper and lower GU tract with CT urogram and cystoscopy.    3. Accidental Bowel Leakage - Treatment options include anti-diarrhea medication (loperamide/ Imodium OTC or prescription lomotil), fiber supplements, physical therapy, and possible sacral neuromodulation or surgery.   - She is not interested in pelvic floor physical therapy at this times. She will start with psyllium fiber supplement to see if this improves her stool consistency.  - We also discussed SNM if she fails more conservative therapies, as this can be beneficial for both OAB and ABL.   Will follow up after results of  UA/ culture  Jaquita Folds, MD   Medical Decision Making:   - Reviewed/ ordered a clinical laboratory test - Decision to obtain old records - Review and summation of prior records

## 2020-12-19 ENCOUNTER — Other Ambulatory Visit: Payer: Self-pay

## 2020-12-19 ENCOUNTER — Ambulatory Visit (INDEPENDENT_AMBULATORY_CARE_PROVIDER_SITE_OTHER): Payer: Medicare HMO | Admitting: Obstetrics and Gynecology

## 2020-12-19 ENCOUNTER — Telehealth: Payer: Self-pay

## 2020-12-19 ENCOUNTER — Encounter: Payer: Self-pay | Admitting: Obstetrics and Gynecology

## 2020-12-19 VITALS — BP 106/71 | HR 85 | Ht 64.0 in | Wt 137.5 lb

## 2020-12-19 DIAGNOSIS — R319 Hematuria, unspecified: Secondary | ICD-10-CM | POA: Diagnosis not present

## 2020-12-19 DIAGNOSIS — R350 Frequency of micturition: Secondary | ICD-10-CM | POA: Diagnosis not present

## 2020-12-19 DIAGNOSIS — R159 Full incontinence of feces: Secondary | ICD-10-CM | POA: Diagnosis not present

## 2020-12-19 LAB — POCT URINALYSIS DIPSTICK
Appearance: NORMAL
Bilirubin, UA: NEGATIVE
Glucose, UA: POSITIVE — AB
Nitrite, UA: NEGATIVE
Protein, UA: POSITIVE — AB
Spec Grav, UA: 1.02 (ref 1.010–1.025)
Urobilinogen, UA: 0.2 E.U./dL
pH, UA: 5.5 (ref 5.0–8.0)

## 2020-12-19 NOTE — Patient Instructions (Addendum)
Accidental Bowel Leakage: Our goal is to achieve formed bowel movements daily or every-other-day without leakage.  You may need to try different combinations of the following options to find what works best for you.  Some management options include: Marland Kitchen Dietary changes (more leafy greens, vegetables and fruits; less processed foods) . Fiber supplementation (Metamucil or something with psyllium as active ingredient) . Over-the-counter imodium (tablets or liquid) to help solidify the stool and prevent leakage of stool.    Today we talked about ways to manage bladder urgency such as altering your diet to avoid irritative beverages and foods (bladder diet) as well as attempting to decrease stress and other exacerbating factors.  Try to avoid drinking at least 2 hours prior to bedtime.   The Most Bothersome Foods* The Least Bothersome Foods*  Coffee - Regular & Decaf Tea - caffeinated Carbonated beverages - cola, non-colas, diet & caffeine-free Alcohols - Beer, Red Wine, White Wine, Champagne Fruits - Grapefruit, [place], Orange, [employer] - Cranberry, Grapefruit, Orange, Pineapple Vegetables - Tomato & Tomato Products Flavor Enhancers - Hot peppers, Spicy foods, Chili, Horseradish, Vinegar, Monosodium glutamate (MSG) Artificial Sweeteners - NutraSweet, Sweet 'N Low, Equal (sweetener), Saccharin Ethnic foods - [place], [place], [place] food [employer] Scripts - low-fat & whole Fruits - Bananas, Blueberries, Honeydew melon, Pears, Raisins, Watermelon Vegetables - Broccoli, Brussels Sprouts, [place], Carrots, Cauliflower, [employer], Cucumber, Mushrooms, Peas, Radishes, Squash, Zucchini, White potatoes, Sweet potatoes & yams Poultry - Chicken, Eggs, [place], [employer] - Beef, Programmer, multimedia, Lamb Seafood - Shrimp, [place] fish, Salmon Grains - Oat, Rice Snacks - Pretzels, Popcorn  *Lissa Morales et al. Diet and its role in interstitial cystitis/bladder pain syndrome (IC/BPS) and comorbid conditions. Industry 2012 Jan 11.   Microscopic hematuria (blood in the urine that is too small to see): We have sent your urine for official urinalysis. If there is an elevated level of blood we will contact you and we may recommend a blood test, an office cystoscopy (thin telescope to look inside the bladder) and kidney imaging (CT scan or ultrasound).

## 2020-12-19 NOTE — Telephone Encounter (Signed)
Attempt made to contact the pcp of NOHEMY KOOP is a 80 y.o. female Dr. Virgina Jock. The MA was not available. LM on the VM for Gail to call me back and or fax the urine cultures for the past 1 year.

## 2020-12-19 NOTE — Addendum Note (Signed)
Addended by: Jaquita Folds on: 12/19/2020 11:58 AM   Modules accepted: Level of Service

## 2020-12-21 LAB — URINE CULTURE

## 2020-12-24 ENCOUNTER — Telehealth: Payer: Self-pay | Admitting: Obstetrics and Gynecology

## 2020-12-24 ENCOUNTER — Encounter: Payer: Self-pay | Admitting: Obstetrics and Gynecology

## 2020-12-24 DIAGNOSIS — R3129 Other microscopic hematuria: Secondary | ICD-10-CM

## 2020-12-24 LAB — SPECIMEN STATUS REPORT

## 2020-12-24 NOTE — Telephone Encounter (Signed)
Received records from PCP office which does not show any recent urinary tract infections. Last micro UA showed 3-10 RBC/ hpf. Per AUA guidelines, will need workup with CT scan and cystoscopy to assess GU tract. Discussed these tests with her.

## 2020-12-26 ENCOUNTER — Ambulatory Visit: Payer: Medicare HMO | Admitting: Obstetrics and Gynecology

## 2021-01-09 ENCOUNTER — Encounter (HOSPITAL_COMMUNITY): Payer: Self-pay

## 2021-01-09 ENCOUNTER — Other Ambulatory Visit: Payer: Self-pay

## 2021-01-09 ENCOUNTER — Ambulatory Visit (HOSPITAL_COMMUNITY)
Admission: RE | Admit: 2021-01-09 | Discharge: 2021-01-09 | Disposition: A | Discharge status: Home / Self Care | Payer: Medicare HMO | Source: Ambulatory Visit | Attending: Obstetrics and Gynecology | Admitting: Obstetrics and Gynecology

## 2021-01-09 DIAGNOSIS — R3129 Other microscopic hematuria: Secondary | ICD-10-CM | POA: Insufficient documentation

## 2021-01-09 DIAGNOSIS — K8020 Calculus of gallbladder without cholecystitis without obstruction: Secondary | ICD-10-CM | POA: Diagnosis not present

## 2021-01-09 DIAGNOSIS — C50912 Malignant neoplasm of unspecified site of left female breast: Secondary | ICD-10-CM | POA: Diagnosis not present

## 2021-01-09 DIAGNOSIS — R197 Diarrhea, unspecified: Secondary | ICD-10-CM | POA: Diagnosis not present

## 2021-01-09 LAB — POCT I-STAT CREATININE: Creatinine, Ser: 0.9 mg/dL (ref 0.44–1.00)

## 2021-01-09 IMAGING — CT CT ABD-PEL WO/W CM
4 of 12 series · 14 of 46 positions shown, 18 images · IV contrast (omnipaque)
Comparison: [DATE] abdominal radiograph.

CLINICAL DATA: Microhematuria. Left breast cancer diagnosed [ZX]
status post radiation therapy with ongoing oral chemotherapy. Prior
hysterectomy. Diarrhea.

EXAM:
CT ABDOMEN AND PELVIS WITHOUT AND WITH CONTRAST
TECHNIQUE: Multidetector CT imaging of the abdomen and pelvis was performed
following the standard protocol before and following the bolus
administration of intravenous contrast.
CONTRAST:  100mL OMNIPAQUE IOHEXOL 300 MG/ML  SOLN

[Series 5: coronal pre · coronal · non-contrast · 0.76mm/px · 2 of 83 slices shown, 3 images]
[im 28/83  soft-tissue]
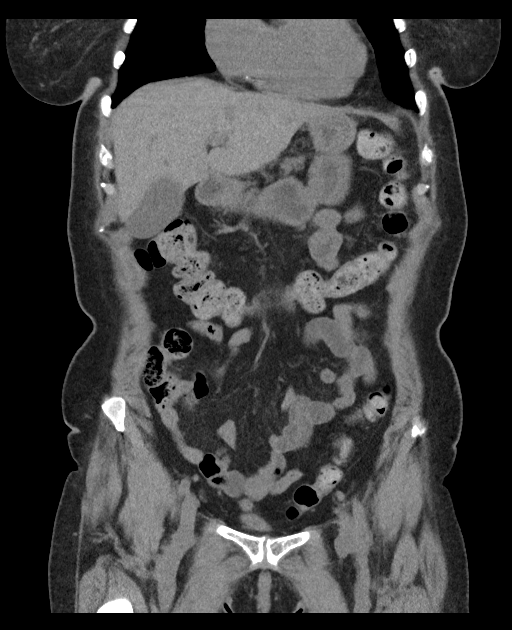
[im 28/83  bone]
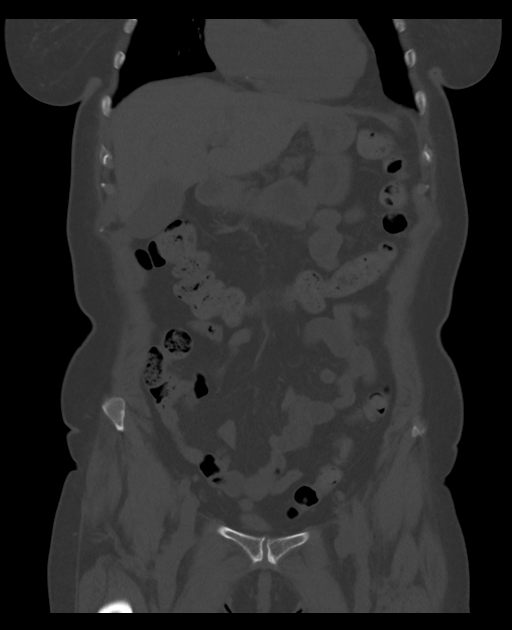
[im 55/83  soft-tissue]
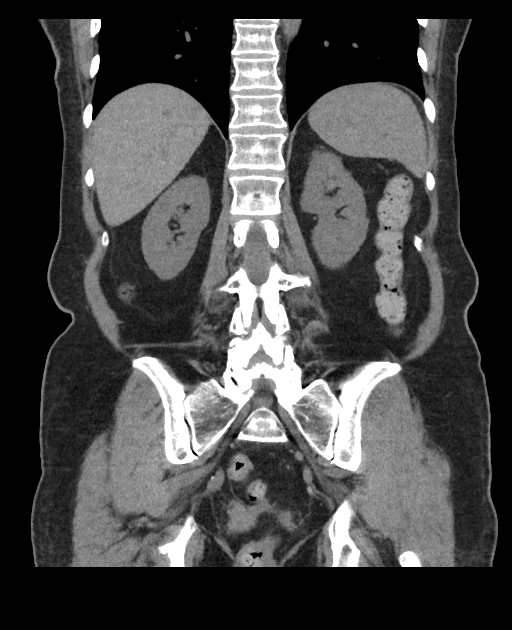

[Series 7: axial post · axial · 0.69mm/px · z∈[-322,-172]mm · 2 of 91 slices shown]
[im 31/91  soft-tissue]
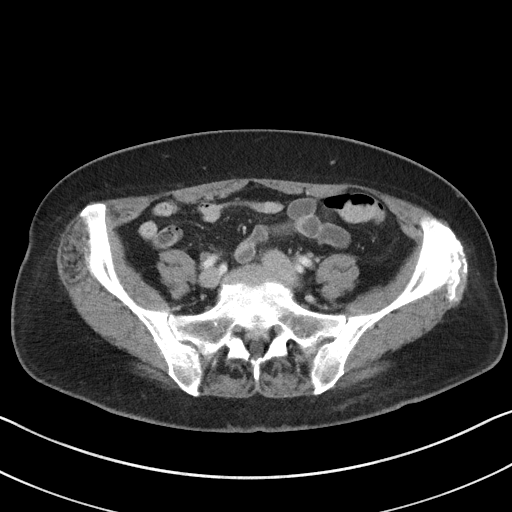
[im 61/91  soft-tissue]
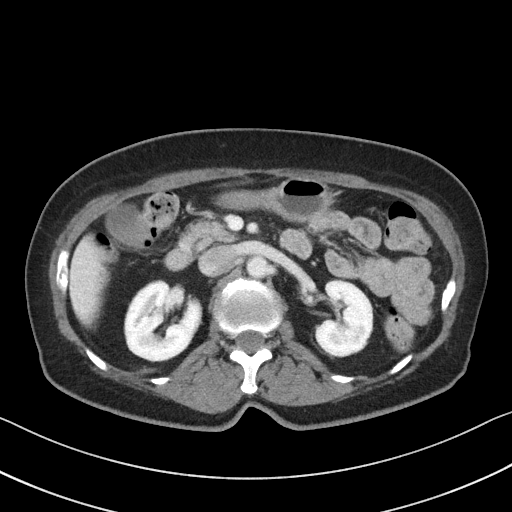

[Series 13: axial delay · axial · delayed · 0.75mm/px · z∈[-363,-208]mm · 2 of 95 slices shown, 5 images]
[im 32/95  soft-tissue]
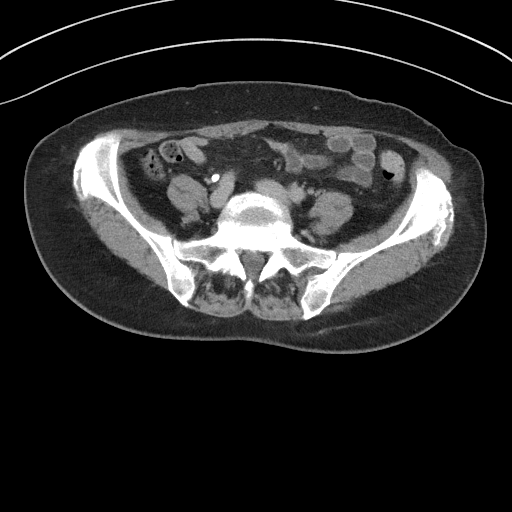
[im 32/95  lung]
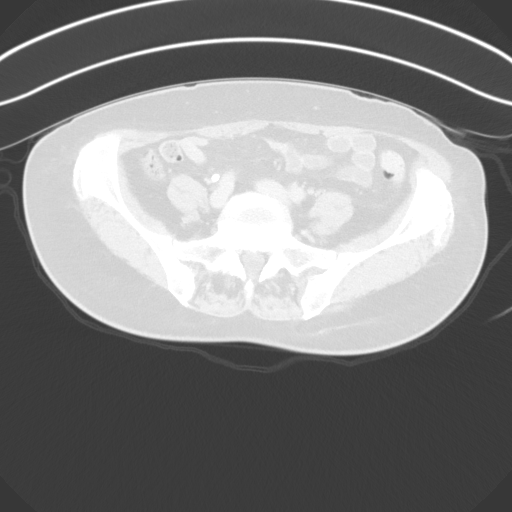
[im 32/95  bone]
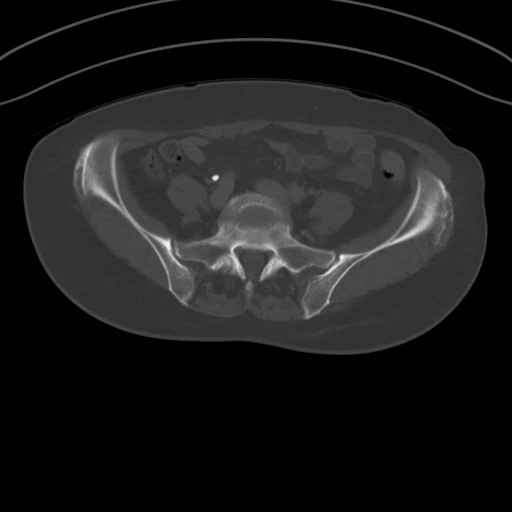
[im 63/95  soft-tissue]
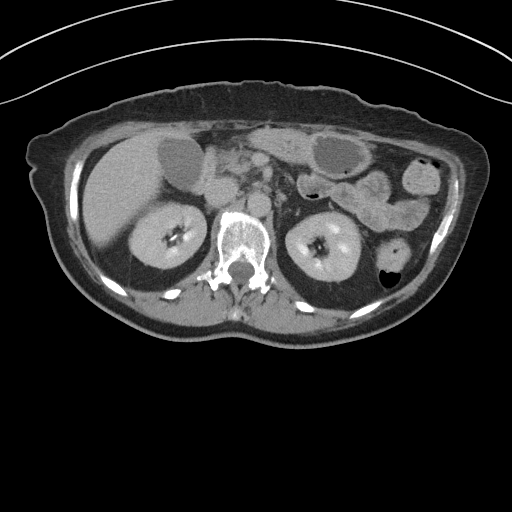
[im 63/95  lung]
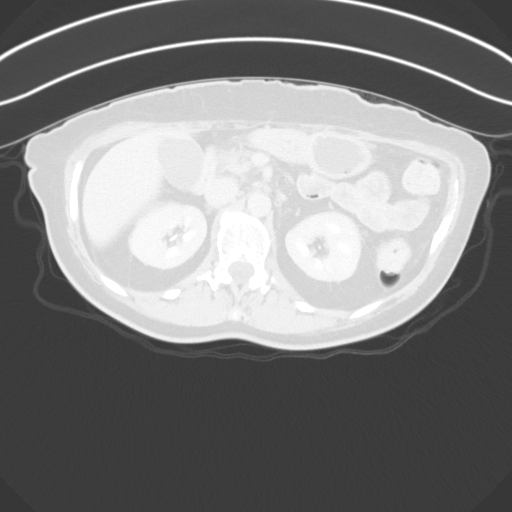

[Series 15: lung delay · axial · delayed · 0.75mm/px · z∈[-470,-102]mm · 8 of 238 slices shown]
[im 27/238  bone]
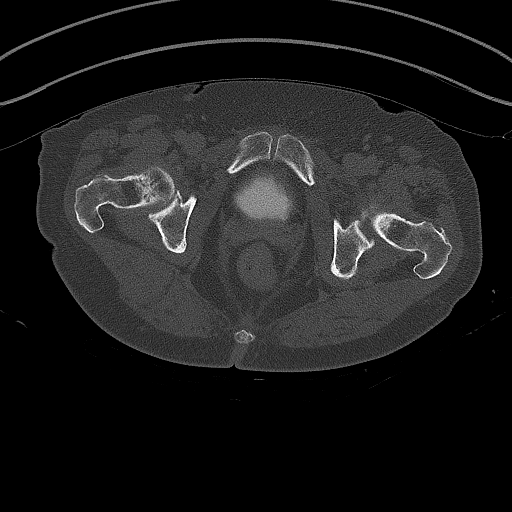
[im 53/238  bone]
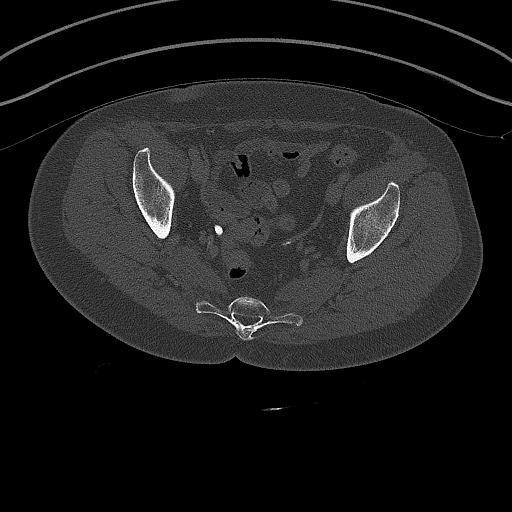
[im 80/238  bone]
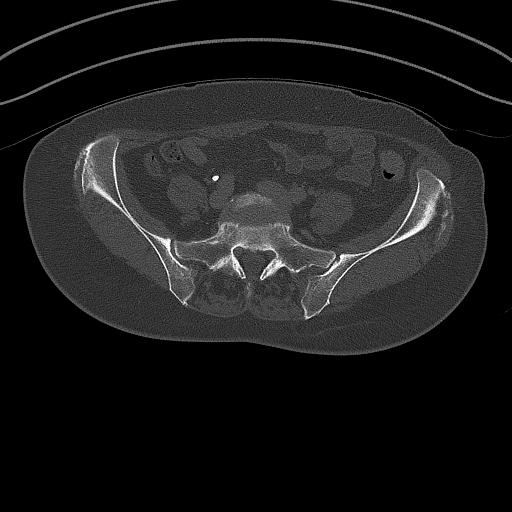
[im 106/238  bone]
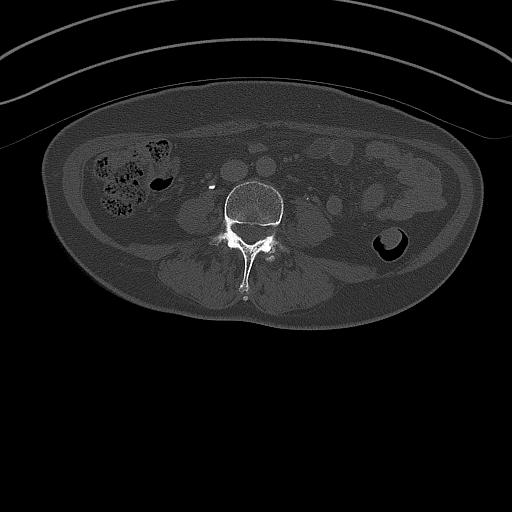
[im 132/238  bone]
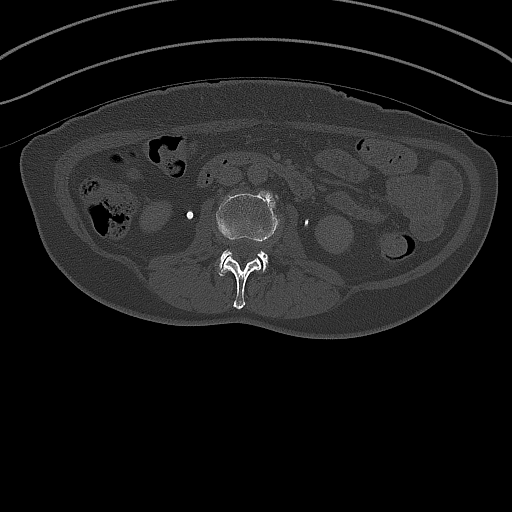
[im 159/238  bone]
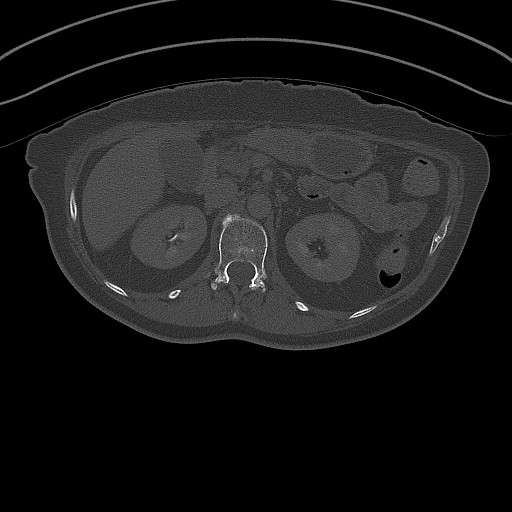
[im 185/238  bone]
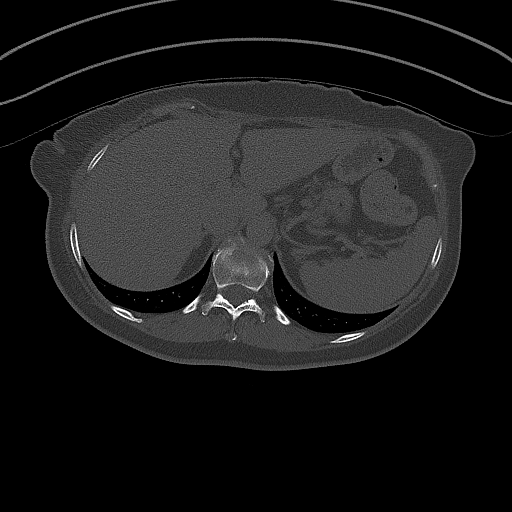
[im 211/238  bone]
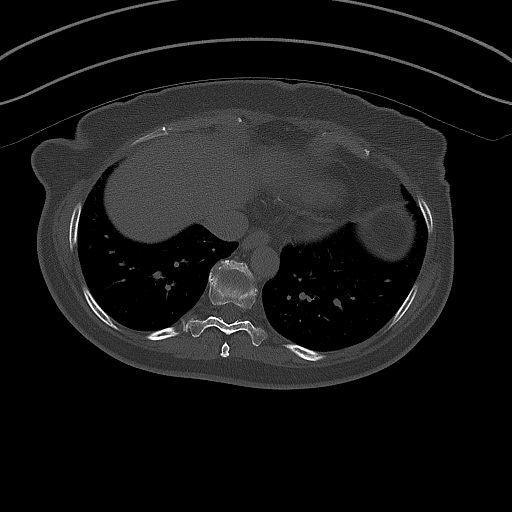

[14 of 46 positions shown; findings below may reference images not displayed]

FINDINGS: Lower chest: No significant pulmonary nodules or acute consolidative
airspace disease. Coronary atherosclerosis.

Hepatobiliary: Normal liver size. No liver mass. Tiny layering
gallstones in the otherwise normal gallbladder. No biliary ductal
dilatation.

Pancreas: Normal, with no mass or duct dilation.

Spleen: Normal size. No mass.

Adrenals/Urinary Tract: Normal adrenals. No renal stones. No
hydronephrosis. Subcentimeter hypodense posterior lower right renal
cortical lesion is too small to characterize and requires no
follow-up. No suspicious renal cortical masses. Normal caliber
ureters. No ureteral stones. On delayed imaging, there is no
urothelial wall thickening and there are no filling defects in the
opacified portions of the bilateral collecting systems or ureters,
noting limited visualization of the pelvic and lower lumbar left
ureter due to poor opacification. No bladder stones, wall
thickening, masses or diverticula.

Stomach/Bowel: Normal non-distended stomach. Normal caliber small
bowel with no small bowel wall thickening. Normal appendix. Normal
large bowel with no diverticulosis, large bowel wall thickening or
pericolonic fat stranding.

Vascular/Lymphatic: Normal caliber abdominal aorta. Patent portal,
splenic, hepatic and renal veins. No pathologically enlarged lymph
nodes in the abdomen or pelvis.

Reproductive: Status post hysterectomy, with no abnormal findings at
the vaginal cuff. No adnexal mass.

Other: No pneumoperitoneum, ascites or focal fluid collection.

Musculoskeletal: No aggressive appearing focal osseous lesions. Mild
thoracolumbar spondylosis.
IMPRESSION: 1. No urolithiasis. No hydronephrosis. No suspicious renal cortical
masses. No evidence of urothelial lesions, with limitations as
described.
2. Cholelithiasis.
3. Coronary atherosclerosis.

## 2021-01-09 MED ORDER — SODIUM CHLORIDE 0.9 % IV SOLN
INTRAVENOUS | Status: AC
  Filled 2021-01-09: qty 250

## 2021-01-09 MED ORDER — IOHEXOL 300 MG/ML  SOLN
100.0000 mL | Freq: Once | INTRAMUSCULAR | Status: AC | PRN
  Administered 2021-01-09: 100 mL via INTRAVENOUS

## 2021-01-14 ENCOUNTER — Encounter: Payer: Self-pay | Admitting: Obstetrics and Gynecology

## 2021-01-14 ENCOUNTER — Other Ambulatory Visit: Payer: Self-pay

## 2021-01-14 ENCOUNTER — Ambulatory Visit (INDEPENDENT_AMBULATORY_CARE_PROVIDER_SITE_OTHER): Payer: Medicare HMO | Admitting: Obstetrics and Gynecology

## 2021-01-14 VITALS — BP 127/77 | HR 74

## 2021-01-14 DIAGNOSIS — R350 Frequency of micturition: Secondary | ICD-10-CM | POA: Diagnosis not present

## 2021-01-14 DIAGNOSIS — R3129 Other microscopic hematuria: Secondary | ICD-10-CM | POA: Diagnosis not present

## 2021-01-14 LAB — POCT URINALYSIS DIPSTICK
Appearance: NORMAL
Bilirubin, UA: NEGATIVE
Blood, UA: NEGATIVE
Glucose, UA: POSITIVE — AB
Ketones, UA: NEGATIVE
Leukocytes, UA: NEGATIVE
Nitrite, UA: NEGATIVE
Protein, UA: POSITIVE — AB
Spec Grav, UA: 1.025 (ref 1.010–1.025)
Urobilinogen, UA: 0.2 E.U./dL
pH, UA: 5 (ref 5.0–8.0)

## 2021-01-14 MED ORDER — LIDOCAINE HCL URETHRAL/MUCOSAL 2 % EX GEL
1.0000 "application " | Freq: Once | CUTANEOUS | Status: AC
  Administered 2021-01-14: 1 via URETHRAL

## 2021-01-14 NOTE — Patient Instructions (Signed)
Taking Care of Yourself after Urodynamics, Cystoscopy, Coaptite Injection, or Botox Injection   Drink plenty of water for a day or two following your procedure. Try to have about 8 ounces (one cup) at a time, and do this 6 times or more per day unless you have fluid restrictitons AVOID irritative beverages such as coffee, tea, soda, alcoholic or citrus drinks for a day or two, as this may cause burning with urination.  For the first 1-2 days after the procedure, your urine may be pink or red in color. You may have some blood in your urine as a normal side effect of the procedure. Large amounts of bleeding or difficulty urinating are NOT normal. Call the nurse line if this happens or go to the nearest Emergency Room if the bleeding is heavy or you cannot urinate at all and it is after hours.  You may experience some discomfort or a burning sensation with urination after having this procedure. You can use over the counter Azo or pyridium to help with burning and follow the instructions on the packaging. If it does not improve within 1-2 days, or other symptoms appear (fever, chills, or difficulty urinating) call the office to speak to a nurse.  You may return to normal daily activities such as work, school, driving, exercising and housework on the day of the procedure. If your doctor gave you a prescription, take it as ordered.  If you need a return appointment, the front desk staff will arrange it when you check out.   

## 2021-01-14 NOTE — Addendum Note (Signed)
Addended by: Blenda Nicely on: 01/14/2021 04:46 PM   Modules accepted: Orders

## 2021-01-14 NOTE — Progress Notes (Signed)
CYSTOSCOPY  CC:  This is a 80 y.o. with microscopic hematuria who presents today for cystoscopy.  POC urine: glucose, trace protein  BP 127/77   Pulse 74   CYSTOSCOPY: A time out was performed.  The periurethral area was prepped and draped in a sterile manner.  2% lidocaine jetpack was inserted at the urethral meatus and the urethra and bladder visualized with 0- and 70-degree scopes.  She had normal urethral coaptation and normal urethral mucosa.  She had normal bladder mucosa with some trabeculations noted. She had bilateral clear efflux from both ureteral orifices.  She had no squamous metaplasia at the trigone, cellules or diverticuli.    CT A/P (01/09/21):  IMPRESSION: 1. No urolithiasis. No hydronephrosis. No suspicious renal cortical masses. No evidence of urothelial lesions, with limitations as described. 2. Cholelithiasis. 3. Coronary atherosclerosis.   ASSESSMENT:  80 y.o. with microscopic hematuria. Cystoscopy today is normal. No GU concerns on CT A/P  PLAN:  - We reviewed the results of the CT and today's cystoscopy.  - Per AUA guidelines, should consider repeat urinalysis in a year - We discussed starting a medication for her urinary urgency and she declined at this time.   All questions answered and post-procedures instructions were given. Follow up as needed.   Jaquita Folds, MD

## 2021-02-04 DIAGNOSIS — L820 Inflamed seborrheic keratosis: Secondary | ICD-10-CM | POA: Diagnosis not present

## 2021-02-04 DIAGNOSIS — Z85828 Personal history of other malignant neoplasm of skin: Secondary | ICD-10-CM | POA: Diagnosis not present

## 2021-02-04 DIAGNOSIS — D2371 Other benign neoplasm of skin of right lower limb, including hip: Secondary | ICD-10-CM | POA: Diagnosis not present

## 2021-02-04 DIAGNOSIS — D225 Melanocytic nevi of trunk: Secondary | ICD-10-CM | POA: Diagnosis not present

## 2021-02-04 DIAGNOSIS — L570 Actinic keratosis: Secondary | ICD-10-CM | POA: Diagnosis not present

## 2021-02-04 DIAGNOSIS — L814 Other melanin hyperpigmentation: Secondary | ICD-10-CM | POA: Diagnosis not present

## 2021-02-04 DIAGNOSIS — L821 Other seborrheic keratosis: Secondary | ICD-10-CM | POA: Diagnosis not present

## 2021-03-17 DIAGNOSIS — E1142 Type 2 diabetes mellitus with diabetic polyneuropathy: Secondary | ICD-10-CM | POA: Diagnosis not present

## 2021-03-17 DIAGNOSIS — Z79811 Long term (current) use of aromatase inhibitors: Secondary | ICD-10-CM | POA: Diagnosis not present

## 2021-03-17 DIAGNOSIS — R69 Illness, unspecified: Secondary | ICD-10-CM | POA: Diagnosis not present

## 2021-03-17 DIAGNOSIS — E785 Hyperlipidemia, unspecified: Secondary | ICD-10-CM | POA: Diagnosis not present

## 2021-03-17 DIAGNOSIS — Z794 Long term (current) use of insulin: Secondary | ICD-10-CM | POA: Diagnosis not present

## 2021-03-17 DIAGNOSIS — D6869 Other thrombophilia: Secondary | ICD-10-CM | POA: Diagnosis not present

## 2021-03-17 DIAGNOSIS — Z7901 Long term (current) use of anticoagulants: Secondary | ICD-10-CM | POA: Diagnosis not present

## 2021-03-17 DIAGNOSIS — I4891 Unspecified atrial fibrillation: Secondary | ICD-10-CM | POA: Diagnosis not present

## 2021-03-17 DIAGNOSIS — C50919 Malignant neoplasm of unspecified site of unspecified female breast: Secondary | ICD-10-CM | POA: Diagnosis not present

## 2021-03-17 DIAGNOSIS — Z7984 Long term (current) use of oral hypoglycemic drugs: Secondary | ICD-10-CM | POA: Diagnosis not present

## 2021-03-24 ENCOUNTER — Encounter (INDEPENDENT_AMBULATORY_CARE_PROVIDER_SITE_OTHER): Payer: Medicare HMO | Admitting: Ophthalmology

## 2021-04-05 DIAGNOSIS — Z20822 Contact with and (suspected) exposure to covid-19: Secondary | ICD-10-CM | POA: Diagnosis not present

## 2021-04-05 DIAGNOSIS — B349 Viral infection, unspecified: Secondary | ICD-10-CM | POA: Diagnosis not present

## 2021-04-06 DIAGNOSIS — R0981 Nasal congestion: Secondary | ICD-10-CM | POA: Diagnosis not present

## 2021-04-06 DIAGNOSIS — R197 Diarrhea, unspecified: Secondary | ICD-10-CM | POA: Diagnosis not present

## 2021-04-06 DIAGNOSIS — Z7901 Long term (current) use of anticoagulants: Secondary | ICD-10-CM | POA: Diagnosis not present

## 2021-04-06 DIAGNOSIS — U071 COVID-19: Secondary | ICD-10-CM | POA: Diagnosis not present

## 2021-04-06 DIAGNOSIS — R5383 Other fatigue: Secondary | ICD-10-CM | POA: Diagnosis not present

## 2021-04-06 DIAGNOSIS — Z1152 Encounter for screening for COVID-19: Secondary | ICD-10-CM | POA: Diagnosis not present

## 2021-04-06 DIAGNOSIS — I4892 Unspecified atrial flutter: Secondary | ICD-10-CM | POA: Diagnosis not present

## 2021-04-06 DIAGNOSIS — R059 Cough, unspecified: Secondary | ICD-10-CM | POA: Diagnosis not present

## 2021-04-06 DIAGNOSIS — E1169 Type 2 diabetes mellitus with other specified complication: Secondary | ICD-10-CM | POA: Diagnosis not present

## 2021-04-17 DIAGNOSIS — M545 Low back pain, unspecified: Secondary | ICD-10-CM | POA: Diagnosis not present

## 2021-04-27 ENCOUNTER — Encounter (INDEPENDENT_AMBULATORY_CARE_PROVIDER_SITE_OTHER): Payer: Self-pay | Admitting: Ophthalmology

## 2021-04-27 ENCOUNTER — Other Ambulatory Visit: Payer: Self-pay

## 2021-04-27 ENCOUNTER — Ambulatory Visit (INDEPENDENT_AMBULATORY_CARE_PROVIDER_SITE_OTHER): Payer: Medicare HMO | Admitting: Ophthalmology

## 2021-04-27 DIAGNOSIS — E113492 Type 2 diabetes mellitus with severe nonproliferative diabetic retinopathy without macular edema, left eye: Secondary | ICD-10-CM | POA: Diagnosis not present

## 2021-04-27 DIAGNOSIS — E113411 Type 2 diabetes mellitus with severe nonproliferative diabetic retinopathy with macular edema, right eye: Secondary | ICD-10-CM | POA: Diagnosis not present

## 2021-04-27 NOTE — Progress Notes (Signed)
04/27/2021     CHIEF COMPLAINT Patient presents for Retina Follow Up (7 month fu ou and oct/Pt states VA OU stable since last visit. Pt denies FOL, floaters, or ocular pain OU. /A1C:9.0/LBS: 175/Pt reports using Lumigan QHS OU/)   HISTORY OF PRESENT ILLNESS: Dorothy Herring is a 80 y.o. female who presents to the clinic today for:   HPI     Retina Follow Up           Diagnosis: Diabetic Retinopathy   Laterality: both eyes   Onset: 7 months ago   Severity: mild   Duration: 7 months   Course: stable   Comments: 7 month fu ou and oct Pt states VA OU stable since last visit. Pt denies FOL, floaters, or ocular pain OU.  A1C:9.0 LBS: 175 Pt reports using Lumigan QHS OU        Last edited by Kendra Opitz, COA on 04/27/2021  1:16 PM.      Referring physician: Shon Baton, MD [address] [place],  [place] [address]  HISTORICAL INFORMATION:   Selected notes from the MEDICAL RECORD NUMBER       CURRENT MEDICATIONS: Current Outpatient Medications (Ophthalmic Drugs)  Medication Sig   LUMIGAN 0.01 % SOLN Place 1 drop into both eyes at bedtime.    No current facility-administered medications for this visit. (Ophthalmic Drugs)   Current Outpatient Medications (Other)  Medication Sig   anastrozole (ARIMIDEX) 1 MG tablet Take 1 tablet (1 mg total) by mouth daily.   empagliflozin (JARDIANCE) 10 MG TABS tablet Take 10 mg by mouth daily.   gabapentin (NEURONTIN) 300 MG capsule Take 300 mg by mouth daily.    insulin glargine (LANTUS) 100 UNIT/ML injection Inject 24 Units into the skin at bedtime.   rosuvastatin (CRESTOR) 40 MG tablet Take 40 mg by mouth daily.   XARELTO 20 MG TABS tablet Take 20 mg by mouth daily.   No current facility-administered medications for this visit. (Other)      REVIEW OF SYSTEMS:    ALLERGIES No Known Allergies  PAST MEDICAL HISTORY Past Medical History:  Diagnosis Date   Cancer ([place]) 2019   left breast cancer, s/p XRT s/p lumpectomy    Cataract    removed both eyes   Diabetes mellitus    Ductal carcinoma in situ (DCIS) of left breast 02/06/2018   Glaucoma    History of radiation therapy 05/02/18- 05/24/18   Left Breast 42.56 Gy in 16 fractions.    Hyperlipidemia    Internal hemorrhoids 11/30/2011   Longstanding persistent atrial fibrillation (HCC)    Posterior capsular opacification, right 05/12/2020   Recent YAG capsulotomy with Dr. Katy Apo, condition resolved   Rectal bleeding 11/30/2011   TIA (transient ischemic attack)    "years ago"   Past Surgical History:  Procedure Laterality Date   ABDOMINAL HYSTERECTOMY  age 59   BILATERAL SALPINGOOPHORECTOMY     By patient history   BREAST BIOPSY     BREAST LUMPECTOMY WITH RADIOACTIVE SEED LOCALIZATION Left 02/27/2018   Procedure: BREAST LUMPECTOMY WITH RADIOACTIVE SEED LOCALIZATION;  Surgeon: Alphonsa Overall, MD;  Location: [facility];  Service: General;  Laterality: Left;   CARDIOVERSION N/A 05/31/2018   Procedure: CARDIOVERSION;  Surgeon: [name], MD;  Location: [facility]COPY;  Service: Cardiovascular;  Laterality: N/A;   COLONOSCOPY      FAMILY HISTORY Family History  Problem Relation Age of Onset   Lung cancer Father    Alzheimer's disease  Mother    Colon cancer Neg Hx    Colon polyps Neg Hx    Esophageal cancer Neg Hx    Rectal cancer Neg Hx    Stomach cancer Neg Hx     SOCIAL HISTORY Social History   Tobacco Use   Smoking status: Never   Smokeless tobacco: Never  Vaping Use   Vaping Use: Never used  Substance Use Topics   Alcohol use: Not Currently   Drug use: No         OPHTHALMIC EXAM:  Base Eye Exam     Visual Acuity (ETDRS)       Right Left   Dist [place] 20/20 -2 20/50 -1   Dist ph [place]  20/20 -1         Tonometry (Tonopen, 1:19 PM)       Right Left   Pressure 10 11         Pupils       Pupils Dark Light Shape React APD   Right PERRL 4 3 Round Brisk None   Left PERRL 4 3 Round Brisk None          Visual Fields (Counting fingers)       Left Right    Full Full         Extraocular Movement       Right Left    Full Full         Neuro/Psych     Oriented x3: Yes   Mood/Affect: Normal         Dilation     Both eyes: 1.0% Mydriacyl, 2.5% Phenylephrine @ 1:19 PM           Slit Lamp and Fundus Exam     External Exam       Right Left   External Normal Normal         Slit Lamp Exam       Right Left   Lids/Lashes Normal Normal   Conjunctiva/Sclera White and quiet White and quiet   Cornea Clear Clear   Anterior Chamber Deep and quiet Deep and quiet   Iris Round and reactive Round and reactive   Lens Posterior chamber intraocular lens,, Posterior capsular opacification, Open posterior capsule Posterior chamber intraocular lens, 1+ Posterior capsular opacification, not in vis.axis   Anterior Vitreous Normal Normal         Fundus Exam       Right Left   Posterior Vitreous Posterior vitreous detachment Normal   Disc Normal Normal   C/D Ratio 0.55 0.55   Macula Microaneurysms, Macular thickening, no clinically significant macular edema Microaneurysms, no macular thickening, no clinically significant macular edema, Hard drusen   Vessels NPDR-Severe NPDR-Severe   Periphery Normal, mild scatter in temporal peripheral anterior retina Normal, mild scatter in temporal peripheral anterior retina            IMAGING AND PROCEDURES  Imaging and Procedures for 04/27/21  OCT, Retina - OU - Both Eyes       Right Eye Quality was good. Scan locations included subfoveal. Central Foveal Thickness: 245. Progression has been stable. Findings include normal foveal contour.   Left Eye Quality was good. Scan locations included subfoveal. Central Foveal Thickness: 251. Progression has been stable. Findings include normal foveal contour.   Notes No active CSME either eye             ASSESSMENT/PLAN:  Severe nonproliferative diabetic retinopathy of  left eye without macular edema associated  with type 2 diabetes mellitus (HCC) Severe NPDR, with signs of fewer progression.  Anterior temporal moderate scatter PRP delivered to the region and previous noneffusion and ischemia now has stabilized the entire eye  Severe nonproliferative diabetic retinopathy of right eye, with macular edema, associated with type 2 diabetes mellitus (HCC) Severe NPDR, with signs of fewer progression.  Anterior temporal moderate scatter PRP delivered to the region and previous noneffusion and ischemia now has stabilized the entire eye     ICD-10-CM   1. Severe nonproliferative diabetic retinopathy of right eye, with macular edema, associated with type 2 diabetes mellitus (HCC)  [id number] OCT, Retina - OU - Both Eyes    2. Severe nonproliferative diabetic retinopathy of left eye without macular edema associated with type 2 diabetes mellitus (HCC)  [id number] OCT, Retina - OU - Both Eyes      1.  Severe NPDR OU now well controlled status post peripheral anterior PRP.  No recurrence of CSME OU.  2.  Okay to extend interval follow-up now to 9 months  3.  Ophthalmic Meds Ordered this visit:  No orders of the defined types were placed in this encounter.      Return in about 9 months (around 01/25/2022) for DILATE OU, COLOR FP.  There are no Patient Instructions on file for this visit.   Explained the diagnoses, plan, and follow up with the patient and they expressed understanding.  Patient expressed understanding of the importance of proper follow up care.   Clent Demark Jashley Yellin M.D. Diseases & Surgery of the Retina and Vitreous [facility] [place] 04/27/21     Abbreviations: M myopia (nearsighted); A astigmatism; H hyperopia (farsighted); P presbyopia; Mrx spectacle prescription;  CTL contact lenses; OD right eye; OS left eye; OU both eyes  XT exotropia; ET esotropia; PEK punctate epithelial keratitis; PEE punctate epithelial erosions; DES dry eye  syndrome; MGD meibomian gland dysfunction; ATs artificial tears; PFAT's preservative free artificial tears; [place] nuclear sclerotic cataract; PSC posterior subcapsular cataract; ERM epi-retinal membrane; PVD posterior vitreous detachment; RD retinal detachment; DM diabetes mellitus; DR diabetic retinopathy; NPDR non-proliferative diabetic retinopathy; PDR proliferative diabetic retinopathy; CSME clinically significant macular edema; DME diabetic macular edema; dbh dot blot hemorrhages; CWS cotton wool spot; POAG primary open angle glaucoma; C/D cup-to-disc ratio; HVF humphrey visual field; GVF goldmann visual field; OCT optical coherence tomography; IOP intraocular pressure; BRVO Branch retinal vein occlusion; CRVO central retinal vein occlusion; CRAO central retinal artery occlusion; BRAO branch retinal artery occlusion; RT retinal tear; SB scleral buckle; PPV pars plana vitrectomy; VH Vitreous hemorrhage; PRP panretinal laser photocoagulation; IVK intravitreal kenalog; VMT vitreomacular traction; MH Macular hole;  NVD neovascularization of the disc; NVE neovascularization elsewhere; AREDS age related eye disease study; ARMD age related macular degeneration; POAG primary open angle glaucoma; EBMD epithelial/anterior basement membrane dystrophy; ACIOL anterior chamber intraocular lens; IOL intraocular lens; PCIOL posterior chamber intraocular lens; Phaco/IOL phacoemulsification with intraocular lens placement; [place] photorefractive keratectomy; LASIK laser assisted in situ keratomileusis; HTN hypertension; DM diabetes mellitus; COPD chronic obstructive pulmonary disease

## 2021-04-27 NOTE — Assessment & Plan Note (Signed)
Severe NPDR, with signs of fewer progression.  Anterior temporal moderate scatter PRP delivered to the region and previous noneffusion and ischemia now has stabilized the entire eye

## 2021-05-06 ENCOUNTER — Encounter: Payer: Self-pay | Admitting: Hematology and Oncology

## 2021-05-06 DIAGNOSIS — R921 Mammographic calcification found on diagnostic imaging of breast: Secondary | ICD-10-CM | POA: Diagnosis not present

## 2021-05-06 DIAGNOSIS — R928 Other abnormal and inconclusive findings on diagnostic imaging of breast: Secondary | ICD-10-CM | POA: Diagnosis not present

## 2021-05-12 DIAGNOSIS — E1165 Type 2 diabetes mellitus with hyperglycemia: Secondary | ICD-10-CM | POA: Diagnosis not present

## 2021-05-12 DIAGNOSIS — E785 Hyperlipidemia, unspecified: Secondary | ICD-10-CM | POA: Diagnosis not present

## 2021-05-12 DIAGNOSIS — E559 Vitamin D deficiency, unspecified: Secondary | ICD-10-CM | POA: Diagnosis not present

## 2021-05-18 DIAGNOSIS — E1169 Type 2 diabetes mellitus with other specified complication: Secondary | ICD-10-CM | POA: Diagnosis not present

## 2021-05-18 DIAGNOSIS — E11319 Type 2 diabetes mellitus with unspecified diabetic retinopathy without macular edema: Secondary | ICD-10-CM | POA: Diagnosis not present

## 2021-05-18 DIAGNOSIS — Z794 Long term (current) use of insulin: Secondary | ICD-10-CM | POA: Diagnosis not present

## 2021-05-18 DIAGNOSIS — D0512 Intraductal carcinoma in situ of left breast: Secondary | ICD-10-CM | POA: Diagnosis not present

## 2021-05-18 DIAGNOSIS — I4892 Unspecified atrial flutter: Secondary | ICD-10-CM | POA: Diagnosis not present

## 2021-05-18 DIAGNOSIS — D6869 Other thrombophilia: Secondary | ICD-10-CM | POA: Diagnosis not present

## 2021-05-18 DIAGNOSIS — I10 Essential (primary) hypertension: Secondary | ICD-10-CM | POA: Diagnosis not present

## 2021-05-18 DIAGNOSIS — E1140 Type 2 diabetes mellitus with diabetic neuropathy, unspecified: Secondary | ICD-10-CM | POA: Diagnosis not present

## 2021-05-18 DIAGNOSIS — Z0000 Encounter for general adult medical examination without abnormal findings: Secondary | ICD-10-CM | POA: Diagnosis not present

## 2021-05-18 DIAGNOSIS — Z1389 Encounter for screening for other disorder: Secondary | ICD-10-CM | POA: Diagnosis not present

## 2021-05-18 DIAGNOSIS — Z23 Encounter for immunization: Secondary | ICD-10-CM | POA: Diagnosis not present

## 2021-05-18 DIAGNOSIS — Z1331 Encounter for screening for depression: Secondary | ICD-10-CM | POA: Diagnosis not present

## 2021-05-18 DIAGNOSIS — E1165 Type 2 diabetes mellitus with hyperglycemia: Secondary | ICD-10-CM | POA: Diagnosis not present

## 2021-06-22 DIAGNOSIS — L821 Other seborrheic keratosis: Secondary | ICD-10-CM | POA: Diagnosis not present

## 2021-06-22 DIAGNOSIS — Z85828 Personal history of other malignant neoplasm of skin: Secondary | ICD-10-CM | POA: Diagnosis not present

## 2021-06-26 DIAGNOSIS — M65341 Trigger finger, right ring finger: Secondary | ICD-10-CM | POA: Diagnosis not present

## 2021-06-26 DIAGNOSIS — M65321 Trigger finger, right index finger: Secondary | ICD-10-CM | POA: Diagnosis not present

## 2021-06-26 DIAGNOSIS — M65322 Trigger finger, left index finger: Secondary | ICD-10-CM | POA: Diagnosis not present

## 2021-06-26 DIAGNOSIS — M65332 Trigger finger, left middle finger: Secondary | ICD-10-CM | POA: Diagnosis not present

## 2021-06-26 DIAGNOSIS — M65331 Trigger finger, right middle finger: Secondary | ICD-10-CM | POA: Diagnosis not present

## 2021-06-26 DIAGNOSIS — M65342 Trigger finger, left ring finger: Secondary | ICD-10-CM | POA: Diagnosis not present

## 2021-07-26 ENCOUNTER — Other Ambulatory Visit: Payer: Self-pay | Admitting: Hematology and Oncology

## 2021-08-10 ENCOUNTER — Telehealth: Payer: Self-pay | Admitting: Pharmacy Technician

## 2021-08-10 DIAGNOSIS — Z596 Low income: Secondary | ICD-10-CM

## 2021-08-10 NOTE — Progress Notes (Addendum)
[facility])                                            [place] Team    08/10/2021  Dorothy Herring 06/20/1941 [id number]  FOR 2023 RE ENROLLMENT                                      Medication Assistance Referral  Referral From: [place]   Medication/Company: [employer] / [place] Patient application portion:  N/A already re enrolled for [id number] Provider application portion: Faxed  to Dr. Virgina Jock Provider address/fax verified via: Office website  Medication/Company: Tyler Aas or Generic Tyler Aas depending on how provider completes his portion of the application / [employer] Patient application portion:  Education officer, museum portion: Faxed  to Dr. Virgina Jock Provider address/fax verified via: Office website    [employer]. Dorothy Herring, [place]  [phone]

## 2021-08-26 DIAGNOSIS — H401131 Primary open-angle glaucoma, bilateral, mild stage: Secondary | ICD-10-CM | POA: Diagnosis not present

## 2021-09-02 DIAGNOSIS — R350 Frequency of micturition: Secondary | ICD-10-CM | POA: Diagnosis not present

## 2021-09-07 ENCOUNTER — Encounter (HOSPITAL_BASED_OUTPATIENT_CLINIC_OR_DEPARTMENT_OTHER): Payer: Self-pay | Admitting: *Deleted

## 2021-09-07 ENCOUNTER — Other Ambulatory Visit: Payer: Self-pay

## 2021-09-07 ENCOUNTER — Emergency Department (HOSPITAL_BASED_OUTPATIENT_CLINIC_OR_DEPARTMENT_OTHER)
Admission: EM | Admit: 2021-09-07 | Discharge: 2021-09-07 | Disposition: A | Discharge status: Home / Self Care | Payer: Medicare HMO | Attending: Emergency Medicine | Admitting: Emergency Medicine

## 2021-09-07 ENCOUNTER — Emergency Department (HOSPITAL_BASED_OUTPATIENT_CLINIC_OR_DEPARTMENT_OTHER): Payer: Medicare HMO

## 2021-09-07 DIAGNOSIS — Z794 Long term (current) use of insulin: Secondary | ICD-10-CM | POA: Insufficient documentation

## 2021-09-07 DIAGNOSIS — E113311 Type 2 diabetes mellitus with moderate nonproliferative diabetic retinopathy with macular edema, right eye: Secondary | ICD-10-CM | POA: Insufficient documentation

## 2021-09-07 DIAGNOSIS — Z7901 Long term (current) use of anticoagulants: Secondary | ICD-10-CM | POA: Diagnosis not present

## 2021-09-07 DIAGNOSIS — R42 Dizziness and giddiness: Secondary | ICD-10-CM

## 2021-09-07 DIAGNOSIS — Z20822 Contact with and (suspected) exposure to covid-19: Secondary | ICD-10-CM | POA: Insufficient documentation

## 2021-09-07 DIAGNOSIS — E860 Dehydration: Secondary | ICD-10-CM | POA: Diagnosis not present

## 2021-09-07 DIAGNOSIS — R270 Ataxia, unspecified: Secondary | ICD-10-CM | POA: Diagnosis not present

## 2021-09-07 DIAGNOSIS — Z79899 Other long term (current) drug therapy: Secondary | ICD-10-CM | POA: Insufficient documentation

## 2021-09-07 DIAGNOSIS — I4891 Unspecified atrial fibrillation: Secondary | ICD-10-CM | POA: Diagnosis not present

## 2021-09-07 DIAGNOSIS — Z853 Personal history of malignant neoplasm of breast: Secondary | ICD-10-CM | POA: Insufficient documentation

## 2021-09-07 LAB — URINALYSIS, ROUTINE W REFLEX MICROSCOPIC
Bilirubin Urine: NEGATIVE
Glucose, UA: >1000 mg/dL — AB
Ketones, ur: 15 mg/dL — AB
Nitrite: NEGATIVE
Protein, ur: 30 mg/dL — AB
Specific Gravity, Urine: 1.033 — ABNORMAL HIGH (ref 1.005–1.030)
pH: 5.5 (ref 5.0–8.0)

## 2021-09-07 LAB — CBC
HCT: 44 % (ref 36.0–46.0)
Hemoglobin: 14.3 g/dL (ref 12.0–15.0)
MCH: 28 pg (ref 26.0–34.0)
MCHC: 32.5 g/dL (ref 30.0–36.0)
MCV: 86.3 fL (ref 80.0–100.0)
Platelets: 192 10*3/uL (ref 150–400)
RBC: 5.1 MIL/uL (ref 3.87–5.11)
RDW: 12.9 % (ref 11.5–15.5)
WBC: 10.2 10*3/uL (ref 4.0–10.5)
nRBC: 0 % (ref 0.0–0.2)

## 2021-09-07 LAB — BASIC METABOLIC PANEL
Anion gap: 12 (ref 5–15)
BUN: 24 mg/dL — ABNORMAL HIGH (ref 8–23)
CO2: 27 mmol/L (ref 22–32)
Calcium: 10.5 mg/dL — ABNORMAL HIGH (ref 8.9–10.3)
Chloride: 100 mmol/L (ref 98–111)
Creatinine, Ser: 0.94 mg/dL (ref 0.44–1.00)
GFR, Estimated: >60 mL/min (ref >60)
Glucose, Bld: 174 mg/dL — ABNORMAL HIGH (ref 70–99)
Potassium: 4.5 mmol/L (ref 3.5–5.1)
Sodium: 139 mmol/L (ref 135–145)

## 2021-09-07 LAB — CBG MONITORING, ED: Glucose-Capillary: 164 mg/dL — ABNORMAL HIGH (ref 70–99)

## 2021-09-07 LAB — RESP PANEL BY RT-PCR (FLU A&B, COVID) ARPGX2
Influenza A by PCR: NEGATIVE
Influenza B by PCR: NEGATIVE
SARS Coronavirus 2 by RT PCR: NEGATIVE

## 2021-09-07 MED ORDER — SODIUM CHLORIDE 0.9 % IV BOLUS
500.0000 mL | Freq: Once | INTRAVENOUS | Status: AC
  Administered 2021-09-07: 15:00:00 500 mL via INTRAVENOUS

## 2021-09-07 MED ORDER — SODIUM CHLORIDE 0.9 % IV SOLN
1000.0000 mL | INTRAVENOUS | Status: DC
  Administered 2021-09-07: 11:00:00 1000 mL via INTRAVENOUS

## 2021-09-07 MED ORDER — SODIUM CHLORIDE 0.9 % IV BOLUS (SEPSIS)
500.0000 mL | Freq: Once | INTRAVENOUS | Status: AC
  Administered 2021-09-07: 11:00:00 500 mL via INTRAVENOUS

## 2021-09-07 NOTE — ED Triage Notes (Signed)
States began having dizziness three days ago, today much worse, unable to stand and walk without assistance

## 2021-09-07 NOTE — ED Notes (Signed)
RT Note: Pt. ambulated from room to 1/2 way to bathroom with RN @ side on room air, became lite headed/dizzy/pale, placed in w/c for remaining distance to restroom, RN in with pt., returned to room in w/c, results are as follows: Start: @1308  P-110/Sat-99% Restroom: P-122/Sat-100% Back @ room: P:85/Sat:98%

## 2021-09-07 NOTE — ED Notes (Signed)
Pt's CBG result was 164. Informed Laneta Simmers.

## 2021-09-07 NOTE — ED Provider Notes (Addendum)
[facility] EMERGENCY DEPT Provider Note   CSN: [id number] Arrival date & time: 09/07/21  0944     History Chief Complaint  Patient presents with   Dizziness    Dorothy Herring is a 80 y.o. female.   Dizziness  Patient presents to the ER for evaluation of dizziness.  Patient states initially she had URI type symptoms a couple weeks ago.  Seem to mostly get better.  In the last few days however she started having trouble with dizziness.  Patient states she finds that it is hard for her to walk.  When she stands she gets very lightheaded.  She has not noticed the room spinning.  She has not had any issues with her speech.  She did find that it was a little bit hard to read last night because her vision was blurry.  She is not having any abdominal pain.  No chest pain.  She has not noticed any blood in her stool.  She called the nurse at her doctor's office and was told she could be dehydrated.  Past Medical History:  Diagnosis Date   Cancer [facility]) 2019   left breast cancer, s/p XRT s/p lumpectomy   Cataract    removed both eyes   Diabetes mellitus    Ductal carcinoma in situ (DCIS) of left breast 02/06/2018   Glaucoma    History of radiation therapy 05/02/18- 05/24/18   Left Breast 42.56 Gy in 16 fractions.    Hyperlipidemia    Internal hemorrhoids 11/30/2011   Longstanding persistent atrial fibrillation (HCC)    Posterior capsular opacification, right 05/12/2020   Recent YAG capsulotomy with Dr. Katy Apo, condition resolved   Rectal bleeding 11/30/2011   TIA (transient ischemic attack)    "years ago"    Patient Active Problem List   Diagnosis Date Noted   Posterior capsular opacification, left 09/24/2020   Severe nonproliferative diabetic retinopathy of left eye without macular edema associated with type 2 diabetes mellitus ([place]) 05/12/2020   Severe nonproliferative diabetic retinopathy of right eye, with macular edema, associated with type 2 diabetes mellitus  ([place]) 05/12/2020   Diarrhea 11/28/2019   Persistent atrial fibrillation ([place])    Ductal carcinoma in situ (DCIS) of left breast 02/06/2018   Internal hemorrhoids 11/30/2011   Rectal bleeding 11/30/2011    Past Surgical History:  Procedure Laterality Date   ABDOMINAL HYSTERECTOMY  age 18   BILATERAL SALPINGOOPHORECTOMY     By patient history   BREAST BIOPSY     BREAST LUMPECTOMY WITH RADIOACTIVE SEED LOCALIZATION Left 02/27/2018   Procedure: BREAST LUMPECTOMY WITH RADIOACTIVE SEED LOCALIZATION;  Surgeon: Alphonsa Overall, MD;  Location: [facility];  Service: General;  Laterality: Left;   CARDIOVERSION N/A 05/31/2018   Procedure: CARDIOVERSION;  Surgeon: [name], MD;  Location: [facility] ENDOSCOPY;  Service: Cardiovascular;  Laterality: N/A;   COLONOSCOPY       OB History     Gravida  1   Para  1   Term      Preterm      AB      Living  1      SAB      IAB      Ectopic      Multiple      Live Births              Family History  Problem Relation Age of Onset   Lung cancer Father    Alzheimer's disease Mother  Colon cancer Neg Hx    Colon polyps Neg Hx    Esophageal cancer Neg Hx    Rectal cancer Neg Hx    Stomach cancer Neg Hx     Social History   Tobacco Use   Smoking status: Never   Smokeless tobacco: Never  Vaping Use   Vaping Use: Never used  Substance Use Topics   Alcohol use: Not Currently   Drug use: No    Home Medications Prior to Admission medications   Medication Sig Start Date End Date Taking? Authorizing Provider  anastrozole (ARIMIDEX) 1 MG tablet TAKE 1 TABLET BY MOUTH EVERY DAY 07/27/21   Nicholas Lose, MD  empagliflozin (JARDIANCE) 10 MG TABS tablet Take 10 mg by mouth daily.    [provider]  gabapentin (NEURONTIN) 300 MG capsule Take 300 mg by mouth daily.     [provider]  insulin glargine (LANTUS) 100 UNIT/ML injection Inject 24 Units into the skin at bedtime.    [provider]  LUMIGAN 0.01 % SOLN Place 1 drop into both eyes at bedtime.  03/09/18   [provider]  rosuvastatin (CRESTOR) 40 MG tablet Take 40 mg by mouth daily. 12/31/17   [provider]  XARELTO 20 MG TABS tablet Take 20 mg by mouth daily. 12/10/19   [provider]    Allergies    Patient has no known allergies.  Review of Systems   Review of Systems  Neurological:  Positive for dizziness.  All other systems reviewed and are negative.  Physical Exam Updated Vital Signs BP 122/71    Pulse 86    Temp 97.7 F (36.5 C) (Oral)    Resp 14    Ht 1.626 m (5\' 4" )    Wt 56.9 kg    SpO2 100%    BMI 21.53 kg/m   Physical Exam Vitals and nursing note reviewed.  Constitutional:      General: She is not in acute distress.    Appearance: She is well-developed.  HENT:     Head: Normocephalic and atraumatic.     Right Ear: External ear normal.     Left Ear: External ear normal.  Eyes:     General: No scleral icterus.       Right eye: No discharge.        Left eye: No discharge.     Conjunctiva/sclera: Conjunctivae normal.  Neck:     Trachea: No tracheal deviation.  Cardiovascular:     Rate and Rhythm: Normal rate and regular rhythm.  Pulmonary:     Effort: Pulmonary effort is normal. No respiratory distress.     Breath sounds: Normal breath sounds. No stridor. No wheezing or rales.  Abdominal:     General: Bowel sounds are normal. There is no distension.     Palpations: Abdomen is soft.     Tenderness: There is no abdominal tenderness. There is no guarding or rebound.  Musculoskeletal:        General: No tenderness.     Cervical back: Neck supple.  Skin:    General: Skin is warm and dry.     Findings: No rash.  Neurological:     Mental Status: She is alert and oriented to person, place, and time.     Cranial Nerves: No cranial nerve deficit (No facial droop, extraocular movements intact, tongue midline ).     Sensory: No sensory deficit.      Motor: No abnormal muscle tone or  seizure activity.     Coordination: Coordination normal.     Comments: No pronator drift bilateral upper extrem, able to hold both legs off bed for 5 seconds, sensation intact in all extremities, no visual field cuts, no left or right sided neglect, normal finger-nose exam bilaterally, no nystagmus noted     ED Results / Procedures / Treatments   Labs (all labs ordered are listed, but only abnormal results are displayed) Labs Reviewed  BASIC METABOLIC PANEL - Abnormal; Notable for the following components:      Result Value   Glucose, Bld 174 (*)    BUN 24 (*)    Calcium 10.5 (*)    All other components within normal limits  URINALYSIS, ROUTINE W REFLEX MICROSCOPIC - Abnormal; Notable for the following components:   APPearance HAZY (*)    Specific Gravity, Urine 1.033 (*)    Glucose, UA >1,000 (*)    Hgb urine dipstick SMALL (*)    Ketones, ur 15 (*)    Protein, ur 30 (*)    Leukocytes,Ua LARGE (*)    Non Squamous Epithelial 0-5 (*)    All other components within normal limits  CBG MONITORING, ED - Abnormal; Notable for the following components:   Glucose-Capillary 164 (*)    All other components within normal limits  RESP PANEL BY RT-PCR (FLU A&B, COVID) [id number]  CBC    EKG EKG Interpretation  Date/Time:  Monday September 07 2021 09:59:38 EST Ventricular Rate:  104 PR Interval:    QRS Duration: 97 QT Interval:  358 QTC Calculation: 462 R Axis:   33 Text Interpretation: Atrial fibrillation Ventricular premature complex Low voltage, precordial leads RSR' in V1 or V2, probably normal variant Nonspecific T abnrm, anterolateral leads No significant change since last tracing Confirmed by Dorie Rank [phone]) on 09/07/2021 10:07:27 AM  Radiology CT Head Wo Contrast  Result Date: 09/07/2021 CLINICAL DATA:  Neuro deficit, acute stroke suspected.  Dizziness. EXAM: CT HEAD WITHOUT CONTRAST TECHNIQUE: Contiguous axial images were obtained from the  base of the skull through the vertex without intravenous contrast. COMPARISON:  None. FINDINGS: Brain: No evidence of acute infarction, hemorrhage, hydrocephalus, extra-axial collection or mass lesion/mass effect. Vascular: No hyperdense vessel or unexpected calcification. Skull: Normal. Negative for fracture or focal lesion. Sinuses/Orbits: No acute finding. Other: None. IMPRESSION: No acute intracranial abnormality. Electronically Signed   By: Keane Police D.O.   On: 09/07/2021 11:01    Procedures Procedures   Medications Ordered in ED Medications  sodium chloride 0.9 % bolus 500 mL (0 mLs Intravenous Stopped 09/07/21 1330)    Followed by  0.9 %  sodium chloride infusion (0 mLs Intravenous Stopped 09/07/21 1338)  sodium chloride 0.9 % bolus 500 mL (has no administration in time range)    ED Course  I have reviewed the triage vital signs and the nursing notes.  Pertinent labs & imaging results that were available during my care of the patient were reviewed by me and considered in my medical decision making (see chart for details).  Clinical Course as of 09/07/21 1436  Mon Sep 07, 2021  1132 Head CT without acute findings [JK]  1132 BUN slightly elevated. [JK]  1133 CBC normal.  COVID and flu are negative [JK]  1433 Urinalysis does show large leukocytosis 6-10 red, 10 whites.  Not definitive for infection. [WY]  6378 Patient attempted to ambulate earlier.  She became very unsteady and could not walk [JK]    Clinical Course User Index [  JK] Dorie Rank, MD   MDM Rules/Calculators/A&P                         Patient presented to the ER for evaluation of dizziness.  Presentation concerning for the possibility of dehydration, pneumonia, anemia as well as stroke causing ataxia.  No definitive acute findings noted on ED work-up to account for symptoms.  Mild increase in BUN but no severe dehydration.  Patient was given IV fluids and no significant change.  COVID and flu are negative.  Patient  is not anemic.  Head CT does not show any acute findings.  Attempted to have patient walk in the ED but she became very unsteady and had immediately sit down.  At this point I do feel she would need further evaluation including possible neurology evaluation and MRI.  That is not available at this freestanding ED.  I will consult with the medical service for admission.    Final Clinical Impression(s) / ED Diagnoses Final diagnoses:  Ataxia     Dorie Rank, MD 09/07/21 1436  While waiting for admission the patient received additional IV fluids.  Patient decided she was feeling better and did not want to be admitted to the hospital.  She has been able to eat and drink.  Patient was able to walk to the bathroom and feels steady on her feet.  At this time she appears appropriate for discharge.  It is possible her symptoms were related to mild dehydration.  Warning signs and precautions discussed.   Dorie Rank, MD 09/07/21 1556

## 2021-09-07 NOTE — ED Notes (Signed)
BEFAST NEGATIVE

## 2021-09-07 NOTE — Discharge Instructions (Signed)
Make sure to drink plenty of fluids.  Return to the hospital if you start having any recurrent symptoms.

## 2021-09-23 ENCOUNTER — Telehealth: Payer: Self-pay | Admitting: Pharmacy Technician

## 2021-09-23 DIAGNOSIS — Z596 Low income: Secondary | ICD-10-CM

## 2021-09-23 NOTE — Progress Notes (Addendum)
[facility])                                            [place] Team    09/23/2021  Dorothy Herring [birth year] [id number]  Received provider portion(s) of patient assistance application(s) for Farxiga. Faxed completed application and required documents into [employer].  Have not received back patient's [employer] application for [place]. Re mailed to patient today    Luiz Ochoa. [name], [name]  [phone]

## 2021-10-01 ENCOUNTER — Telehealth: Payer: Self-pay | Admitting: *Deleted

## 2021-10-01 NOTE — Telephone Encounter (Signed)
Received VM from pt.  RN attempt x1 to return call.  No answer, LVM for pt to return call to the office.  °

## 2021-10-06 ENCOUNTER — Telehealth: Payer: Self-pay | Admitting: Pharmacy Technician

## 2021-10-06 DIAGNOSIS — Z596 Low income: Secondary | ICD-10-CM

## 2021-10-06 NOTE — Progress Notes (Addendum)
[facility])                                            [place] Team    10/06/2021  KIJANA ESTOCK 1941-09-19 [id number]  Received both patient and provider portion(s) of patient assistance application(s) for Generic Tresiba. Faxed completed application and required documents into [employer].   Aubryn Spinola P. Kataya Guimont, [place]  [phone]

## 2021-10-14 DIAGNOSIS — E1140 Type 2 diabetes mellitus with diabetic neuropathy, unspecified: Secondary | ICD-10-CM | POA: Diagnosis not present

## 2021-10-14 DIAGNOSIS — Z794 Long term (current) use of insulin: Secondary | ICD-10-CM | POA: Diagnosis not present

## 2021-10-14 DIAGNOSIS — E1165 Type 2 diabetes mellitus with hyperglycemia: Secondary | ICD-10-CM | POA: Diagnosis not present

## 2021-10-14 DIAGNOSIS — Z1331 Encounter for screening for depression: Secondary | ICD-10-CM | POA: Diagnosis not present

## 2021-10-14 DIAGNOSIS — Z1339 Encounter for screening examination for other mental health and behavioral disorders: Secondary | ICD-10-CM | POA: Diagnosis not present

## 2021-10-14 DIAGNOSIS — I4892 Unspecified atrial flutter: Secondary | ICD-10-CM | POA: Diagnosis not present

## 2021-10-14 DIAGNOSIS — I10 Essential (primary) hypertension: Secondary | ICD-10-CM | POA: Diagnosis not present

## 2021-10-14 DIAGNOSIS — Z8673 Personal history of transient ischemic attack (TIA), and cerebral infarction without residual deficits: Secondary | ICD-10-CM | POA: Diagnosis not present

## 2021-10-14 DIAGNOSIS — E11319 Type 2 diabetes mellitus with unspecified diabetic retinopathy without macular edema: Secondary | ICD-10-CM | POA: Diagnosis not present

## 2021-10-14 DIAGNOSIS — Z7901 Long term (current) use of anticoagulants: Secondary | ICD-10-CM | POA: Diagnosis not present

## 2021-10-14 DIAGNOSIS — E785 Hyperlipidemia, unspecified: Secondary | ICD-10-CM | POA: Diagnosis not present

## 2021-10-14 DIAGNOSIS — D0512 Intraductal carcinoma in situ of left breast: Secondary | ICD-10-CM | POA: Diagnosis not present

## 2021-10-22 ENCOUNTER — Telehealth: Payer: Self-pay | Admitting: Pharmacy Technician

## 2021-10-22 DIAGNOSIS — Z596 Low income: Secondary | ICD-10-CM

## 2021-10-22 NOTE — Progress Notes (Signed)
[place] [facility]                                            [place] Team    10/22/2021  Dorothy Herring 09-Aug-1941 [id number]  2 care coordination calls placed to [place] in regard to generic Tresiba application and [employer] in regard to [place] application.  Spoke to [place] at [employer] in regard to generic [place] application and she informs patient is APPROVED 10/20/21-09/19/22. She informs refills will process automatically based on last fill date in 2022 and going forward with delivery to the provider's office.  Spoke to [place] at [place] in regard to [place] application. She informs refills will process automatically based on last fill date in 2022 and going forward with delivery to the patient's home.  Tondalaya Perren P. Anum Palecek, [place]  [phone]

## 2021-10-27 NOTE — Progress Notes (Signed)
Patient Care Team: Shon Baton, MD as PCP - General (Internal Medicine) Alphonsa Overall, MD as Consulting Physician (General Surgery) Nicholas Lose, MD as Consulting Physician (Hematology and Oncology) Eppie Gibson, MD as Attending Physician (Radiation Oncology) Gardenia Phlegm, NP as Nurse Practitioner (Hematology and Oncology)  DIAGNOSIS:    ICD-10-CM   1. Ductal carcinoma in situ (DCIS) of left breast  D05.12       SUMMARY OF ONCOLOGIC HISTORY: Oncology History  Ductal carcinoma in situ (DCIS) of left breast  02/01/2018 Initial Diagnosis   Left nipple discharge, mammogram showing asymmetry, ultrasound reveals 0.5 cm area of concern at 12 o'clock position 1 cm from the nipple, axilla negative, biopsy revealed intermediate grade DCIS ER 100%, PR 100%, Tis N0 stage 0 clinical stage   02/27/2018 Surgery   Left lumpectomy: DCIS with calcifications, intermediate grade, 0.6 cm, margins negative, ER 100%, PR 100%, Tis N0 stage 0    05/02/2018 - 05/24/2018 Radiation Therapy   Adjuvant radiation therapy   Left Breast / 42.56 Gy in 16 fractions   06/2018 -  Anti-estrogen oral therapy   Anastrozole daily     CHIEF COMPLIANT: Follow-up of left breast DCIS on anastrozole   INTERVAL HISTORY: Dorothy Herring is a 81 y.o. with above-mentioned history of  left breast DCIS who underwent a lumpectomy, radiation and is currently on antiestrogen therapy with anastrozole. Mammogram on 05/06/2021 showed no evidence of malignancy. She presents to the clinic today for follow-up.  She felt a small skin tag in the right axilla and wanted [place] to check it out.  Overall she is tolerating anastrozole reasonably well.  She does feel hot and occasionally feels stiff and achy.  ALLERGIES:  has No Known Allergies.  MEDICATIONS:  Current Outpatient Medications  Medication Sig Dispense Refill   anastrozole (ARIMIDEX) 1 MG tablet TAKE 1 TABLET BY MOUTH EVERY DAY 90 tablet 0   empagliflozin (JARDIANCE) 10  MG TABS tablet Take 10 mg by mouth daily.     gabapentin (NEURONTIN) 300 MG capsule Take 300 mg by mouth daily.      insulin glargine (LANTUS) 100 UNIT/ML injection Inject 24 Units into the skin at bedtime.     LUMIGAN 0.01 % SOLN Place 1 drop into both eyes at bedtime.      rosuvastatin (CRESTOR) 40 MG tablet Take 40 mg by mouth daily.  0   XARELTO 20 MG TABS tablet Take 20 mg by mouth daily.     No current facility-administered medications for this visit.    PHYSICAL EXAMINATION: ECOG PERFORMANCE STATUS: 1 - Symptomatic but completely ambulatory  There were no vitals filed for this visit. There were no vitals filed for this visit.  BREAST: No palpable masses or nodules in either right or left breasts. No palpable axillary supraclavicular or infraclavicular adenopathy no breast tenderness or nipple discharge.  In the right axilla there is a hair follicle that appears to have thickened base.  But otherwise no palpable lumps or concerns.  (exam performed in the presence of a chaperone)  LABORATORY DATA:  I have reviewed the data as listed CMP Latest Ref Rng & Units 09/07/2021 01/09/2021 05/25/2018  Glucose 70 - 99 mg/dL 174(H) - 193(H)  BUN 8 - 23 mg/dL 24(H) - 18  Creatinine 0.44 - 1.00 mg/dL 0.94 0.90 0.99  Sodium 135 - 145 mmol/L 139 - 139  Potassium 3.5 - 5.1 mmol/L 4.5 - 4.2  Chloride 98 - 111 mmol/L 100 - 102  CO2 22 -  32 mmol/L 27 - 29  Calcium 8.9 - 10.3 mg/dL 10.5(H) - 10.0  Total Protein 6.4 - 8.3 g/dL - - -  Total Bilirubin 0.2 - 1.2 mg/dL - - -  Alkaline Phos 40 - 150 U/L - - -  AST 5 - 34 U/L - - -  ALT 0 - 55 U/L - - -    Lab Results  Component Value Date   WBC 10.2 09/07/2021   HGB 14.3 09/07/2021   HCT 44.0 09/07/2021   MCV 86.3 09/07/2021   PLT 192 09/07/2021   NEUTROABS 4.3 02/08/2018    ASSESSMENT & PLAN:  Ductal carcinoma in situ (DCIS) of left breast 02/27/2018:Left lumpectomy: DCIS with calcifications, intermediate grade, 0.6 cm, margins negative, ER  100%, PR 100%, Tis N0 stage 0   Treatment plan :  1.  Adjuvant radiation therapy  05/02/2018-05/24/18 2. followed by adjuvant antiestrogen therapy with Anastrozole 1 mg daily daily x5 years started June 20, 2018.   Letrozole toxicities: Continues to have hot flashes that are mild to moderate. But she had those even before starting anastrozole. Mild joint stiffness   Breast cancer surveillance: 1.  Breast exam 10/28/2021: Benign 2.  Mammogram at [facility] August 2022: Benign density cat B   Bone density 08/22/2018: T score -1.2: Mild osteopenia She had another bone density with Dr.Russo this year   Return to clinic in 1 year for follow-up    No orders of the defined types were placed in this encounter.  The patient has a good understanding of the overall plan. she agrees with it. she will call with any problems that may develop before the next visit here.  Total time spent: 20 mins including face to face time and time spent for planning, charting and coordination of care  Rulon Eisenmenger, MD, MPH 10/28/2021  I, Thana Ates, am acting as scribe for Dr. Nicholas Lose.  I have reviewed the above documentation for accuracy and completeness, and I agree with the above.

## 2021-10-27 NOTE — Assessment & Plan Note (Signed)
02/27/2018:Left lumpectomy: DCIS with calcifications, intermediate grade, 0.6 cm, margins negative, ER 100%, PR 100%, Tis N0 stage 0  Treatment plan :  1.Adjuvant radiation therapy8/13/2019-05/24/18 2.followed by adjuvant antiestrogen therapy withAnastrozole 1 mg dailydaily x5 yearsstartedOctober 1, 2019.  Letrozoletoxicities:Continues to have hot flashes that are mild to moderate. But she had those even before starting anastrozole.  Breast cancer surveillance: 1.Breast exam could not be done 2.Mammogram at [facility]: Benign density cat B  Bone density 08/22/2018: T score -1.2: Mild osteopenia She had another bone density with Dr.Russo this year  Return to clinic in 1 year for follow-up

## 2021-10-28 ENCOUNTER — Other Ambulatory Visit: Payer: Self-pay

## 2021-10-28 ENCOUNTER — Inpatient Hospital Stay: Payer: Medicare HMO | Attending: Hematology and Oncology | Admitting: Hematology and Oncology

## 2021-10-28 DIAGNOSIS — Z923 Personal history of irradiation: Secondary | ICD-10-CM | POA: Diagnosis not present

## 2021-10-28 DIAGNOSIS — Z79811 Long term (current) use of aromatase inhibitors: Secondary | ICD-10-CM | POA: Insufficient documentation

## 2021-10-28 DIAGNOSIS — Z7901 Long term (current) use of anticoagulants: Secondary | ICD-10-CM | POA: Insufficient documentation

## 2021-10-28 DIAGNOSIS — Z79899 Other long term (current) drug therapy: Secondary | ICD-10-CM | POA: Insufficient documentation

## 2021-10-28 DIAGNOSIS — D0512 Intraductal carcinoma in situ of left breast: Secondary | ICD-10-CM

## 2021-10-28 MED ORDER — ANASTROZOLE 1 MG PO TABS: 1.0000 mg | ORAL_TABLET | Freq: Every day | ORAL | 3 refills | Status: DC

## 2021-12-02 ENCOUNTER — Ambulatory Visit: Payer: Medicare HMO | Admitting: Cardiology

## 2021-12-25 NOTE — Progress Notes (Signed)
? ?PCP:  Shon Baton, MD ?Primary Cardiologist: None ?Electrophysiologist: Thompson Grayer, MD  ? ?Dorothy Herring is a 81 y.o. female seen today for Thompson Grayer, MD for routine electrophysiology followup.  Since last being seen in our clinic the patient reports doing very well.  she denies chest pain, palpitations, dyspnea, PND, orthopnea, nausea, vomiting, dizziness, syncope, edema, weight gain, or early satiety. ? ?Past Medical History:  ?Diagnosis Date  ? Cancer [facility]) 2019  ? left breast cancer, s/p XRT s/p lumpectomy  ? Cataract   ? removed both eyes  ? Diabetes mellitus   ? Ductal carcinoma in situ (DCIS) of left breast 02/06/2018  ? Glaucoma   ? History of radiation therapy 05/02/18- 05/24/18  ? Left Breast 42.56 Gy in 16 fractions.   ? Hyperlipidemia   ? Internal hemorrhoids 11/30/2011  ? Longstanding persistent atrial fibrillation ([place])   ? Posterior capsular opacification, right 05/12/2020  ? Recent YAG capsulotomy with Dr. Katy Apo, condition resolved  ? Rectal bleeding 11/30/2011  ? TIA (transient ischemic attack)   ? "years ago"  ? ?Past Surgical History:  ?Procedure Laterality Date  ? ABDOMINAL HYSTERECTOMY  age 26  ? BILATERAL SALPINGOOPHORECTOMY    ? By patient history  ? BREAST BIOPSY    ? BREAST LUMPECTOMY WITH RADIOACTIVE SEED LOCALIZATION Left 02/27/2018  ? Procedure: BREAST LUMPECTOMY WITH RADIOACTIVE SEED LOCALIZATION;  Surgeon: Alphonsa Overall, MD;  Location: [place];  Service: General;  Laterality: Left;  ? CARDIOVERSION N/A 05/31/2018  ? Procedure: CARDIOVERSION;  Surgeon: [name], MD;  Location: [facility] ENDOSCOPY;  Service: Cardiovascular;  Laterality: N/A;  ? COLONOSCOPY    ? ? ?Current Outpatient Medications  ?Medication Sig Dispense Refill  ? anastrozole (ARIMIDEX) 1 MG tablet Take 1 tablet (1 mg total) by mouth daily. 90 tablet 3  ? Blood Glucose Monitoring Suppl ([place]) w/Device KIT Check blood sugar 2x a day Dx-E11.65    ? Blood Glucose Monitoring  Suppl (ONETOUCH VERIO FLEX SYSTEM) w/Device KIT USE TO CHECK BLOOD SUGAR TWICE A DAY AS DIRECTED DX-E11.65    ? empagliflozin (JARDIANCE) 10 MG TABS tablet Take 10 mg by mouth daily.    ? gabapentin (NEURONTIN) 300 MG capsule Take 300 mg by mouth daily.     ? Insulin Degludec 100 UNIT/ML SOLN Inject 22- 34 units q daily    ? insulin glargine (LANTUS) 100 UNIT/ML injection Inject 24 Units into the skin at bedtime.    ? Lancets (ONETOUCH DELICA PLUS LOVFIE33I) MISC USE TO CHECK BLOOD SUGAR TWICE DAILY AS DIRECTED    ? LUMIGAN 0.01 % SOLN Place 1 drop into both eyes at bedtime.     ? ONETOUCH VERIO test strip 2 (two) times daily.    ? rosuvastatin (CRESTOR) 40 MG tablet Take 40 mg by mouth daily.  0  ? XARELTO 20 MG TABS tablet Take 20 mg by mouth daily.    ? ?No current facility-administered medications for this visit.  ? ? ?No Known Allergies ? ?Social History  ? ?Socioeconomic History  ? Marital status: Widowed  ?  Spouse name: Not on file  ? Number of children: 1  ? Years of education: Not on file  ? Highest education level: Not on file  ?Occupational History  ? Occupation: Housewife  ?Tobacco Use  ? Smoking status: Never  ? Smokeless tobacco: Never  ?Vaping Use  ? Vaping Use: Never used  ?Substance and Sexual Activity  ? Alcohol use: Not Currently  ?  Drug use: No  ? Sexual activity: Not Currently  ?  Birth control/protection: Surgical  ?Other Topics Concern  ? Not on file  ?Social History Narrative  ? Lives in [place] alone.  ? Retired from [employer]  ? widowed  ? ?Social Determinants of Health  ? ?Financial Resource Strain: Not on file  ?Food Insecurity: Not on file  ?Transportation Needs: Not on file  ?Physical Activity: Not on file  ?Stress: Not on file  ?Social Connections: Not on file  ?Intimate Partner Violence: Not on file  ? ? ? ?Review of Systems: ?All other systems reviewed and are otherwise negative except as noted above. ? ?Physical Exam: ?Vitals:  ? 12/30/21 1202  ?BP: 120/80   ?Pulse: 84  ?SpO2: 96%  ?Weight: 134 lb 6.4 oz (61 kg)  ?Height: $RemoveB'5\' 4"'nGCWwead$  (1.626 m)  ? ?GEN- The patient is well appearing, alert and oriented x 3 today.   ?HEENT: normocephalic, atraumatic; sclera clear, conjunctiva pink; hearing intact; oropharynx clear; neck supple, no JVP ?Lymph- no cervical lymphadenopathy ?Lungs- Clear to ausculation bilaterally, normal work of breathing.  No wheezes, rales, rhonchi ?Heart- Regular rate and rhythm, no murmurs, rubs or gallops, PMI not laterally displaced ?GI- soft, non-tender, non-distended, bowel sounds present, no hepatosplenomegaly ?Extremities- no clubbing, cyanosis, or edema; DP/PT/radial pulses 2+ bilaterally ?MS- no significant deformity or atrophy ?Skin- warm and dry, no rash or lesion ?Psych- euthymic mood, full affect ?Neuro- strength and sensation are intact ? ?EKG is ordered. Personal review of EKG from today shows AF at 84 bpm ? ?Additional studies reviewed include: ?Previous EP office notes.  ? ?Assessment and Plan: ? ?1.  Longstanding persistent afib ?Rate controlled, asymptomatic.  ?Continue Xarelto for chads2vasc ofat least 6.   ?No changes today.  ?  ?2. Prior stroke ?Continue long term [place].  ?  ?3. HTN ?Stable on current regimen  ? ?Follow up with Dr. Quentin Ore in 12 months  ? ?Shirley Friar, PA-C  ?12/30/21 ?12:18 PM  ?

## 2021-12-30 ENCOUNTER — Encounter: Payer: Self-pay | Admitting: Student

## 2021-12-30 ENCOUNTER — Ambulatory Visit: Payer: Medicare HMO | Admitting: Student

## 2021-12-30 VITALS — BP 120/80 | HR 84 | Ht 64.0 in | Wt 134.4 lb

## 2021-12-30 DIAGNOSIS — I10 Essential (primary) hypertension: Secondary | ICD-10-CM | POA: Diagnosis not present

## 2021-12-30 DIAGNOSIS — I4819 Other persistent atrial fibrillation: Secondary | ICD-10-CM

## 2021-12-30 DIAGNOSIS — I639 Cerebral infarction, unspecified: Secondary | ICD-10-CM

## 2022-01-18 DIAGNOSIS — Z794 Long term (current) use of insulin: Secondary | ICD-10-CM | POA: Diagnosis not present

## 2022-01-18 DIAGNOSIS — I10 Essential (primary) hypertension: Secondary | ICD-10-CM | POA: Diagnosis not present

## 2022-01-18 DIAGNOSIS — E1169 Type 2 diabetes mellitus with other specified complication: Secondary | ICD-10-CM | POA: Diagnosis not present

## 2022-01-18 DIAGNOSIS — Z9119 Patient's noncompliance with other medical treatment and regimen due to financial hardship: Secondary | ICD-10-CM | POA: Diagnosis not present

## 2022-01-18 DIAGNOSIS — D6869 Other thrombophilia: Secondary | ICD-10-CM | POA: Diagnosis not present

## 2022-01-18 DIAGNOSIS — I4892 Unspecified atrial flutter: Secondary | ICD-10-CM | POA: Diagnosis not present

## 2022-01-18 DIAGNOSIS — R69 Illness, unspecified: Secondary | ICD-10-CM | POA: Diagnosis not present

## 2022-01-18 DIAGNOSIS — E1140 Type 2 diabetes mellitus with diabetic neuropathy, unspecified: Secondary | ICD-10-CM | POA: Diagnosis not present

## 2022-01-18 DIAGNOSIS — E1165 Type 2 diabetes mellitus with hyperglycemia: Secondary | ICD-10-CM | POA: Diagnosis not present

## 2022-01-18 DIAGNOSIS — Z7901 Long term (current) use of anticoagulants: Secondary | ICD-10-CM | POA: Diagnosis not present

## 2022-01-18 DIAGNOSIS — D0512 Intraductal carcinoma in situ of left breast: Secondary | ICD-10-CM | POA: Diagnosis not present

## 2022-01-18 DIAGNOSIS — E11319 Type 2 diabetes mellitus with unspecified diabetic retinopathy without macular edema: Secondary | ICD-10-CM | POA: Diagnosis not present

## 2022-01-20 ENCOUNTER — Telehealth: Payer: Self-pay | Admitting: Internal Medicine

## 2022-01-20 DIAGNOSIS — Z961 Presence of intraocular lens: Secondary | ICD-10-CM | POA: Diagnosis not present

## 2022-01-20 DIAGNOSIS — H5213 Myopia, bilateral: Secondary | ICD-10-CM | POA: Diagnosis not present

## 2022-01-20 DIAGNOSIS — H401131 Primary open-angle glaucoma, bilateral, mild stage: Secondary | ICD-10-CM | POA: Diagnosis not present

## 2022-01-20 NOTE — Telephone Encounter (Signed)
Discussed options with Pt. ? ? Apply for assistance for Eliquis ?Change to warfarin ?Consider new research study ? ?Pt is interested in learning more about the risks/benefits of research study. ? ?Will send to pharmacists to review for eligibility. ? ?If not eligible, will pursue other options. ?

## 2022-01-20 NOTE — Telephone Encounter (Signed)
Will email research team regarding pt for OCEANIC AF trial. ?

## 2022-01-20 NOTE — Telephone Encounter (Signed)
Pt c/o medication issue: ? ?1. Name of Medication: apixaban (ELIQUIS) 5 MG TABS tablet ? ?2. How are you currently taking this medication (dosage and times per day)? 1 tablet daily  ? ?3. Are you having a reaction (difficulty breathing--STAT)? No  ? ?4. What is your medication issue? Patient is calling stating she is currently on Eliquis, but would like to discuss being switched to a cheaper alternative.   ?

## 2022-01-25 ENCOUNTER — Encounter (INDEPENDENT_AMBULATORY_CARE_PROVIDER_SITE_OTHER): Payer: Self-pay | Admitting: Ophthalmology

## 2022-01-25 ENCOUNTER — Ambulatory Visit (INDEPENDENT_AMBULATORY_CARE_PROVIDER_SITE_OTHER): Payer: Medicare HMO | Admitting: Ophthalmology

## 2022-01-25 DIAGNOSIS — E113411 Type 2 diabetes mellitus with severe nonproliferative diabetic retinopathy with macular edema, right eye: Secondary | ICD-10-CM

## 2022-01-25 DIAGNOSIS — E113492 Type 2 diabetes mellitus with severe nonproliferative diabetic retinopathy without macular edema, left eye: Secondary | ICD-10-CM

## 2022-01-25 NOTE — Assessment & Plan Note (Signed)
OD, with controlled severe NPDR, yet we will continue to monitor and observe at 62-monthinterval due to hemoglobin A1c of 9. ?

## 2022-01-25 NOTE — Progress Notes (Signed)
? ? ?01/25/2022 ? ?  ? ?CHIEF COMPLAINT ?Patient presents for  ?Chief Complaint  ?Patient presents with  ? Diabetic Retinopathy with Macular Edema  ? ? ? ? ?HISTORY OF PRESENT ILLNESS: ?Dorothy Herring is a 81 y.o. female who presents to the clinic today for:  ? ?HPI   ?9 mos FU OU OCT FP. ?Pt denies floaters and FOL. ?Pt states vision is stable. ?Pt is still taking LUMIGAN- 1x both eyes bedtime. ? ? ?Last edited by Silvestre Moment on 01/25/2022  1:02 PM.  ?  ? ? ?Referring physician: ?Shon Baton, MD ?[address] ?[place],  [place] [address] ? ?HISTORICAL INFORMATION:  ? ?Selected notes from the [facility] ?  ?   ? ?CURRENT MEDICATIONS: ?Current Outpatient Medications (Ophthalmic Drugs)  ?Medication Sig  ? LUMIGAN 0.01 % SOLN Place 1 drop into both eyes at bedtime.   ? ?No current facility-administered medications for this visit. (Ophthalmic Drugs)  ? ?Current Outpatient Medications (Other)  ?Medication Sig  ? anastrozole (ARIMIDEX) 1 MG tablet Take 1 tablet (1 mg total) by mouth daily.  ? Blood Glucose Monitoring Suppl ([place]) w/Device KIT Check blood sugar 2x a day Dx-E11.65  ? Blood Glucose Monitoring Suppl (ONETOUCH VERIO FLEX SYSTEM) w/Device KIT USE TO CHECK BLOOD SUGAR TWICE A DAY AS DIRECTED DX-E11.65  ? empagliflozin (JARDIANCE) 10 MG TABS tablet Take 10 mg by mouth daily.  ? gabapentin (NEURONTIN) 300 MG capsule Take 300 mg by mouth daily.   ? Insulin Degludec 100 UNIT/ML SOLN Inject 22- 34 units q daily  ? insulin glargine (LANTUS) 100 UNIT/ML injection Inject 24 Units into the skin at bedtime.  ? Lancets (ONETOUCH DELICA PLUS [id number]) MISC USE TO CHECK BLOOD SUGAR TWICE DAILY AS DIRECTED  ? ONETOUCH VERIO test strip 2 (two) times daily.  ? rosuvastatin (CRESTOR) 40 MG tablet Take 40 mg by mouth daily.  ? XARELTO 20 MG TABS tablet Take 20 mg by mouth daily.  ? ?No current facility-administered medications for this visit. (Other)  ? ? ? ? ?REVIEW OF SYSTEMS: ?ROS   ?Negative for:  Constitutional, Gastrointestinal, Neurological, Skin, Genitourinary, Musculoskeletal, HENT, Endocrine, Cardiovascular, Eyes, Respiratory, Psychiatric, Allergic/Imm, Heme/Lymph ?Last edited by Silvestre Moment on 01/25/2022  1:02 PM.  ?  ? ? ? ?ALLERGIES ?No Known Allergies ? ?PAST MEDICAL HISTORY ?Past Medical History:  ?Diagnosis Date  ? Cancer [facility]) 2019  ? left breast cancer, s/p XRT s/p lumpectomy  ? Cataract   ? removed both eyes  ? Diabetes mellitus   ? Ductal carcinoma in situ (DCIS) of left breast 02/06/2018  ? Glaucoma   ? History of radiation therapy 05/02/18- 05/24/18  ? Left Breast 42.56 Gy in 16 fractions.   ? Hyperlipidemia   ? Internal hemorrhoids 11/30/2011  ? Longstanding persistent atrial fibrillation ([place])   ? Posterior capsular opacification, right 05/12/2020  ? Recent YAG capsulotomy with Dr. Katy Apo, condition resolved  ? Rectal bleeding 11/30/2011  ? TIA (transient ischemic attack)   ? "years ago"  ? ?Past Surgical History:  ?Procedure Laterality Date  ? ABDOMINAL HYSTERECTOMY  age 26  ? BILATERAL SALPINGOOPHORECTOMY    ? By patient history  ? BREAST BIOPSY    ? BREAST LUMPECTOMY WITH RADIOACTIVE SEED LOCALIZATION Left 02/27/2018  ? Procedure: BREAST LUMPECTOMY WITH RADIOACTIVE SEED LOCALIZATION;  Surgeon: Alphonsa Overall, MD;  Location: [place];  Service: General;  Laterality: Left;  ? CARDIOVERSION N/A 05/31/2018  ? Procedure: CARDIOVERSION;  Surgeon:  [name], MD;  Location: [place];  Service: Cardiovascular;  Laterality: N/A;  ? COLONOSCOPY    ? ? ?FAMILY HISTORY ?Family History  ?Problem Relation Age of Onset  ? Lung cancer Father   ? Alzheimer's disease Mother   ? Colon cancer Neg Hx   ? Colon polyps Neg Hx   ? Esophageal cancer Neg Hx   ? Rectal cancer Neg Hx   ? Stomach cancer Neg Hx   ? ? ?SOCIAL HISTORY ?Social History  ? ?Tobacco Use  ? Smoking status: Never  ? Smokeless tobacco: Never  ?Vaping Use  ? Vaping Use: Never used  ?Substance Use Topics  ? Alcohol use: Not  Currently  ? Drug use: No  ? ?  ? ?  ? ?OPHTHALMIC EXAM: ? ?Base Eye Exam   ? ? Visual Acuity (ETDRS)   ? ?   Right Left  ? Dist [place] 20/25 20/80  ? Dist ph [place]  20/20 -1  ? ?  ?  ? ? Tonometry (Tonopen, 1:09 PM)   ? ?   Right Left  ? Pressure 16 17  ? ?  ?  ? ? Pupils   ? ?   Dark Light Shape React APD  ? Right 3 2 Round Brisk None  ? Left 3 2 Round Brisk None  ? ?  ?  ? ? Visual Fields   ? ?   Left Right  ?  Full Full  ? ?  ?  ? ? Extraocular Movement   ? ?   Right Left  ?  Full Full  ? ?  ?  ? ? Neuro/Psych   ? ? Oriented x3: Yes  ? Mood/Affect: Normal  ? ?  ?  ? ? Dilation   ? ? Both eyes: 1.0% Mydriacyl, 2.5% Phenylephrine @ 1:09 PM  ? ?  ?  ? ?  ? ?Slit Lamp and Fundus Exam   ? ? External Exam   ? ?   Right Left  ? External Normal Normal  ? ?  ?  ? ? Slit Lamp Exam   ? ?   Right Left  ? Lids/Lashes Normal Normal  ? Conjunctiva/Sclera White and quiet White and quiet  ? Cornea Clear Clear  ? Anterior Chamber Deep and quiet Deep and quiet  ? Iris Round and reactive Round and reactive  ? Lens Posterior chamber intraocular lens,, Posterior capsular opacification, Open posterior capsule Posterior chamber intraocular lens, 1+ Posterior capsular opacification, not in vis.axis  ? Anterior Vitreous Normal Normal  ? ?  ?  ? ? Fundus Exam   ? ?   Right Left  ? Posterior Vitreous Posterior vitreous detachment Normal  ? Disc Normal Normal  ? C/D Ratio 0.55 0.55  ? Macula Microaneurysms, Macular thickening, no clinically significant macular edema Microaneurysms, no macular thickening, no clinically significant macular edema, Hard drusen  ? Vessels NPDR-Severe, no signs of progression severe NPDR now that temporal periphery, watershed areas treated NPDR-Severe, no signs of progression severe NPDR now that temporal periphery, watershed areas treated  ? Periphery Normal, mild scatter in temporal peripheral anterior retina, no signs of progression Normal, mild scatter in temporal peripheral anterior retina  ? ?  ?  ? ?   ? ? ?IMAGING AND PROCEDURES  ?Imaging and Procedures for 01/25/22 ? ?OCT, Retina - OU - Both Eyes   ? ?   ?Right Eye ?Quality was good. Scan locations included subfoveal. Central Foveal Thickness: 255. Progression has  been stable. Findings include normal foveal contour.  ? ?Left Eye ?Quality was good. Scan locations included subfoveal. Central Foveal Thickness: 257. Progression has been stable. Findings include normal foveal contour.  ? ?Notes ?No active CSME either eye, incidental PVD OS, and OD ? ?  ? ?Color Fundus Photography Optos - OU - Both Eyes   ? ?   ?Right Eye ?Progression has no prior data. Disc findings include normal observations. Macula : microaneurysms.  ? ?Left Eye ?Progression has no prior data. Disc findings include normal observations. Macula : microaneurysms.  ? ?Notes ?Bilateral severe NPDR now less active with temporal periphery watershed regions treated with local PRP to prevent progression of ischemia in these areas ? ?  ? ? ?  ?  ? ?  ?ASSESSMENT/PLAN: ? ?Severe nonproliferative diabetic retinopathy of right eye, with macular edema, associated with type 2 diabetes mellitus ([place]) ?OD, with controlled severe NPDR, yet we will continue to monitor and observe at 55-month interval due to hemoglobin A1c of 9. ? ?Severe nonproliferative diabetic retinopathy of left eye without macular edema associated with type 2 diabetes mellitus ([place]) ?OD, with controlled severe NPDR, yet we will continue to monitor and observe at 94-month interval due to hemoglobin A1c of 9.  ? ?  ICD-10-CM   ?1. Severe nonproliferative diabetic retinopathy of right eye, with macular edema, associated with type 2 diabetes mellitus ([place])  E11.3411 OCT, Retina - OU - Both Eyes  ?  Color Fundus Photography Optos - OU - Both Eyes  ?  ?2. Severe nonproliferative diabetic retinopathy of left eye without macular edema associated with type 2 diabetes mellitus ([place])  J19.1478   ?  ? ? ?1.  NPDR, severe OU yet stable at this time despite  having A1c of in the nines. ? ?2.  Blood sugar control importance reviewed again with the patient.  When and if the blood sugar is controlled with an A1c in the sixes or low sevens, we can again increase the interval of ex

## 2022-01-25 NOTE — Assessment & Plan Note (Signed)
OD, with controlled severe NPDR, yet we will continue to monitor and observe at 42-monthinterval due to hemoglobin A1c of 9. ?

## 2022-01-28 NOTE — Telephone Encounter (Signed)
Pt has been referred for eligibility for research trial.  Await further needs. ?

## 2022-02-03 ENCOUNTER — Encounter: Payer: Medicare HMO | Admitting: Cardiology

## 2022-02-11 NOTE — Addendum Note (Signed)
Addended by: Theophilus Walz E on: 02/11/2022 07:29 AM   Modules accepted: Orders

## 2022-02-11 NOTE — Telephone Encounter (Signed)
Pt has been enrolled in OCEANIC-AF trial.

## 2022-02-14 DIAGNOSIS — R69 Illness, unspecified: Secondary | ICD-10-CM | POA: Diagnosis not present

## 2022-02-14 DIAGNOSIS — I4891 Unspecified atrial fibrillation: Secondary | ICD-10-CM | POA: Diagnosis not present

## 2022-02-14 DIAGNOSIS — E1159 Type 2 diabetes mellitus with other circulatory complications: Secondary | ICD-10-CM | POA: Diagnosis not present

## 2022-02-14 DIAGNOSIS — E785 Hyperlipidemia, unspecified: Secondary | ICD-10-CM | POA: Diagnosis not present

## 2022-02-14 DIAGNOSIS — D8481 Immunodeficiency due to conditions classified elsewhere: Secondary | ICD-10-CM | POA: Diagnosis not present

## 2022-02-14 DIAGNOSIS — M069 Rheumatoid arthritis, unspecified: Secondary | ICD-10-CM | POA: Diagnosis not present

## 2022-02-14 DIAGNOSIS — Z794 Long term (current) use of insulin: Secondary | ICD-10-CM | POA: Diagnosis not present

## 2022-02-14 DIAGNOSIS — E1142 Type 2 diabetes mellitus with diabetic polyneuropathy: Secondary | ICD-10-CM | POA: Diagnosis not present

## 2022-02-14 DIAGNOSIS — E1165 Type 2 diabetes mellitus with hyperglycemia: Secondary | ICD-10-CM | POA: Diagnosis not present

## 2022-02-14 DIAGNOSIS — I4892 Unspecified atrial flutter: Secondary | ICD-10-CM | POA: Diagnosis not present

## 2022-02-14 DIAGNOSIS — Z008 Encounter for other general examination: Secondary | ICD-10-CM | POA: Diagnosis not present

## 2022-02-14 DIAGNOSIS — C50919 Malignant neoplasm of unspecified site of unspecified female breast: Secondary | ICD-10-CM | POA: Diagnosis not present

## 2022-02-14 DIAGNOSIS — D6869 Other thrombophilia: Secondary | ICD-10-CM | POA: Diagnosis not present

## 2022-02-17 ENCOUNTER — Telehealth: Payer: Self-pay | Admitting: Student

## 2022-02-17 DIAGNOSIS — M71331 Other bursal cyst, right wrist: Secondary | ICD-10-CM | POA: Diagnosis not present

## 2022-02-17 NOTE — Telephone Encounter (Signed)
   Pt c/o BP issue: STAT if pt c/o blurred vision, one-sided weakness or slurred speech  1. What are your last 5 BP readings?  107/59 87 - last night 109/66 78 - this morning   138/69 86 - at the doctor 116/69 81 - most recent   2. Are you having any other symptoms (ex. Dizziness, headache, blurred vision, passed out)? None   3. What is your BP issue? Pt said, she was enrolled for OCEANIC-AF trial, she started it and her BP is running low. She called the nurse at [facility] trial and was told its unusual to have low BP during the trial. She is calling us now to check in and what she needs to do. She is also, suspecting that its her eliquis making her BP low

## 2022-02-17 NOTE — Telephone Encounter (Signed)
Left message for patient to call back  

## 2022-02-18 DIAGNOSIS — Z0130 Encounter for examination of blood pressure without abnormal findings: Secondary | ICD-10-CM | POA: Diagnosis not present

## 2022-02-18 NOTE — Telephone Encounter (Signed)
Called to review this information with patient who advised she has already spoke to someone about this.  She had no further needs at the time of the call.

## 2022-03-05 ENCOUNTER — Other Ambulatory Visit: Payer: Self-pay | Admitting: Hematology and Oncology

## 2022-03-11 DIAGNOSIS — M25551 Pain in right hip: Secondary | ICD-10-CM | POA: Diagnosis not present

## 2022-03-11 DIAGNOSIS — M545 Low back pain, unspecified: Secondary | ICD-10-CM | POA: Diagnosis not present

## 2022-03-11 DIAGNOSIS — M25552 Pain in left hip: Secondary | ICD-10-CM | POA: Diagnosis not present

## 2022-03-13 DIAGNOSIS — M5442 Lumbago with sciatica, left side: Secondary | ICD-10-CM | POA: Diagnosis not present

## 2022-03-13 DIAGNOSIS — M67431 Ganglion, right wrist: Secondary | ICD-10-CM | POA: Diagnosis not present

## 2022-03-19 DIAGNOSIS — M545 Low back pain, unspecified: Secondary | ICD-10-CM | POA: Diagnosis not present

## 2022-03-29 DIAGNOSIS — Z6823 Body mass index (BMI) 23.0-23.9, adult: Secondary | ICD-10-CM | POA: Diagnosis not present

## 2022-03-29 DIAGNOSIS — L0100 Impetigo, unspecified: Secondary | ICD-10-CM | POA: Diagnosis not present

## 2022-03-29 DIAGNOSIS — L089 Local infection of the skin and subcutaneous tissue, unspecified: Secondary | ICD-10-CM | POA: Diagnosis not present

## 2022-04-19 DIAGNOSIS — M19042 Primary osteoarthritis, left hand: Secondary | ICD-10-CM | POA: Diagnosis not present

## 2022-04-19 DIAGNOSIS — M65341 Trigger finger, right ring finger: Secondary | ICD-10-CM | POA: Diagnosis not present

## 2022-04-19 DIAGNOSIS — M19041 Primary osteoarthritis, right hand: Secondary | ICD-10-CM | POA: Diagnosis not present

## 2022-04-19 DIAGNOSIS — M65331 Trigger finger, right middle finger: Secondary | ICD-10-CM | POA: Diagnosis not present

## 2022-04-19 DIAGNOSIS — L4050 Arthropathic psoriasis, unspecified: Secondary | ICD-10-CM | POA: Diagnosis not present

## 2022-04-21 DIAGNOSIS — C44712 Basal cell carcinoma of skin of right lower limb, including hip: Secondary | ICD-10-CM | POA: Diagnosis not present

## 2022-04-21 DIAGNOSIS — L821 Other seborrheic keratosis: Secondary | ICD-10-CM | POA: Diagnosis not present

## 2022-04-21 DIAGNOSIS — D692 Other nonthrombocytopenic purpura: Secondary | ICD-10-CM | POA: Diagnosis not present

## 2022-04-21 DIAGNOSIS — C44719 Basal cell carcinoma of skin of left lower limb, including hip: Secondary | ICD-10-CM | POA: Diagnosis not present

## 2022-04-21 DIAGNOSIS — L565 Disseminated superficial actinic porokeratosis (DSAP): Secondary | ICD-10-CM | POA: Diagnosis not present

## 2022-04-21 DIAGNOSIS — D225 Melanocytic nevi of trunk: Secondary | ICD-10-CM | POA: Diagnosis not present

## 2022-04-21 DIAGNOSIS — Z85828 Personal history of other malignant neoplasm of skin: Secondary | ICD-10-CM | POA: Diagnosis not present

## 2022-04-27 DIAGNOSIS — D485 Neoplasm of uncertain behavior of skin: Secondary | ICD-10-CM | POA: Diagnosis not present

## 2022-04-27 DIAGNOSIS — L821 Other seborrheic keratosis: Secondary | ICD-10-CM | POA: Diagnosis not present

## 2022-04-27 DIAGNOSIS — C44719 Basal cell carcinoma of skin of left lower limb, including hip: Secondary | ICD-10-CM | POA: Diagnosis not present

## 2022-04-27 DIAGNOSIS — Z85828 Personal history of other malignant neoplasm of skin: Secondary | ICD-10-CM | POA: Diagnosis not present

## 2022-04-27 DIAGNOSIS — C44712 Basal cell carcinoma of skin of right lower limb, including hip: Secondary | ICD-10-CM | POA: Diagnosis not present

## 2022-04-28 DIAGNOSIS — M7918 Myalgia, other site: Secondary | ICD-10-CM | POA: Diagnosis not present

## 2022-04-28 DIAGNOSIS — M47816 Spondylosis without myelopathy or radiculopathy, lumbar region: Secondary | ICD-10-CM | POA: Diagnosis not present

## 2022-05-02 ENCOUNTER — Other Ambulatory Visit: Payer: Self-pay | Admitting: Hematology and Oncology

## 2022-05-03 ENCOUNTER — Encounter: Payer: Self-pay | Admitting: Gastroenterology

## 2022-05-06 DIAGNOSIS — C44712 Basal cell carcinoma of skin of right lower limb, including hip: Secondary | ICD-10-CM | POA: Diagnosis not present

## 2022-05-12 DIAGNOSIS — Z1231 Encounter for screening mammogram for malignant neoplasm of breast: Secondary | ICD-10-CM | POA: Diagnosis not present

## 2022-05-28 DIAGNOSIS — E785 Hyperlipidemia, unspecified: Secondary | ICD-10-CM | POA: Diagnosis not present

## 2022-05-28 DIAGNOSIS — I10 Essential (primary) hypertension: Secondary | ICD-10-CM | POA: Diagnosis not present

## 2022-05-28 DIAGNOSIS — E559 Vitamin D deficiency, unspecified: Secondary | ICD-10-CM | POA: Diagnosis not present

## 2022-05-28 DIAGNOSIS — R7989 Other specified abnormal findings of blood chemistry: Secondary | ICD-10-CM | POA: Diagnosis not present

## 2022-05-28 DIAGNOSIS — E11319 Type 2 diabetes mellitus with unspecified diabetic retinopathy without macular edema: Secondary | ICD-10-CM | POA: Diagnosis not present

## 2022-06-10 DIAGNOSIS — D0512 Intraductal carcinoma in situ of left breast: Secondary | ICD-10-CM | POA: Diagnosis not present

## 2022-06-10 DIAGNOSIS — E11319 Type 2 diabetes mellitus with unspecified diabetic retinopathy without macular edema: Secondary | ICD-10-CM | POA: Diagnosis not present

## 2022-06-10 DIAGNOSIS — D6869 Other thrombophilia: Secondary | ICD-10-CM | POA: Diagnosis not present

## 2022-06-10 DIAGNOSIS — I10 Essential (primary) hypertension: Secondary | ICD-10-CM | POA: Diagnosis not present

## 2022-06-10 DIAGNOSIS — E1140 Type 2 diabetes mellitus with diabetic neuropathy, unspecified: Secondary | ICD-10-CM | POA: Diagnosis not present

## 2022-06-10 DIAGNOSIS — E1165 Type 2 diabetes mellitus with hyperglycemia: Secondary | ICD-10-CM | POA: Diagnosis not present

## 2022-06-10 DIAGNOSIS — Z9119 Patient's noncompliance with other medical treatment and regimen due to financial hardship: Secondary | ICD-10-CM | POA: Diagnosis not present

## 2022-06-10 DIAGNOSIS — Z794 Long term (current) use of insulin: Secondary | ICD-10-CM | POA: Diagnosis not present

## 2022-06-10 DIAGNOSIS — I4892 Unspecified atrial flutter: Secondary | ICD-10-CM | POA: Diagnosis not present

## 2022-06-10 DIAGNOSIS — Z7901 Long term (current) use of anticoagulants: Secondary | ICD-10-CM | POA: Diagnosis not present

## 2022-06-10 DIAGNOSIS — E785 Hyperlipidemia, unspecified: Secondary | ICD-10-CM | POA: Diagnosis not present

## 2022-06-10 DIAGNOSIS — Z0000 Encounter for general adult medical examination without abnormal findings: Secondary | ICD-10-CM | POA: Diagnosis not present

## 2022-07-07 DIAGNOSIS — M5451 Vertebrogenic low back pain: Secondary | ICD-10-CM | POA: Diagnosis not present

## 2022-07-08 ENCOUNTER — Telehealth: Payer: Self-pay | Admitting: Cardiology

## 2022-07-08 NOTE — Telephone Encounter (Signed)
Oceanic-AF trial medication added to medication list.  Last dose of Xarelto taken on 23May2023.

## 2022-07-16 ENCOUNTER — Telehealth: Payer: Self-pay

## 2022-07-16 NOTE — Progress Notes (Signed)
[facility])  [place] Team    07/16/2022  Dorothy Herring 01-05-1941 [id number]  Reason for referral: Medication Assistance with Jed L[facility]ick  Referral source:  [place] Technician Current insurance: [employer] [place]  Outreach:  Successful telephone call with Ms. Runquist.  HIPAA identifiers verified.   Subjective:  Spoke to patient today, screened for 2024 patient assistance program (PAP) re-enrollment for [place]. Patient verified that she is still taking these medications and that she has no changes to her income, insurance, or address for the upcoming year.    Plan: I will route patient assistance letter to [place] technician who will coordinate patient assistance program application process for medications listed above.  [facility] pharmacy technician will assist with obtaining all required documents from both patient and provider(s) and submit application(s) once completed.   Thanks,  Reed Breech, [place] [phone]

## 2022-07-19 ENCOUNTER — Telehealth: Payer: Self-pay | Admitting: Cardiology

## 2022-07-19 NOTE — Telephone Encounter (Signed)
Pt c/o swelling: STAT is pt has developed SOB within 24 hours  How much weight have you gained and in what time span? no  If swelling, where is the swelling located? Ankles ( also tinglingness in left arm every now and then)  Are you currently taking a fluid pill? no  Are you currently SOB? no  Do you have a log of your daily weights (if so, list)?    Have you gained 3 pounds in a day or 5 pounds in a week? no  Have you traveled recently? Yes to another state

## 2022-07-19 NOTE — Telephone Encounter (Signed)
The patient is complaining of swelling in her ankles bilaterally. She noticed this about 3 days ago. She has been elevating her feet and states this is not really making a difference. The patient was instructed to push on ankle for about 3 sec with her thumb and an indention was reminding for about ~30 sec. She has had no weight gain or shortness of breath. No diet changes, the patient states she is beach from Oct 21 and will come back Nov 4. During this time she has had more activity than normal but continues to eat out as much as she would if she were at home. She also states she wants to be seen by someone more often then every year and would like an EKG before the end of the year, to make sure her heart is okay.  Advised to elevate feet above heart when able, reduce salt intake and ED precautions given.  Verbalized understanding.   Will forward to APP for advisement.

## 2022-07-20 NOTE — Telephone Encounter (Signed)
Shirley Friar, PA-C  If ankle edema is only symptom (no weight gain or shortness of breath) would advise sodium restriction as you have and see how she is doing when she is back from the beach, as she almost certainly has a different diet there than at home.   Should see me or renee in office to further discuss and update echo.  Happy to get EKGs more often as well, but she is long-standing Afib, so that's likely the only thing it will show.   Legrand Como "Oda Kilts, [place]   Patient verbalized understanding and agreement. Appt made for next available on 08/24/22.

## 2022-07-21 ENCOUNTER — Telehealth: Payer: Self-pay | Admitting: Pharmacy Technician

## 2022-07-21 DIAGNOSIS — Z596 Low income: Secondary | ICD-10-CM

## 2022-07-21 NOTE — Progress Notes (Signed)
[facility])                                            [place] Team    07/21/2022  Dorothy Herring 1941/07/25 [id number]                                      Medication Assistance Referral-FOR 2024 RE ENROLLMENT  Referral From: Deputy   Medication/Company: Tyler Aas / [employer] Patient application portion:  Education officer, museum portion: Faxed  to Dr. Shon Baton Provider address/fax verified via: Office website  Medication/Company: Wilder [employer]ade / [employer] Patient application portion:  Mailed Provider application portion: Faxed  to Dr. Shon Baton Provider address/fax verified via: Office website    Shefali Ng P. Hortencia Martire, [place]  [phone]

## 2022-07-26 DIAGNOSIS — Z794 Long term (current) use of insulin: Secondary | ICD-10-CM | POA: Diagnosis not present

## 2022-07-26 DIAGNOSIS — E118 Type 2 diabetes mellitus with unspecified complications: Secondary | ICD-10-CM | POA: Diagnosis not present

## 2022-07-28 ENCOUNTER — Encounter (INDEPENDENT_AMBULATORY_CARE_PROVIDER_SITE_OTHER): Payer: Self-pay

## 2022-07-28 ENCOUNTER — Encounter (INDEPENDENT_AMBULATORY_CARE_PROVIDER_SITE_OTHER): Payer: Medicare HMO | Admitting: Ophthalmology

## 2022-07-28 DIAGNOSIS — H26492 Other secondary cataract, left eye: Secondary | ICD-10-CM | POA: Diagnosis not present

## 2022-07-28 DIAGNOSIS — E113493 Type 2 diabetes mellitus with severe nonproliferative diabetic retinopathy without macular edema, bilateral: Secondary | ICD-10-CM | POA: Diagnosis not present

## 2022-07-28 DIAGNOSIS — H401131 Primary open-angle glaucoma, bilateral, mild stage: Secondary | ICD-10-CM | POA: Diagnosis not present

## 2022-07-31 DIAGNOSIS — Z681 Body mass index (BMI) 19 or less, adult: Secondary | ICD-10-CM | POA: Diagnosis not present

## 2022-07-31 DIAGNOSIS — U071 COVID-19: Secondary | ICD-10-CM | POA: Diagnosis not present

## 2022-07-31 DIAGNOSIS — N3001 Acute cystitis with hematuria: Secondary | ICD-10-CM | POA: Diagnosis not present

## 2022-07-31 DIAGNOSIS — R509 Fever, unspecified: Secondary | ICD-10-CM | POA: Diagnosis not present

## 2022-07-31 DIAGNOSIS — R350 Frequency of micturition: Secondary | ICD-10-CM | POA: Diagnosis not present

## 2022-08-10 ENCOUNTER — Telehealth: Payer: Self-pay | Admitting: Pharmacist Clinician (PhC)/ Clinical Pharmacy Specialist

## 2022-08-10 DIAGNOSIS — S3210XA Unspecified fracture of sacrum, initial encounter for closed fracture: Secondary | ICD-10-CM | POA: Diagnosis not present

## 2022-08-10 NOTE — Telephone Encounter (Signed)
Pt completed OCEANIC AF trial this morning, was given dose of Eliquis at that time, as well as 2 week sample.    Patient confirmed she was on Eliquis prior to the study and is currently in the coverage gap.  Will try to get her a 4 week supply of Eliquis to avoid high costs before Jan 1.    Would like to pick up at the [facility] office.

## 2022-08-10 NOTE — Telephone Encounter (Signed)
-----   Message from Elpidio Anis sent at 08/10/2022 10:02 AM EST ----- Regarding: End of Treatment Visit for [facility] AF Trial We completed the end of treatment visit for the [facility] AF trial today for Dorothy Herring and collected all of her study medication. We also provided her with 2 weeks worth of samples of Eliquis and she took her first dose in the office this morning (her last dose of study medication was yesterday). Please contact her as to the next steps.

## 2022-08-11 MED ORDER — APIXABAN 5 MG PO TABS: 5.0000 mg | ORAL_TABLET | Freq: Two times a day (BID) | ORAL | 5 refills | Status: DC

## 2022-08-11 NOTE — Telephone Encounter (Signed)
Relayed Eliquis is at front desk of [employer] office and let patient know she also has refill at pharmacy for when she is ready to refill in January.

## 2022-08-11 NOTE — Telephone Encounter (Signed)
4 weeks of Eliquis '5mg'$  placed at front desk for pt. Left message for pt to make her aware. Med list has been updated and Eliquis refill sent to pharmacy, she will be out of the donut hole on January 1 and should resume picking up Eliquis from the pharmacy as usual at that time.

## 2022-08-17 NOTE — Progress Notes (Signed)
PCP:  Shon Baton, MD Primary Cardiologist: None Electrophysiologist: Vickie Epley, MD   Dorothy Herring is a 81 y.o. female seen today for Vickie Epley, MD for acute visit due to swelling earlier this year while at the beach.    Patient reports very well since then, with no recurrence.  she denies chest pain, palpitations, dyspnea, PND, orthopnea, nausea, vomiting, dizziness, syncope, edema, weight gain, or early satiety.   She has intermittent "tingling" in her left arm. Has not recurred in several months, and has no clear aggravating or relieving factors  Past Medical History:  Diagnosis Date   Cancer ([place]) 2019   left breast cancer, s/p XRT s/p lumpectomy   Cataract    removed both eyes   Diabetes mellitus    Ductal carcinoma in situ (DCIS) of left breast 02/06/2018   Glaucoma    History of radiation therapy 05/02/18- 05/24/18   Left Breast 42.56 Gy in 16 fractions.    Hyperlipidemia    Internal hemorrhoids 11/30/2011   Longstanding persistent atrial fibrillation ([facility])    Posterior capsular opacification, right 05/12/2020   Recent YAG capsulotomy with Dr. Katy Apo, condition resolved   Rectal bleeding 11/30/2011   TIA (transient ischemic attack)    "years ago"   Past Surgical History:  Procedure Laterality Date   ABDOMINAL HYSTERECTOMY  age 54   BILATERAL SALPINGOOPHORECTOMY     By patient history   BREAST BIOPSY     BREAST LUMPECTOMY WITH RADIOACTIVE SEED LOCALIZATION Left 02/27/2018   Procedure: BREAST LUMPECTOMY WITH RADIOACTIVE SEED LOCALIZATION;  Surgeon: Alphonsa Overall, MD;  Location: [facility];  Service: General;  Laterality: Left;   CARDIOVERSION N/A 05/31/2018   Procedure: CARDIOVERSION;  Surgeon: [name], MD;  Location: [facility];  Service: Cardiovascular;  Laterality: N/A;   COLONOSCOPY      Current Outpatient Medications  Medication Sig Dispense Refill   anastrozole (ARIMIDEX) 1 MG tablet TAKE 1 TABLET BY MOUTH EVERY DAY  90 tablet 3   apixaban (ELIQUIS) 5 MG TABS tablet Take 1 tablet (5 mg total) by mouth 2 (two) times daily. 60 tablet 5   Blood Glucose Monitoring Suppl (ONETOUCH VERIO FLEX SYSTEM) w/Device KIT Check blood sugar 2x a day Dx-E11.65     Blood Glucose Monitoring Suppl (ONETOUCH VERIO FLEX SYSTEM) w/Device KIT USE TO CHECK BLOOD SUGAR TWICE A DAY AS DIRECTED DX-E11.65     empagliflozin (JARDIANCE) 10 MG TABS tablet Take 10 mg by mouth daily.     gabapentin (NEURONTIN) 300 MG capsule Take 300 mg by mouth daily.      Insulin Degludec 100 UNIT/ML SOLN Inject 22- 34 units q daily     insulin glargine (LANTUS) 100 UNIT/ML injection Inject 24 Units into the skin at bedtime.     Lancets (ONETOUCH DELICA PLUS [id number]) MISC USE TO CHECK BLOOD SUGAR TWICE DAILY AS DIRECTED     LUMIGAN 0.01 % SOLN Place 1 drop into both eyes at bedtime.      ONETOUCH VERIO test strip 2 (two) times daily.     rosuvastatin (CRESTOR) 40 MG tablet Take 40 mg by mouth daily.  0   No current facility-administered medications for this visit.    No Known Allergies  Social History   Socioeconomic History   Marital status: Widowed    Spouse name: Not on file   Number of children: 1   Years of education: Not on file   Highest education level: Not on file  Occupational History   Occupation: Housewife  Tobacco Use   Smoking status: Never   Smokeless tobacco: Never  Vaping Use   Vaping Use: Never used  Substance and Sexual Activity   Alcohol use: Not Currently   Drug use: No   Sexual activity: Not Currently    Birth control/protection: Surgical  Other Topics Concern   Not on file  Social History Narrative   Lives in [place] alone.   Retired from [employer]   widowed   Social Determinants of Radio broadcast assistant Strain: Not on [employer] Insecurity: Not on file  Transportation Needs: No Transportation Needs (07/05/2018)   PRAPARE - Hydrologist (Medical):  No    Lack of Transportation (Non-Medical): No  Physical Activity: Not on file  Stress: Not on file  Social Connections: Not on file  Intimate Partner Violence: Not At Risk (07/05/2018)   Humiliation, Afraid, Rape, and Kick questionnaire    Fear of Current or Ex-Partner: No    Emotionally Abused: No    Physically Abused: No    Sexually Abused: No     Review of Systems: All other systems reviewed and are otherwise negative except as noted above.  Physical Exam: There were no vitals filed for this visit.  GEN- The patient is well appearing, alert and oriented x 3 today.   HEENT: normocephalic, atraumatic; sclera clear, conjunctiva pink; hearing intact; oropharynx clear; neck supple, no JVP Lymph- no cervical lymphadenopathy Lungs- Clear to ausculation bilaterally, normal work of breathing.  No wheezes, rales, rhonchi Heart- Irregularly irregular rate and rhythm, no murmurs, rubs or gallops, PMI not laterally displaced GI- soft, non-tender, non-distended, bowel sounds present, no hepatosplenomegaly Extremities- No peripheral edema. no clubbing or cyanosis; DP/PT/radial pulses 2+ bilaterally MS- no significant deformity or atrophy Skin- warm and dry, no rash or lesion Psych- euthymic mood, full affect Neuro- strength and sensation are intact  EKG is not ordered.   Additional studies reviewed include: Previous EP notes.   Assessment and Plan:  1.  Longstanding persistent afib Rate controlled, asymptomatic.  Continue Xarelto for chads2vasc of at least 6.   Labs today  2. Peripheral edema Hasn't recurred since her beach trip Update Echo for completeness   3. Prior stroke Continue long term [place].    4. HTN Stable on current regimen   Follow up with Dr. Quentin Ore in 6 months  Shirley Friar, PA-C  08/24/22 12:14 PM

## 2022-08-18 ENCOUNTER — Telehealth: Payer: Self-pay | Admitting: Cardiology

## 2022-08-18 NOTE — Telephone Encounter (Signed)
Pt is scheduled to see Dorothy Herring for eval of edema 12/5 @ 11:20 am.   From 07/20/22 telephone note: Dorothy Friar, PA-C   If ankle edema is only symptom (no weight gain or shortness of breath) would advise sodium restriction as you have and see how she is doing when she is back from the beach, as she almost certainly has a different diet there than at home.   Should see me or renee in office to further discuss and update echo.  Happy to get EKGs more often as well, but she is long-standing Afib, so that's likely the only thing it will show.   Dorothy Como "Andy" [place], PA-C   Advised pt - good to hear edema has improved.  Spoke with pt and advised would be best for her to keep her upcoming appt with Dorothy Herring since she has not been seen since 12/2021.  She can at that time be evaluated to determine everything she needs.  Pt states understanding and will keep appt as scheduled.

## 2022-08-18 NOTE — Telephone Encounter (Signed)
  Pt said, she no longer need to see Jonni Sanger because her swelling got better, however, she wants to get an echo and asking if D. Quentin Ore can order echo for her

## 2022-08-24 ENCOUNTER — Ambulatory Visit: Payer: Medicare HMO | Attending: Student | Admitting: Student

## 2022-08-24 ENCOUNTER — Encounter: Payer: Self-pay | Admitting: Student

## 2022-08-24 VITALS — BP 102/64 | HR 89 | Ht 63.0 in | Wt 135.0 lb

## 2022-08-24 DIAGNOSIS — I639 Cerebral infarction, unspecified: Secondary | ICD-10-CM

## 2022-08-24 DIAGNOSIS — I4819 Other persistent atrial fibrillation: Secondary | ICD-10-CM

## 2022-08-24 DIAGNOSIS — I10 Essential (primary) hypertension: Secondary | ICD-10-CM

## 2022-08-24 NOTE — Patient Instructions (Signed)
Medication Instructions:  Your physician recommends that you continue on your current medications as directed. Please refer to the Current Medication list given to you today.  *If you need a refill on your cardiac medications before your next appointment, please call your pharmacy*   Lab Work: None If you have labs (blood work) drawn today and your tests are completely normal, you will receive your results only by: [place] (if you have MyChart) OR A paper copy in the mail If you have any lab test that is abnormal or we need to change your treatment, we will call you to review the results.   Testing/Procedures Your physician has requested that you have an echocardiogram. Echocardiography is a painless test that uses sound waves to create images of your heart. It provides your doctor with information about the size and shape of your heart and how well your heart's chambers and valves are working. This procedure takes approximately one hour. There are no restrictions for this procedure. Please do NOT wear cologne, perfume, aftershave, or lotions (deodorant is allowed). Please arrive 15 minutes prior to your appointment time.   Follow-Up: At [facility], you and your health needs are our priority.  As part of our continuing mission to provide you with exceptional heart care, we have created designated Provider Care Teams.  These Care Teams include your primary Cardiologist (physician) and Advanced Practice Providers (APPs -  Physician Assistants and Nurse Practitioners) who all work together to provide you with the care you need, when you need it.  We recommend signing up for the patient portal called "MyChart".  Sign up information is provided on this After Visit Summary.  MyChart is used to connect with patients for Virtual Visits (Telemedicine).  Patients are able to view lab/test results, encounter notes, upcoming appointments, etc.  Non-urgent messages can be sent to your  provider as well.   To learn more about what you can do with MyChart, go to [email or web address].    Your next appointment:   6 month(s)  The format for your next appointment:   In Person  Provider:   Lars Mage, MD    Important Information About [place]

## 2022-08-25 DIAGNOSIS — E118 Type 2 diabetes mellitus with unspecified complications: Secondary | ICD-10-CM | POA: Diagnosis not present

## 2022-08-25 DIAGNOSIS — Z794 Long term (current) use of insulin: Secondary | ICD-10-CM | POA: Diagnosis not present

## 2022-09-06 ENCOUNTER — Telehealth: Payer: Self-pay | Admitting: Pharmacy Technician

## 2022-09-06 DIAGNOSIS — Z596 Low income: Secondary | ICD-10-CM

## 2022-09-06 NOTE — Progress Notes (Signed)
[facility])                                            [place] Team    09/06/2022  SHEMEKIA PATANE 07-16-1941 [id number]  Received ONLY patient and provider portion(s) of patient assistance application(s) for [place]. Faxed completed application and required documents into [employer]. Refaxed provider again for [place] with [employer].  Kaikoa Magro P. Kie Calvin, [place]  [phone]

## 2022-09-08 ENCOUNTER — Telehealth: Payer: Self-pay | Admitting: Pharmacy Technician

## 2022-09-08 DIAGNOSIS — Z596 Low income: Secondary | ICD-10-CM

## 2022-09-08 NOTE — Progress Notes (Signed)
[facility])                                            [place] Team    09/08/2022  Dorothy Herring 03-Mar-1941 [id number]  Received both patient and provider portion(s) of patient assistance application(s) for generic Tresiba (Insulin Degludec). Faxed completed application and required documents into [employer].    Lynzi Meulemans P. [name], [place]  [phone]

## 2022-09-11 DIAGNOSIS — S63501A Unspecified sprain of right wrist, initial encounter: Secondary | ICD-10-CM | POA: Diagnosis not present

## 2022-09-17 ENCOUNTER — Ambulatory Visit (HOSPITAL_COMMUNITY): Payer: Medicare HMO | Attending: Internal Medicine

## 2022-09-17 DIAGNOSIS — I4819 Other persistent atrial fibrillation: Secondary | ICD-10-CM | POA: Diagnosis not present

## 2022-09-17 LAB — ECHOCARDIOGRAM COMPLETE: S' Lateral: 2.4 cm

## 2022-09-24 DIAGNOSIS — E118 Type 2 diabetes mellitus with unspecified complications: Secondary | ICD-10-CM | POA: Diagnosis not present

## 2022-09-24 DIAGNOSIS — Z794 Long term (current) use of insulin: Secondary | ICD-10-CM | POA: Diagnosis not present

## 2022-10-05 ENCOUNTER — Telehealth: Payer: Self-pay | Admitting: Pharmacy Technician

## 2022-10-05 DIAGNOSIS — Z596 Low income: Secondary | ICD-10-CM

## 2022-10-05 NOTE — Progress Notes (Signed)
[facility])                                            [name]    10/05/2022  Dorothy Herring 08-30-1941 [id number]  Care coordination call placed to [employer] in regard to [name] application.  Spoke to [name] who informs patient is APPROVED 09/20/22-09/20/23. Medication will auto fill and ship to patient's home based on last fill date in 2023.  [name], [name]  [phone]

## 2022-10-07 ENCOUNTER — Telehealth: Payer: Self-pay | Admitting: Pharmacy Technician

## 2022-10-07 DIAGNOSIS — Z596 Low income: Secondary | ICD-10-CM

## 2022-10-07 NOTE — Progress Notes (Signed)
[facility])                                            [place] Team    10/07/2022  Dorothy Herring 1941-08-10 [id number]  Care coordination call placed to [place] in regard to Insulin Degludec/Tresiba application.  Spoke to Dorothy Herring who informs patient is APPROVED 09/20/22-09/20/23. Medication will auto fill and ship to provider's based on last time it was filled in 2023.  Dorothy Herring, Dorothy Herring  [phone]

## 2022-10-13 ENCOUNTER — Telehealth: Payer: Self-pay | Admitting: Hematology and Oncology

## 2022-10-13 NOTE — Telephone Encounter (Signed)
Rescheduled appointment per provider PAL. Patient is aware of the changes made to her upcoming appointment. 

## 2022-10-28 ENCOUNTER — Ambulatory Visit: Payer: Medicare HMO | Admitting: Hematology and Oncology

## 2022-10-28 ENCOUNTER — Encounter (HOSPITAL_COMMUNITY): Payer: Self-pay | Admitting: *Deleted

## 2022-11-01 ENCOUNTER — Encounter (INDEPENDENT_AMBULATORY_CARE_PROVIDER_SITE_OTHER): Payer: Medicare HMO | Admitting: Ophthalmology

## 2022-11-01 NOTE — Progress Notes (Unsigned)
HEMATOLOGY-ONCOLOGY TELEPHONE VISIT PROGRESS NOTE  I connected with our patient on 11/02/22 at 11:15 AM EST by telephone and verified that I am speaking with the correct person using two identifiers.  I discussed the limitations, risks, security and privacy concerns of performing an evaluation and management service by telephone and the availability of in person appointments.  I also discussed with the patient that there may be a patient responsible charge related to this service. The patient expressed understanding and agreed to proceed.   History of Present Illness:  Dorothy Herring is a 81 y.o. with above-mentioned history of  left breast DCIS who underwent a lumpectomy, radiation and is currently on antiestrogen therapy with anastrozole. She presents to the clinic for a  telephone follow-up.   Oncology History  Ductal carcinoma in situ (DCIS) of left breast  02/01/2018 Initial Diagnosis   Left nipple discharge, mammogram showing asymmetry, ultrasound reveals 0.5 cm area of concern at 12 o'clock position 1 cm from the nipple, axilla negative, biopsy revealed intermediate grade DCIS ER 100%, PR 100%, Tis N0 stage 0 clinical stage   02/27/2018 Surgery   Left lumpectomy: DCIS with calcifications, intermediate grade, 0.6 cm, margins negative, ER 100%, PR 100%, Tis N0 stage 0    05/02/2018 - 05/24/2018 Radiation Therapy   Adjuvant radiation therapy   Left Breast / 42.56 Gy in 16 fractions   06/2018 -  Anti-estrogen oral therapy   Anastrozole daily     REVIEW OF SYSTEMS:   Constitutional: Denies fevers, chills or abnormal weight loss All other systems were reviewed with the patient and are negative. Observations/Objective:     Assessment Plan:  Ductal carcinoma in situ (DCIS) of left breast 02/27/2018:Left lumpectomy: DCIS with calcifications, intermediate grade, 0.6 cm, margins negative, ER 100%, PR 100%, Tis N0 stage 0   Treatment plan :  1.  Adjuvant radiation therapy   05/02/2018-05/24/18 2. followed by adjuvant antiestrogen therapy with Anastrozole 1 mg daily daily x5 years started June 20, 2018.   Letrozole toxicities: Continues to have hot flashes that are mild to moderate. But she had those even before starting anastrozole. Mild joint stiffness   She completes 5 years of therapy by September 2024.  She will discontinue anastrozole at that time.  Breast cancer surveillance: 1.  Breast exam 11/02/2022: Benign 2.  Mammogram at [facility] 05/12/2022: Benign density cat B   Bone density 08/22/2018: T score -1.2: Mild osteopenia She had another bone density with Dr.Russo this year   Return to clinic on an as-needed basis.    I discussed the assessment and treatment plan with the patient. The patient was provided an opportunity to ask questions and all were answered. The patient agreed with the plan and demonstrated an understanding of the instructions. The patient was advised to call back or seek an in-person evaluation if the symptoms worsen or if the condition fails to improve as anticipated.   I provided 12 minutes of non-face-to-face time during this encounter.  This includes time for charting and coordination of care   Harriette Ohara, MD  I Gardiner Coins am acting as a scribe for Dr.Vinay Gudena  I have reviewed the above documentation for accuracy and completeness, and I agree with the above.

## 2022-11-02 ENCOUNTER — Inpatient Hospital Stay: Payer: Medicare HMO | Attending: Hematology and Oncology | Admitting: Hematology and Oncology

## 2022-11-02 DIAGNOSIS — D0512 Intraductal carcinoma in situ of left breast: Secondary | ICD-10-CM | POA: Diagnosis not present

## 2022-11-02 MED ORDER — ANASTROZOLE 1 MG PO TABS: 1.0000 mg | ORAL_TABLET | Freq: Every day | ORAL | 1 refills | Status: DC

## 2022-11-02 NOTE — Assessment & Plan Note (Signed)
02/27/2018:Left lumpectomy: DCIS with calcifications, intermediate grade, 0.6 cm, margins negative, ER 100%, PR 100%, Tis N0 stage 0   Treatment plan :  1.  Adjuvant radiation therapy  05/02/2018-05/24/18 2. followed by adjuvant antiestrogen therapy with Anastrozole 1 mg daily daily x5 years started June 20, 2018.   Letrozole toxicities: Continues to have hot flashes that are mild to moderate. But she had those even before starting anastrozole. Mild joint stiffness   Breast cancer surveillance: 1.  Breast exam 11/02/2022: Benign 2.  Mammogram at [facility] 05/12/2022: Benign density cat B   Bone density 08/22/2018: T score -1.2: Mild osteopenia She had another bone density with Dr.Russo this year   Return to clinic in 1 year for follow-up

## 2022-11-23 DIAGNOSIS — M25551 Pain in right hip: Secondary | ICD-10-CM | POA: Diagnosis not present

## 2022-11-23 DIAGNOSIS — M778 Other enthesopathies, not elsewhere classified: Secondary | ICD-10-CM | POA: Diagnosis not present

## 2022-11-23 DIAGNOSIS — M545 Low back pain, unspecified: Secondary | ICD-10-CM | POA: Diagnosis not present

## 2022-11-25 DIAGNOSIS — I10 Essential (primary) hypertension: Secondary | ICD-10-CM | POA: Diagnosis not present

## 2022-11-25 DIAGNOSIS — D0512 Intraductal carcinoma in situ of left breast: Secondary | ICD-10-CM | POA: Diagnosis not present

## 2022-11-25 DIAGNOSIS — M8580 Other specified disorders of bone density and structure, unspecified site: Secondary | ICD-10-CM | POA: Diagnosis not present

## 2022-11-25 DIAGNOSIS — Z7901 Long term (current) use of anticoagulants: Secondary | ICD-10-CM | POA: Diagnosis not present

## 2022-11-25 DIAGNOSIS — Z9119 Patient's noncompliance with other medical treatment and regimen due to financial hardship: Secondary | ICD-10-CM | POA: Diagnosis not present

## 2022-11-25 DIAGNOSIS — Z23 Encounter for immunization: Secondary | ICD-10-CM | POA: Diagnosis not present

## 2022-11-25 DIAGNOSIS — E785 Hyperlipidemia, unspecified: Secondary | ICD-10-CM | POA: Diagnosis not present

## 2022-11-25 DIAGNOSIS — D6869 Other thrombophilia: Secondary | ICD-10-CM | POA: Diagnosis not present

## 2022-11-25 DIAGNOSIS — I4892 Unspecified atrial flutter: Secondary | ICD-10-CM | POA: Diagnosis not present

## 2022-11-25 DIAGNOSIS — Z794 Long term (current) use of insulin: Secondary | ICD-10-CM | POA: Diagnosis not present

## 2022-11-25 DIAGNOSIS — E1165 Type 2 diabetes mellitus with hyperglycemia: Secondary | ICD-10-CM | POA: Diagnosis not present

## 2022-11-25 DIAGNOSIS — E1169 Type 2 diabetes mellitus with other specified complication: Secondary | ICD-10-CM | POA: Diagnosis not present

## 2022-11-25 DIAGNOSIS — G629 Polyneuropathy, unspecified: Secondary | ICD-10-CM | POA: Diagnosis not present

## 2022-11-25 DIAGNOSIS — R69 Illness, unspecified: Secondary | ICD-10-CM | POA: Diagnosis not present

## 2022-11-25 DIAGNOSIS — E1140 Type 2 diabetes mellitus with diabetic neuropathy, unspecified: Secondary | ICD-10-CM | POA: Diagnosis not present

## 2022-12-06 DIAGNOSIS — Z85828 Personal history of other malignant neoplasm of skin: Secondary | ICD-10-CM | POA: Diagnosis not present

## 2022-12-06 DIAGNOSIS — L400 Psoriasis vulgaris: Secondary | ICD-10-CM | POA: Diagnosis not present

## 2022-12-06 DIAGNOSIS — L723 Sebaceous cyst: Secondary | ICD-10-CM | POA: Diagnosis not present

## 2022-12-06 DIAGNOSIS — L280 Lichen simplex chronicus: Secondary | ICD-10-CM | POA: Diagnosis not present

## 2022-12-07 DIAGNOSIS — M85852 Other specified disorders of bone density and structure, left thigh: Secondary | ICD-10-CM | POA: Diagnosis not present

## 2022-12-07 DIAGNOSIS — M810 Age-related osteoporosis without current pathological fracture: Secondary | ICD-10-CM | POA: Diagnosis not present

## 2022-12-07 DIAGNOSIS — Z780 Asymptomatic menopausal state: Secondary | ICD-10-CM | POA: Diagnosis not present

## 2022-12-23 DIAGNOSIS — M79641 Pain in right hand: Secondary | ICD-10-CM | POA: Diagnosis not present

## 2022-12-30 ENCOUNTER — Encounter (HOSPITAL_COMMUNITY): Payer: Medicare HMO

## 2023-01-10 ENCOUNTER — Other Ambulatory Visit (HOSPITAL_COMMUNITY): Payer: Self-pay | Admitting: *Deleted

## 2023-01-11 ENCOUNTER — Ambulatory Visit (HOSPITAL_COMMUNITY)
Admission: RE | Admit: 2023-01-11 | Discharge: 2023-01-11 | Disposition: A | Discharge status: Home / Self Care | Payer: Medicare HMO | Source: Ambulatory Visit | Attending: Internal Medicine | Admitting: Internal Medicine

## 2023-01-11 DIAGNOSIS — M810 Age-related osteoporosis without current pathological fracture: Secondary | ICD-10-CM | POA: Diagnosis not present

## 2023-01-11 MED ORDER — DENOSUMAB 60 MG/ML ~~LOC~~ SOSY
60.0000 mg | PREFILLED_SYRINGE | Freq: Once | SUBCUTANEOUS | Status: AC
  Administered 2023-01-11: 60 mg via SUBCUTANEOUS

## 2023-01-11 MED ORDER — DENOSUMAB 60 MG/ML ~~LOC~~ SOSY
PREFILLED_SYRINGE | SUBCUTANEOUS | Status: AC
  Filled 2023-01-11: qty 1

## 2023-01-11 NOTE — Progress Notes (Signed)
Pt stated she had some dental work scheduled for this week.  Prior to giving her her prolia injection she called the dentist, I explained to the dental office what we were giving her and that it was a contraindication to have dental work done with this medication.  The dental office stated she was not having surgery, would not even be numbed, and that it was okay for her to have the prolia today.  I instructed the patient to take her education sheet I gave her with her to her dental appt to ensure they were aware of her getting the prolia today and pt verbalized understanding.

## 2023-01-12 DIAGNOSIS — M25551 Pain in right hip: Secondary | ICD-10-CM | POA: Diagnosis not present

## 2023-01-12 DIAGNOSIS — M47816 Spondylosis without myelopathy or radiculopathy, lumbar region: Secondary | ICD-10-CM | POA: Diagnosis not present

## 2023-01-12 DIAGNOSIS — M25552 Pain in left hip: Secondary | ICD-10-CM | POA: Diagnosis not present

## 2023-01-17 DIAGNOSIS — M48062 Spinal stenosis, lumbar region with neurogenic claudication: Secondary | ICD-10-CM | POA: Diagnosis not present

## 2023-01-20 DIAGNOSIS — M79641 Pain in right hand: Secondary | ICD-10-CM | POA: Diagnosis not present

## 2023-01-22 DIAGNOSIS — D6869 Other thrombophilia: Secondary | ICD-10-CM | POA: Diagnosis not present

## 2023-01-22 DIAGNOSIS — E1142 Type 2 diabetes mellitus with diabetic polyneuropathy: Secondary | ICD-10-CM | POA: Diagnosis not present

## 2023-01-22 DIAGNOSIS — M810 Age-related osteoporosis without current pathological fracture: Secondary | ICD-10-CM | POA: Diagnosis not present

## 2023-01-22 DIAGNOSIS — H409 Unspecified glaucoma: Secondary | ICD-10-CM | POA: Diagnosis not present

## 2023-01-22 DIAGNOSIS — N189 Chronic kidney disease, unspecified: Secondary | ICD-10-CM | POA: Diagnosis not present

## 2023-01-22 DIAGNOSIS — K219 Gastro-esophageal reflux disease without esophagitis: Secondary | ICD-10-CM | POA: Diagnosis not present

## 2023-01-22 DIAGNOSIS — M1990 Unspecified osteoarthritis, unspecified site: Secondary | ICD-10-CM | POA: Diagnosis not present

## 2023-01-22 DIAGNOSIS — M5440 Lumbago with sciatica, unspecified side: Secondary | ICD-10-CM | POA: Diagnosis not present

## 2023-01-22 DIAGNOSIS — R32 Unspecified urinary incontinence: Secondary | ICD-10-CM | POA: Diagnosis not present

## 2023-01-22 DIAGNOSIS — I2510 Atherosclerotic heart disease of native coronary artery without angina pectoris: Secondary | ICD-10-CM | POA: Diagnosis not present

## 2023-01-22 DIAGNOSIS — E785 Hyperlipidemia, unspecified: Secondary | ICD-10-CM | POA: Diagnosis not present

## 2023-01-22 DIAGNOSIS — I4891 Unspecified atrial fibrillation: Secondary | ICD-10-CM | POA: Diagnosis not present

## 2023-01-24 DIAGNOSIS — H52203 Unspecified astigmatism, bilateral: Secondary | ICD-10-CM | POA: Diagnosis not present

## 2023-01-24 DIAGNOSIS — H401131 Primary open-angle glaucoma, bilateral, mild stage: Secondary | ICD-10-CM | POA: Diagnosis not present

## 2023-01-26 DIAGNOSIS — M79641 Pain in right hand: Secondary | ICD-10-CM | POA: Diagnosis not present

## 2023-01-26 DIAGNOSIS — M6530 Trigger finger, unspecified finger: Secondary | ICD-10-CM | POA: Diagnosis not present

## 2023-01-26 DIAGNOSIS — M25641 Stiffness of right hand, not elsewhere classified: Secondary | ICD-10-CM | POA: Diagnosis not present

## 2023-01-26 DIAGNOSIS — E119 Type 2 diabetes mellitus without complications: Secondary | ICD-10-CM | POA: Diagnosis not present

## 2023-01-31 DIAGNOSIS — M79605 Pain in left leg: Secondary | ICD-10-CM | POA: Diagnosis not present

## 2023-01-31 DIAGNOSIS — M79604 Pain in right leg: Secondary | ICD-10-CM | POA: Diagnosis not present

## 2023-01-31 DIAGNOSIS — M25552 Pain in left hip: Secondary | ICD-10-CM | POA: Diagnosis not present

## 2023-01-31 DIAGNOSIS — M25551 Pain in right hip: Secondary | ICD-10-CM | POA: Diagnosis not present

## 2023-02-02 DIAGNOSIS — H401131 Primary open-angle glaucoma, bilateral, mild stage: Secondary | ICD-10-CM | POA: Diagnosis not present

## 2023-02-02 DIAGNOSIS — H26492 Other secondary cataract, left eye: Secondary | ICD-10-CM | POA: Diagnosis not present

## 2023-02-02 DIAGNOSIS — E113493 Type 2 diabetes mellitus with severe nonproliferative diabetic retinopathy without macular edema, bilateral: Secondary | ICD-10-CM | POA: Diagnosis not present

## 2023-02-09 DIAGNOSIS — L400 Psoriasis vulgaris: Secondary | ICD-10-CM | POA: Diagnosis not present

## 2023-02-09 DIAGNOSIS — L821 Other seborrheic keratosis: Secondary | ICD-10-CM | POA: Diagnosis not present

## 2023-02-17 DIAGNOSIS — M79604 Pain in right leg: Secondary | ICD-10-CM | POA: Diagnosis not present

## 2023-02-17 DIAGNOSIS — M25552 Pain in left hip: Secondary | ICD-10-CM | POA: Diagnosis not present

## 2023-02-17 DIAGNOSIS — M25551 Pain in right hip: Secondary | ICD-10-CM | POA: Diagnosis not present

## 2023-02-17 DIAGNOSIS — M79605 Pain in left leg: Secondary | ICD-10-CM | POA: Diagnosis not present

## 2023-02-24 DIAGNOSIS — M79605 Pain in left leg: Secondary | ICD-10-CM | POA: Diagnosis not present

## 2023-02-24 DIAGNOSIS — M25552 Pain in left hip: Secondary | ICD-10-CM | POA: Diagnosis not present

## 2023-02-24 DIAGNOSIS — M79604 Pain in right leg: Secondary | ICD-10-CM | POA: Diagnosis not present

## 2023-02-24 DIAGNOSIS — M25551 Pain in right hip: Secondary | ICD-10-CM | POA: Diagnosis not present

## 2023-02-24 NOTE — Progress Notes (Signed)
  Electrophysiology Office Follow up Visit Note:    Date:  02/25/2023   ID:  Dorothy, Herring 01/08/1941, MRN [id number]  PCP:  Creola Corn, MD  [facility] Cardiologist:  None  [facility] Electrophysiologist:  Lanier Prude, MD    Interval History:    Dorothy Herring is a 82 y.o. female who presents for a follow up visit.   Last seen August 24, 2022 by Dorothy Herring.  The patient has a history of longstanding persistent atrial fibrillation that is asymptomatic.  She takes Xarelto for stroke prophylaxis.  At the appointment with Dorothy Herring in December she reported edema and an echo was ordered.  She has been doing well.  No problems with her Eliquis.  She remains asymptomatic.     Past medical, surgical, social and family history were reviewed.  ROS:   Please see the history of present illness.    All other systems reviewed and are negative.  EKGs/Labs/Other Studies Reviewed:    The following studies were reviewed today:  September 17, 2022 echo EF 65-70 RV normal Trivial MR      Physical Exam:    VS:  BP 116/62   Pulse 74   Ht 5\' 3"  (1.6 m)   Wt 136 lb (61.7 kg)   SpO2 96%   BMI 24.09 kg/m     Wt Readings from Last 3 Encounters:  02/25/23 136 lb (61.7 kg)  08/24/22 135 lb (61.2 kg)  12/30/21 134 lb 6.4 oz (61 kg)     GEN:  Well nourished, well developed in no acute distress CARDIAC: Irregularly irregular, no murmurs, rubs, gallops RESPIRATORY:  Clear to auscultation without rales, wheezing or rhonchi       ASSESSMENT:    1. Persistent atrial fibrillation (HCC)   2. Primary hypertension    PLAN:    In order of problems listed above:  #Longstanding persistent atrial fibrillation On Eliquis for stroke prophylaxis.  Rate controlled.  #Hypertension At goal today.  Recommend checking blood pressures 1-2 times per week at home and recording the values.  Recommend bringing these recordings to the primary care physician.   Follow-up 1 year with  APP.    Signed, Steffanie Dunn, MD, [facility], [facility] 02/25/2023 10:47 AM    Electrophysiology  [facility]

## 2023-02-25 ENCOUNTER — Encounter: Payer: Self-pay | Admitting: Cardiology

## 2023-02-25 ENCOUNTER — Ambulatory Visit: Payer: Medicare HMO | Attending: Cardiology | Admitting: Cardiology

## 2023-02-25 VITALS — BP 116/62 | HR 74 | Ht 63.0 in | Wt 136.0 lb

## 2023-02-25 DIAGNOSIS — I4819 Other persistent atrial fibrillation: Secondary | ICD-10-CM

## 2023-02-25 DIAGNOSIS — I10 Essential (primary) hypertension: Secondary | ICD-10-CM

## 2023-02-25 NOTE — Patient Instructions (Signed)
Medication Instructions:  Your physician recommends that you continue on your current medications as directed. Please refer to the Current Medication list given to you today.  *If you need a refill on your cardiac medications before your next appointment, please call your pharmacy*  Follow-Up: At [facility], you and your health needs are our priority.  As part of our continuing mission to provide you with exceptional heart care, we have created designated Provider Care Teams.  These Care Teams include your primary Cardiologist (physician) and Advanced Practice Providers (APPs -  Physician Assistants and Nurse Practitioners) who all work together to provide you with the care you need, when you need it.  Your next appointment:   1 year(s)  Provider:   You will see one of the following Advanced Practice Providers on your designated Care Team:   Renee Ursuy, PA-C Michael "Andy" Tillery, PA-C Suzann Riddle, NP  

## 2023-03-07 DIAGNOSIS — M25551 Pain in right hip: Secondary | ICD-10-CM | POA: Diagnosis not present

## 2023-03-17 ENCOUNTER — Encounter: Payer: Self-pay | Admitting: Nurse Practitioner

## 2023-03-31 DIAGNOSIS — E1165 Type 2 diabetes mellitus with hyperglycemia: Secondary | ICD-10-CM | POA: Diagnosis not present

## 2023-03-31 DIAGNOSIS — E1169 Type 2 diabetes mellitus with other specified complication: Secondary | ICD-10-CM | POA: Diagnosis not present

## 2023-03-31 DIAGNOSIS — Z794 Long term (current) use of insulin: Secondary | ICD-10-CM | POA: Diagnosis not present

## 2023-03-31 DIAGNOSIS — E11319 Type 2 diabetes mellitus with unspecified diabetic retinopathy without macular edema: Secondary | ICD-10-CM | POA: Diagnosis not present

## 2023-03-31 DIAGNOSIS — Z7901 Long term (current) use of anticoagulants: Secondary | ICD-10-CM | POA: Diagnosis not present

## 2023-03-31 DIAGNOSIS — D0512 Intraductal carcinoma in situ of left breast: Secondary | ICD-10-CM | POA: Diagnosis not present

## 2023-03-31 DIAGNOSIS — I4892 Unspecified atrial flutter: Secondary | ICD-10-CM | POA: Diagnosis not present

## 2023-03-31 DIAGNOSIS — Z9119 Patient's noncompliance with other medical treatment and regimen due to financial hardship: Secondary | ICD-10-CM | POA: Diagnosis not present

## 2023-03-31 DIAGNOSIS — D6869 Other thrombophilia: Secondary | ICD-10-CM | POA: Diagnosis not present

## 2023-03-31 DIAGNOSIS — H409 Unspecified glaucoma: Secondary | ICD-10-CM | POA: Diagnosis not present

## 2023-03-31 DIAGNOSIS — I10 Essential (primary) hypertension: Secondary | ICD-10-CM | POA: Diagnosis not present

## 2023-03-31 DIAGNOSIS — F329 Major depressive disorder, single episode, unspecified: Secondary | ICD-10-CM | POA: Diagnosis not present

## 2023-04-20 ENCOUNTER — Telehealth: Payer: Self-pay | Admitting: Cardiology

## 2023-04-20 NOTE — Telephone Encounter (Signed)
Pt would like a callback to know when she can stop taking her blood thinner. Please advise.

## 2023-04-20 NOTE — Telephone Encounter (Signed)
Left message for patient to call back  

## 2023-04-21 ENCOUNTER — Telehealth: Payer: Self-pay | Admitting: *Deleted

## 2023-04-21 NOTE — Telephone Encounter (Signed)
   Pre-operative Risk Assessment    Patient Name: Dorothy Herring  DOB: 1941-01-19 MRN: [id number]      Request for Surgical Clearance    Procedure:   [facility] LUMBAR INJECTION   Date of Surgery:  Clearance 04/28/23                                 Surgeon:  DR. Barnett Abu Surgeon's Group or Practice Name:  [facility] Phone number:  [phone] Fax number:  [phone]   Type of Clearance Requested:   - Medical  - Pharmacy:  Hold Apixaban (Eliquis) NOT INDICATED   Type of Anesthesia:   SEDATION   Additional requests/questions:    Wilhemina Cash   04/21/2023, 12:51 PM

## 2023-04-22 NOTE — Telephone Encounter (Signed)
Patient with diagnosis of afib on Eliquis for anticoagulation.    Procedure: TRANSLAMINAL LUMBAR INJECTION  Date of procedure: 04/28/23   CHA2DS2-VASc Score = 7   This indicates a 11.2% annual risk of stroke. The patient's score is based upon: CHF History: 0 HTN History: 1 Diabetes History: 1 Stroke History: 2 Vascular Disease History: 0 Age Score: 2 Gender Score: 1      Patient with a remote hx of TIA  eGFR 39  Due to pt hx of TIA, will confirm with Dr. Lalla Brothers that a 3 day hold is ok.  **This guidance is not considered finalized until pre-operative APP has relayed final recommendations.**

## 2023-04-22 NOTE — Telephone Encounter (Signed)
Dr. Lalla Brothers  You saw this patient on 02/25/2023. Will you please comment on medical clearance for translaminal lumbar injection on 04/28/2023?  Please route your response to P CV DIV Preop. I will communicate with requesting office once you have given recommendations.   Thank you!  Carlos Levering, NP

## 2023-04-22 NOTE — Telephone Encounter (Signed)
Please advise holding Eliquis prior to translaminal lumbar injection on 04/28/2023.  Thank you!  DW

## 2023-04-25 NOTE — Telephone Encounter (Signed)
   Name: Dorothy Herring  DOB: 07/26/1941  MRN: [id number]   Primary Cardiologist: None  Chart reviewed as part of pre-operative protocol coverage. LYRIKA ADORNETTO was last seen on 02/25/2023 by Dr. Lalla Brothers.  Per Dr. Lalla Brothers "If she would like to have her back injection, brief interruption in [facility] is OK."  Therefore, based on ACC/AHA guidelines, the patient would be at acceptable risk for the planned procedure without further cardiovascular testing.   Per Pharm D and Dr. Lalla Brothers, patient may hold Eliquis for 3 days prior to procedure.   I will route this recommendation to the requesting party via Epic fax function and remove from pre-op pool. Please call with questions.  Carlos Levering, NP 04/25/2023, 9:11 AM

## 2023-04-25 NOTE — Telephone Encounter (Signed)
Ok to hold Eliquis for 3 days prior to procedure per Dr. Lalla Brothers.

## 2023-04-27 NOTE — Telephone Encounter (Signed)
See note from 04/21/23. Patient needed clearance to hold Eliquis for lumbar injection which has been addressed.

## 2023-04-28 DIAGNOSIS — M5416 Radiculopathy, lumbar region: Secondary | ICD-10-CM | POA: Diagnosis not present

## 2023-04-28 DIAGNOSIS — M5116 Intervertebral disc disorders with radiculopathy, lumbar region: Secondary | ICD-10-CM | POA: Diagnosis not present

## 2023-05-02 DIAGNOSIS — E1140 Type 2 diabetes mellitus with diabetic neuropathy, unspecified: Secondary | ICD-10-CM | POA: Diagnosis not present

## 2023-05-02 DIAGNOSIS — E1165 Type 2 diabetes mellitus with hyperglycemia: Secondary | ICD-10-CM | POA: Diagnosis not present

## 2023-05-02 DIAGNOSIS — E1169 Type 2 diabetes mellitus with other specified complication: Secondary | ICD-10-CM | POA: Diagnosis not present

## 2023-05-02 DIAGNOSIS — E11319 Type 2 diabetes mellitus with unspecified diabetic retinopathy without macular edema: Secondary | ICD-10-CM | POA: Diagnosis not present

## 2023-05-02 DIAGNOSIS — M5416 Radiculopathy, lumbar region: Secondary | ICD-10-CM | POA: Diagnosis not present

## 2023-05-02 DIAGNOSIS — R159 Full incontinence of feces: Secondary | ICD-10-CM | POA: Diagnosis not present

## 2023-05-02 DIAGNOSIS — R197 Diarrhea, unspecified: Secondary | ICD-10-CM | POA: Diagnosis not present

## 2023-05-03 DIAGNOSIS — R197 Diarrhea, unspecified: Secondary | ICD-10-CM | POA: Diagnosis not present

## 2023-05-03 DIAGNOSIS — Z7689 Persons encountering health services in other specified circumstances: Secondary | ICD-10-CM | POA: Diagnosis not present

## 2023-05-10 ENCOUNTER — Telehealth: Payer: Self-pay

## 2023-05-10 NOTE — Telephone Encounter (Signed)
Pt called and LVM stating she "needs a shot" and states she is heaving weakened bones.  Attempted to call pt back. LVM for call back.

## 2023-05-11 DIAGNOSIS — N390 Urinary tract infection, site not specified: Secondary | ICD-10-CM | POA: Diagnosis not present

## 2023-05-12 ENCOUNTER — Telehealth: Payer: Self-pay

## 2023-05-12 DIAGNOSIS — Z7901 Long term (current) use of anticoagulants: Secondary | ICD-10-CM | POA: Diagnosis not present

## 2023-05-12 DIAGNOSIS — N3001 Acute cystitis with hematuria: Secondary | ICD-10-CM | POA: Diagnosis not present

## 2023-05-12 DIAGNOSIS — E1169 Type 2 diabetes mellitus with other specified complication: Secondary | ICD-10-CM | POA: Diagnosis not present

## 2023-05-12 DIAGNOSIS — I4892 Unspecified atrial flutter: Secondary | ICD-10-CM | POA: Diagnosis not present

## 2023-05-12 DIAGNOSIS — R300 Dysuria: Secondary | ICD-10-CM | POA: Diagnosis not present

## 2023-05-12 NOTE — Telephone Encounter (Signed)
Pt LVM regarding scheduling appt. This RN returned called. Per last MD visit note, Dr. Pamelia Hoit states to follow-up to clinic on an as-needed basis and return to clinic if symptoms worsen or do not improve. Pt also informed that the MD note states the ending of 5 year therapy in Sept 2024. Pt informed to call the clinic to schedule appt if any concerns or questions arise and to call once therapy is completed if concerned. Pt verbalized understanding.

## 2023-05-17 ENCOUNTER — Other Ambulatory Visit: Payer: Self-pay | Admitting: Hematology and Oncology

## 2023-05-17 ENCOUNTER — Encounter: Payer: Self-pay | Admitting: Hematology and Oncology

## 2023-05-17 DIAGNOSIS — M810 Age-related osteoporosis without current pathological fracture: Secondary | ICD-10-CM

## 2023-05-18 DIAGNOSIS — Z1231 Encounter for screening mammogram for malignant neoplasm of breast: Secondary | ICD-10-CM | POA: Diagnosis not present

## 2023-05-30 ENCOUNTER — Ambulatory Visit: Payer: Medicare HMO | Admitting: Nurse Practitioner

## 2023-06-12 DIAGNOSIS — S6991XA Unspecified injury of right wrist, hand and finger(s), initial encounter: Secondary | ICD-10-CM | POA: Diagnosis not present

## 2023-06-12 DIAGNOSIS — M79644 Pain in right finger(s): Secondary | ICD-10-CM | POA: Diagnosis not present

## 2023-06-12 DIAGNOSIS — X58XXXA Exposure to other specified factors, initial encounter: Secondary | ICD-10-CM | POA: Diagnosis not present

## 2023-06-22 DIAGNOSIS — S50812A Abrasion of left forearm, initial encounter: Secondary | ICD-10-CM | POA: Diagnosis not present

## 2023-06-22 DIAGNOSIS — R809 Proteinuria, unspecified: Secondary | ICD-10-CM | POA: Diagnosis not present

## 2023-06-22 DIAGNOSIS — N3000 Acute cystitis without hematuria: Secondary | ICD-10-CM | POA: Diagnosis not present

## 2023-06-22 DIAGNOSIS — R822 Biliuria: Secondary | ICD-10-CM | POA: Diagnosis not present

## 2023-07-26 DIAGNOSIS — M79604 Pain in right leg: Secondary | ICD-10-CM | POA: Diagnosis not present

## 2023-07-26 DIAGNOSIS — M25551 Pain in right hip: Secondary | ICD-10-CM | POA: Diagnosis not present

## 2023-07-26 DIAGNOSIS — M25552 Pain in left hip: Secondary | ICD-10-CM | POA: Diagnosis not present

## 2023-07-26 DIAGNOSIS — M79605 Pain in left leg: Secondary | ICD-10-CM | POA: Diagnosis not present

## 2023-07-28 DIAGNOSIS — R946 Abnormal results of thyroid function studies: Secondary | ICD-10-CM | POA: Diagnosis not present

## 2023-07-28 DIAGNOSIS — E1140 Type 2 diabetes mellitus with diabetic neuropathy, unspecified: Secondary | ICD-10-CM | POA: Diagnosis not present

## 2023-07-28 DIAGNOSIS — E785 Hyperlipidemia, unspecified: Secondary | ICD-10-CM | POA: Diagnosis not present

## 2023-07-28 DIAGNOSIS — J019 Acute sinusitis, unspecified: Secondary | ICD-10-CM | POA: Diagnosis not present

## 2023-07-28 DIAGNOSIS — R519 Headache, unspecified: Secondary | ICD-10-CM | POA: Diagnosis not present

## 2023-07-28 DIAGNOSIS — I10 Essential (primary) hypertension: Secondary | ICD-10-CM | POA: Diagnosis not present

## 2023-07-28 DIAGNOSIS — Z1212 Encounter for screening for malignant neoplasm of rectum: Secondary | ICD-10-CM | POA: Diagnosis not present

## 2023-07-28 DIAGNOSIS — E559 Vitamin D deficiency, unspecified: Secondary | ICD-10-CM | POA: Diagnosis not present

## 2023-08-01 DIAGNOSIS — E113493 Type 2 diabetes mellitus with severe nonproliferative diabetic retinopathy without macular edema, bilateral: Secondary | ICD-10-CM | POA: Diagnosis not present

## 2023-08-01 DIAGNOSIS — H26492 Other secondary cataract, left eye: Secondary | ICD-10-CM | POA: Diagnosis not present

## 2023-08-01 DIAGNOSIS — H401131 Primary open-angle glaucoma, bilateral, mild stage: Secondary | ICD-10-CM | POA: Diagnosis not present

## 2023-08-04 DIAGNOSIS — I10 Essential (primary) hypertension: Secondary | ICD-10-CM | POA: Diagnosis not present

## 2023-08-04 DIAGNOSIS — E1165 Type 2 diabetes mellitus with hyperglycemia: Secondary | ICD-10-CM | POA: Diagnosis not present

## 2023-08-04 DIAGNOSIS — I4892 Unspecified atrial flutter: Secondary | ICD-10-CM | POA: Diagnosis not present

## 2023-08-04 DIAGNOSIS — D0512 Intraductal carcinoma in situ of left breast: Secondary | ICD-10-CM | POA: Diagnosis not present

## 2023-08-04 DIAGNOSIS — E785 Hyperlipidemia, unspecified: Secondary | ICD-10-CM | POA: Diagnosis not present

## 2023-08-04 DIAGNOSIS — Z794 Long term (current) use of insulin: Secondary | ICD-10-CM | POA: Diagnosis not present

## 2023-08-04 DIAGNOSIS — D6869 Other thrombophilia: Secondary | ICD-10-CM | POA: Diagnosis not present

## 2023-08-04 DIAGNOSIS — Z1331 Encounter for screening for depression: Secondary | ICD-10-CM | POA: Diagnosis not present

## 2023-08-04 DIAGNOSIS — E1140 Type 2 diabetes mellitus with diabetic neuropathy, unspecified: Secondary | ICD-10-CM | POA: Diagnosis not present

## 2023-08-04 DIAGNOSIS — Z91199 Patient's noncompliance with other medical treatment and regimen due to unspecified reason: Secondary | ICD-10-CM | POA: Diagnosis not present

## 2023-08-04 DIAGNOSIS — Z7901 Long term (current) use of anticoagulants: Secondary | ICD-10-CM | POA: Diagnosis not present

## 2023-08-04 DIAGNOSIS — Z0000 Encounter for general adult medical examination without abnormal findings: Secondary | ICD-10-CM | POA: Diagnosis not present

## 2023-08-10 ENCOUNTER — Ambulatory Visit: Payer: Medicare HMO | Admitting: Nurse Practitioner

## 2023-08-10 DIAGNOSIS — T148XXA Other injury of unspecified body region, initial encounter: Secondary | ICD-10-CM | POA: Diagnosis not present

## 2023-08-10 DIAGNOSIS — G629 Polyneuropathy, unspecified: Secondary | ICD-10-CM | POA: Diagnosis not present

## 2023-08-10 DIAGNOSIS — E1140 Type 2 diabetes mellitus with diabetic neuropathy, unspecified: Secondary | ICD-10-CM | POA: Diagnosis not present

## 2023-08-26 DIAGNOSIS — Z7901 Long term (current) use of anticoagulants: Secondary | ICD-10-CM | POA: Diagnosis not present

## 2023-08-26 DIAGNOSIS — I10 Essential (primary) hypertension: Secondary | ICD-10-CM | POA: Diagnosis not present

## 2023-08-26 DIAGNOSIS — D0512 Intraductal carcinoma in situ of left breast: Secondary | ICD-10-CM | POA: Diagnosis not present

## 2023-08-26 DIAGNOSIS — R051 Acute cough: Secondary | ICD-10-CM | POA: Diagnosis not present

## 2023-08-26 DIAGNOSIS — J029 Acute pharyngitis, unspecified: Secondary | ICD-10-CM | POA: Diagnosis not present

## 2023-08-26 DIAGNOSIS — I4892 Unspecified atrial flutter: Secondary | ICD-10-CM | POA: Diagnosis not present

## 2023-08-26 DIAGNOSIS — J020 Streptococcal pharyngitis: Secondary | ICD-10-CM | POA: Diagnosis not present

## 2023-10-05 DIAGNOSIS — E785 Hyperlipidemia, unspecified: Secondary | ICD-10-CM | POA: Diagnosis not present

## 2023-10-05 DIAGNOSIS — I4891 Unspecified atrial fibrillation: Secondary | ICD-10-CM | POA: Diagnosis not present

## 2023-10-05 DIAGNOSIS — N1831 Chronic kidney disease, stage 3a: Secondary | ICD-10-CM | POA: Diagnosis not present

## 2023-10-05 DIAGNOSIS — Z7901 Long term (current) use of anticoagulants: Secondary | ICD-10-CM | POA: Diagnosis not present

## 2023-10-05 DIAGNOSIS — Z809 Family history of malignant neoplasm, unspecified: Secondary | ICD-10-CM | POA: Diagnosis not present

## 2023-10-05 DIAGNOSIS — Z008 Encounter for other general examination: Secondary | ICD-10-CM | POA: Diagnosis not present

## 2023-10-05 DIAGNOSIS — E1122 Type 2 diabetes mellitus with diabetic chronic kidney disease: Secondary | ICD-10-CM | POA: Diagnosis not present

## 2023-10-05 DIAGNOSIS — I129 Hypertensive chronic kidney disease with stage 1 through stage 4 chronic kidney disease, or unspecified chronic kidney disease: Secondary | ICD-10-CM | POA: Diagnosis not present

## 2023-10-05 DIAGNOSIS — D6869 Other thrombophilia: Secondary | ICD-10-CM | POA: Diagnosis not present

## 2023-10-05 DIAGNOSIS — Z794 Long term (current) use of insulin: Secondary | ICD-10-CM | POA: Diagnosis not present

## 2023-10-05 DIAGNOSIS — I2510 Atherosclerotic heart disease of native coronary artery without angina pectoris: Secondary | ICD-10-CM | POA: Diagnosis not present

## 2023-10-05 DIAGNOSIS — E1142 Type 2 diabetes mellitus with diabetic polyneuropathy: Secondary | ICD-10-CM | POA: Diagnosis not present

## 2023-10-10 ENCOUNTER — Other Ambulatory Visit: Payer: Self-pay

## 2023-10-10 ENCOUNTER — Encounter (HOSPITAL_BASED_OUTPATIENT_CLINIC_OR_DEPARTMENT_OTHER): Payer: Self-pay | Admitting: Emergency Medicine

## 2023-10-10 ENCOUNTER — Emergency Department (HOSPITAL_BASED_OUTPATIENT_CLINIC_OR_DEPARTMENT_OTHER)
Admission: EM | Admit: 2023-10-10 | Discharge: 2023-10-10 | Disposition: A | Discharge status: Home / Self Care | Payer: Medicare HMO | Attending: Emergency Medicine | Admitting: Emergency Medicine

## 2023-10-10 DIAGNOSIS — I4891 Unspecified atrial fibrillation: Secondary | ICD-10-CM | POA: Insufficient documentation

## 2023-10-10 DIAGNOSIS — Z7984 Long term (current) use of oral hypoglycemic drugs: Secondary | ICD-10-CM | POA: Diagnosis not present

## 2023-10-10 DIAGNOSIS — M25519 Pain in unspecified shoulder: Secondary | ICD-10-CM | POA: Insufficient documentation

## 2023-10-10 DIAGNOSIS — Z7901 Long term (current) use of anticoagulants: Secondary | ICD-10-CM | POA: Insufficient documentation

## 2023-10-10 DIAGNOSIS — J029 Acute pharyngitis, unspecified: Secondary | ICD-10-CM | POA: Insufficient documentation

## 2023-10-10 DIAGNOSIS — Z794 Long term (current) use of insulin: Secondary | ICD-10-CM | POA: Insufficient documentation

## 2023-10-10 DIAGNOSIS — G8929 Other chronic pain: Secondary | ICD-10-CM | POA: Diagnosis not present

## 2023-10-10 DIAGNOSIS — M25511 Pain in right shoulder: Secondary | ICD-10-CM | POA: Diagnosis not present

## 2023-10-10 DIAGNOSIS — Z20822 Contact with and (suspected) exposure to covid-19: Secondary | ICD-10-CM | POA: Insufficient documentation

## 2023-10-10 LAB — RESP PANEL BY RT-PCR (RSV, FLU A&B, COVID)  RVPGX2
Influenza A by PCR: NEGATIVE
Influenza B by PCR: NEGATIVE
Resp Syncytial Virus by PCR: NEGATIVE
SARS Coronavirus 2 by RT PCR: NEGATIVE

## 2023-10-10 LAB — GROUP A STREP BY PCR: Group A Strep by PCR: NOT DETECTED

## 2023-10-10 NOTE — ED Triage Notes (Signed)
States sore throat x 3 days. Also endorses R shoulder pain from fall LAST YEAR that has not gotten better.

## 2023-10-10 NOTE — Discharge Instructions (Addendum)
You are seen in the ER for sore throat that has been persistent now for several days. As discussed, your COVID, RSV, flu and strep test are all negative.  Please read the instructions provided on sore throat. It is not entirely clear why you have sore throat, but it is not normal for sore throat persistent for several days and we recommend that you follow-up with the primary care doctor for further assessment.  If unable to get in with your primary care doctor in a timely fashion, then consider calling ENT doctors at the number provided.  For your shoulder pain, we recommend that you follow-up with the same orthopedist that has seen you and ordered physical therapy.

## 2023-10-10 NOTE — ED Provider Notes (Signed)
[place] EMERGENCY DEPARTMENT AT [facility] Provider Note   CSN: [id number] Arrival date & time: 10/10/23  1650     History  Chief Complaint  Patient presents with   Sore Throat    Dorothy Herring is a 83 y.o. female.  HPI     83 year old patient comes in with chief complaint of sore throat.  Patient states that she has had a sore throat for at least a month.  She saw her PCP Dr. Timothy Lasso, was started on Augmentin.  Initially she got better, but the symptoms returned.  With the sore throat, there is some change in voice and pain with swallowing, but she denies any difficulty in swallowing.  Patient has not contacted PCP about her persistent sore throat.  Review of system is negative for any night sweats, weight loss.  Patient tells me that she has no personal history of cancer.  Additionally, patient states that in December 2023 she had a fall and she continues to have shoulder pain.  She had followed up with orthopedic surgeons and also had physical therapy, but her symptoms have not completely resolved and she continues to have pain for which she is to take Tylenol.  Past medical history on chart review indicates that patient has history of A-fib and also previous history of DCIS of the left breast.   Home Medications Prior to Admission medications   Medication Sig Start Date End Date Taking? Authorizing Provider  anastrozole (ARIMIDEX) 1 MG tablet Take 1 tablet (1 mg total) by mouth daily. 11/02/22   Serena Croissant, MD  apixaban (ELIQUIS) 5 MG TABS tablet Take 1 tablet (5 mg total) by mouth 2 (two) times daily. 08/11/22   Allred, Fayrene Fearing, MD  empagliflozin (JARDIANCE) 10 MG TABS tablet Take 10 mg by mouth daily.    [provider]  gabapentin (NEURONTIN) 300 MG capsule Take 300 mg by mouth daily.     [provider]  insulin glargine (LANTUS) 100 UNIT/ML injection Inject 24 Units into the skin at bedtime.    [provider]  Lancets (ONETOUCH  DELICA PLUS LANCET30G) MISC USE TO CHECK BLOOD SUGAR TWICE DAILY AS DIRECTED 12/24/21   [provider]  LUMIGAN 0.01 % SOLN Place 1 drop into both eyes at bedtime.  03/09/18   [provider]  [facility] VERIO test strip 2 (two) times daily. 12/24/21   [provider]  rosuvastatin (CRESTOR) 40 MG tablet Take 40 mg by mouth daily. 12/31/17   [provider]      Allergies    Patient has no known allergies.    Review of Systems   Review of Systems  All other systems reviewed and are negative.   Physical Exam Updated Vital Signs BP 134/60   Pulse 79   Temp 97.9 F (36.6 C)   Resp 16   Ht 5\' 4"  (1.626 m)   Wt 60.8 kg   SpO2 100%   BMI 23.00 kg/m  Physical Exam Vitals and nursing note reviewed.  Constitutional:      Appearance: She is well-developed.  HENT:     Head: Atraumatic.     Mouth/Throat:     Mouth: Mucous membranes are moist. No oral lesions.     Pharynx: Uvula midline. Posterior oropharyngeal erythema present. No pharyngeal swelling or oropharyngeal exudate.     Tonsils: No tonsillar exudate or tonsillar abscesses.     Comments: Just lateral to the left tonsillar pillar there is erythema, but no ulceration Cardiovascular:  Rate and Rhythm: Normal rate.  Pulmonary:     Effort: Pulmonary effort is normal.  Musculoskeletal:     Cervical back: Normal range of motion and neck supple.  Lymphadenopathy:     Cervical: Cervical adenopathy present.  Skin:    General: Skin is warm and dry.  Neurological:     Mental Status: She is alert and oriented to person, place, and time.     ED Results / Procedures / Treatments   Labs (all labs ordered are listed, but only abnormal results are displayed) Labs Reviewed  RESP PANEL BY RT-PCR (RSV, FLU A&B, COVID)  [id number]  GROUP A STREP BY PCR    EKG None  Radiology No results found.  Procedures Procedures    Medications Ordered in ED Medications - No data to display  ED Course/  Medical Decision Making/ A&P                                 Medical Decision Making  83 year old female comes in with chief complaint of persistent sore throat. She informs me that her sore throat has been present for several days, in fact she started with pharyngitis a month ago, took antibiotics, there was improvement for 2 days and then her symptoms have been present since then.  In triage she had mentioned sore throat for 3 days.  She also mentions what sounds like chronic pain to the right shoulder for which she has previously seek orthopedic care.   Patient's exam is overall reassuring. She has some erythema over the left hard palate, just lateral to the tonsillar pillar.  No exudates, no tonsillar enlargement and no thrush noted.  Patient did have diffuse lymphadenopathy, but there is no tenderness with palpation the lymph node in the throat.  Patient states that she has never smoked.  She has no red flags for cancer such as night sweats or weight loss, and informs me that she has no history of cancer, but her records indicate that she has history of DCIS of the left breast.  I reviewed patient's record.  In 2018 she had CT scan of her neck which was reassuring.   Differential diagnosis considered for this patient includes infectious etiology such as pharyngitis, dryness causing throat irritation, tumor/malignancy, chronic GERD.  Exam is overall reassuring.  Rapid strep and COVID test was ordered from triage which is reassuring.  Given the overcrowding of the ER, patient was in the waiting room for over 3 hours.  I was able to pull her back as her test were negative and she had simple complaints.  Patient was frustrated with the wait time already and questioning why in 3 hours she has no answers.  I was able to explain to the patient the scope of workup in the emergency room, based on my assessment she will likely need more thorough evaluation for this sore throat, but there is a high  likelihood that we will not be able to find the answer in the ER given the chronicity of her symptoms.  Patient will need to see her PCP ideally and they can consider advanced imaging or sending her to ENT if needed.  Given patient's frustration, I also gave her ENT doctors phone number directly in case she is not able to get in with Dr. Timothy Lasso in timely fashion.  When I looked at patient's record, noted that she has history of DCIS, I did discuss with  her that we can get a CT soft tissue neck with contrast, but it will require more time as we will have to get labs, followed by CT scan, followed by waiting for results.  At the minimum we will need 2 more hours for assessment.  And the results of that could be negative.  Patient expressed to me that she will just call Dr. Timothy Lasso for a prompt follow-up appointment tomorrow.  The patient appears reasonably screened and/or stabilized for discharge and I doubt any other medical condition or other [facility] requiring further screening, evaluation, or treatment in the ED at this time prior to discharge.   Results from the ER workup discussed with the patient face to face and all questions answered to the best of my ability. The patient is safe for discharge with strict return precautions.   Final Clinical Impression(s) / ED Diagnoses Final diagnoses:  Pharyngitis, unspecified etiology  Chronic shoulder pain, unspecified laterality    Rx / DC Orders ED Discharge Orders     None         Derwood Kaplan, MD 10/10/23 2038

## 2023-10-11 ENCOUNTER — Ambulatory Visit (HOSPITAL_COMMUNITY)
Admission: RE | Admit: 2023-10-11 | Discharge: 2023-10-11 | Disposition: A | Discharge status: Home / Self Care | Payer: Medicare HMO | Source: Ambulatory Visit | Attending: Registered Nurse | Admitting: Registered Nurse

## 2023-10-11 ENCOUNTER — Other Ambulatory Visit (HOSPITAL_COMMUNITY): Payer: Self-pay | Admitting: Registered Nurse

## 2023-10-11 DIAGNOSIS — J029 Acute pharyngitis, unspecified: Secondary | ICD-10-CM | POA: Diagnosis not present

## 2023-10-11 DIAGNOSIS — R490 Dysphonia: Secondary | ICD-10-CM | POA: Diagnosis not present

## 2023-10-11 DIAGNOSIS — K111 Hypertrophy of salivary gland: Secondary | ICD-10-CM | POA: Diagnosis not present

## 2023-10-11 DIAGNOSIS — Z7901 Long term (current) use of anticoagulants: Secondary | ICD-10-CM | POA: Diagnosis not present

## 2023-10-11 DIAGNOSIS — R590 Localized enlarged lymph nodes: Secondary | ICD-10-CM | POA: Diagnosis not present

## 2023-10-11 DIAGNOSIS — I10 Essential (primary) hypertension: Secondary | ICD-10-CM | POA: Diagnosis not present

## 2023-10-11 DIAGNOSIS — M5030 Other cervical disc degeneration, unspecified cervical region: Secondary | ICD-10-CM | POA: Diagnosis not present

## 2023-10-11 DIAGNOSIS — I4892 Unspecified atrial flutter: Secondary | ICD-10-CM | POA: Diagnosis not present

## 2023-10-11 DIAGNOSIS — Z853 Personal history of malignant neoplasm of breast: Secondary | ICD-10-CM | POA: Diagnosis not present

## 2023-10-11 LAB — POCT I-STAT CREATININE: Creatinine, Ser: 0.9 mg/dL (ref 0.44–1.00)

## 2023-10-11 MED ORDER — IOHEXOL 300 MG/ML  SOLN
100.0000 mL | Freq: Once | INTRAMUSCULAR | Status: AC | PRN
  Administered 2023-10-11: 100 mL via INTRAVENOUS

## 2023-10-15 DIAGNOSIS — M25511 Pain in right shoulder: Secondary | ICD-10-CM | POA: Diagnosis not present

## 2023-10-15 DIAGNOSIS — M545 Low back pain, unspecified: Secondary | ICD-10-CM | POA: Diagnosis not present

## 2023-10-27 DIAGNOSIS — J069 Acute upper respiratory infection, unspecified: Secondary | ICD-10-CM | POA: Diagnosis not present

## 2023-10-27 DIAGNOSIS — R058 Other specified cough: Secondary | ICD-10-CM | POA: Diagnosis not present

## 2023-10-31 DIAGNOSIS — Z7901 Long term (current) use of anticoagulants: Secondary | ICD-10-CM | POA: Diagnosis not present

## 2023-10-31 DIAGNOSIS — Z1152 Encounter for screening for COVID-19: Secondary | ICD-10-CM | POA: Diagnosis not present

## 2023-10-31 DIAGNOSIS — R1110 Vomiting, unspecified: Secondary | ICD-10-CM | POA: Diagnosis not present

## 2023-10-31 DIAGNOSIS — I4892 Unspecified atrial flutter: Secondary | ICD-10-CM | POA: Diagnosis not present

## 2023-10-31 DIAGNOSIS — I10 Essential (primary) hypertension: Secondary | ICD-10-CM | POA: Diagnosis not present

## 2023-10-31 DIAGNOSIS — D0512 Intraductal carcinoma in situ of left breast: Secondary | ICD-10-CM | POA: Diagnosis not present

## 2023-10-31 DIAGNOSIS — R058 Other specified cough: Secondary | ICD-10-CM | POA: Diagnosis not present

## 2023-10-31 DIAGNOSIS — R197 Diarrhea, unspecified: Secondary | ICD-10-CM | POA: Diagnosis not present

## 2023-10-31 DIAGNOSIS — J069 Acute upper respiratory infection, unspecified: Secondary | ICD-10-CM | POA: Diagnosis not present

## 2023-10-31 DIAGNOSIS — R0981 Nasal congestion: Secondary | ICD-10-CM | POA: Diagnosis not present

## 2023-11-17 DIAGNOSIS — N898 Other specified noninflammatory disorders of vagina: Secondary | ICD-10-CM | POA: Diagnosis not present

## 2023-11-17 DIAGNOSIS — R300 Dysuria: Secondary | ICD-10-CM | POA: Diagnosis not present

## 2023-12-06 DIAGNOSIS — M542 Cervicalgia: Secondary | ICD-10-CM | POA: Diagnosis not present

## 2023-12-08 DIAGNOSIS — D0512 Intraductal carcinoma in situ of left breast: Secondary | ICD-10-CM | POA: Diagnosis not present

## 2023-12-08 DIAGNOSIS — D692 Other nonthrombocytopenic purpura: Secondary | ICD-10-CM | POA: Diagnosis not present

## 2023-12-08 DIAGNOSIS — I4892 Unspecified atrial flutter: Secondary | ICD-10-CM | POA: Diagnosis not present

## 2023-12-08 DIAGNOSIS — Z7901 Long term (current) use of anticoagulants: Secondary | ICD-10-CM | POA: Diagnosis not present

## 2023-12-15 DIAGNOSIS — Z91199 Patient's noncompliance with other medical treatment and regimen due to unspecified reason: Secondary | ICD-10-CM | POA: Diagnosis not present

## 2023-12-15 DIAGNOSIS — E1140 Type 2 diabetes mellitus with diabetic neuropathy, unspecified: Secondary | ICD-10-CM | POA: Diagnosis not present

## 2023-12-15 DIAGNOSIS — D0512 Intraductal carcinoma in situ of left breast: Secondary | ICD-10-CM | POA: Diagnosis not present

## 2023-12-15 DIAGNOSIS — E11319 Type 2 diabetes mellitus with unspecified diabetic retinopathy without macular edema: Secondary | ICD-10-CM | POA: Diagnosis not present

## 2023-12-15 DIAGNOSIS — I10 Essential (primary) hypertension: Secondary | ICD-10-CM | POA: Diagnosis not present

## 2023-12-15 DIAGNOSIS — Z9119 Patient's noncompliance with other medical treatment and regimen due to financial hardship: Secondary | ICD-10-CM | POA: Diagnosis not present

## 2023-12-15 DIAGNOSIS — G629 Polyneuropathy, unspecified: Secondary | ICD-10-CM | POA: Diagnosis not present

## 2023-12-15 DIAGNOSIS — H409 Unspecified glaucoma: Secondary | ICD-10-CM | POA: Diagnosis not present

## 2023-12-15 DIAGNOSIS — M6530 Trigger finger, unspecified finger: Secondary | ICD-10-CM | POA: Diagnosis not present

## 2023-12-15 DIAGNOSIS — Z794 Long term (current) use of insulin: Secondary | ICD-10-CM | POA: Diagnosis not present

## 2023-12-15 DIAGNOSIS — M810 Age-related osteoporosis without current pathological fracture: Secondary | ICD-10-CM | POA: Diagnosis not present

## 2023-12-15 DIAGNOSIS — I4892 Unspecified atrial flutter: Secondary | ICD-10-CM | POA: Diagnosis not present

## 2023-12-15 DIAGNOSIS — D6869 Other thrombophilia: Secondary | ICD-10-CM | POA: Diagnosis not present

## 2023-12-21 DIAGNOSIS — M25552 Pain in left hip: Secondary | ICD-10-CM | POA: Diagnosis not present

## 2023-12-21 DIAGNOSIS — M542 Cervicalgia: Secondary | ICD-10-CM | POA: Diagnosis not present

## 2023-12-21 DIAGNOSIS — M25511 Pain in right shoulder: Secondary | ICD-10-CM | POA: Diagnosis not present

## 2023-12-21 DIAGNOSIS — M25551 Pain in right hip: Secondary | ICD-10-CM | POA: Diagnosis not present

## 2023-12-27 DIAGNOSIS — M542 Cervicalgia: Secondary | ICD-10-CM | POA: Diagnosis not present

## 2023-12-27 DIAGNOSIS — M25552 Pain in left hip: Secondary | ICD-10-CM | POA: Diagnosis not present

## 2023-12-27 DIAGNOSIS — M25511 Pain in right shoulder: Secondary | ICD-10-CM | POA: Diagnosis not present

## 2023-12-27 DIAGNOSIS — M25551 Pain in right hip: Secondary | ICD-10-CM | POA: Diagnosis not present

## 2024-01-09 DIAGNOSIS — M542 Cervicalgia: Secondary | ICD-10-CM | POA: Diagnosis not present

## 2024-01-09 DIAGNOSIS — M25552 Pain in left hip: Secondary | ICD-10-CM | POA: Diagnosis not present

## 2024-01-09 DIAGNOSIS — M25511 Pain in right shoulder: Secondary | ICD-10-CM | POA: Diagnosis not present

## 2024-01-09 DIAGNOSIS — M25551 Pain in right hip: Secondary | ICD-10-CM | POA: Diagnosis not present

## 2024-01-17 DIAGNOSIS — M542 Cervicalgia: Secondary | ICD-10-CM | POA: Diagnosis not present

## 2024-01-17 DIAGNOSIS — M25552 Pain in left hip: Secondary | ICD-10-CM | POA: Diagnosis not present

## 2024-01-17 DIAGNOSIS — M25551 Pain in right hip: Secondary | ICD-10-CM | POA: Diagnosis not present

## 2024-01-17 DIAGNOSIS — M25511 Pain in right shoulder: Secondary | ICD-10-CM | POA: Diagnosis not present

## 2024-01-24 DIAGNOSIS — M25552 Pain in left hip: Secondary | ICD-10-CM | POA: Diagnosis not present

## 2024-01-24 DIAGNOSIS — M542 Cervicalgia: Secondary | ICD-10-CM | POA: Diagnosis not present

## 2024-01-24 DIAGNOSIS — M25551 Pain in right hip: Secondary | ICD-10-CM | POA: Diagnosis not present

## 2024-01-24 DIAGNOSIS — M25511 Pain in right shoulder: Secondary | ICD-10-CM | POA: Diagnosis not present

## 2024-02-08 DIAGNOSIS — M4316 Spondylolisthesis, lumbar region: Secondary | ICD-10-CM | POA: Diagnosis not present

## 2024-02-16 ENCOUNTER — Telehealth (HOSPITAL_BASED_OUTPATIENT_CLINIC_OR_DEPARTMENT_OTHER): Payer: Self-pay | Admitting: *Deleted

## 2024-02-16 NOTE — Telephone Encounter (Signed)
 Call 1x - [email or web address] to schedule patient w/ EP APP for 1 year follow up + pre-op clearance for 7/1 procedure (see 5/29 phone note)... spot held for patient on 6/11 at 850am w/ Alena An, NP.

## 2024-02-16 NOTE — Telephone Encounter (Signed)
   Pre-operative Risk Assessment    Patient Name: Dorothy Herring  DOB: 1940-12-19 MRN: [id number]   Date of last office visit: 02/25/2023 Date of next office visit: None  Request for Surgical Clearance    Procedure:  Translam injection  Date of Surgery:  Clearance 03/20/24                                 Surgeon:  Dr. Elna Haggis Surgeon's Group or Practice Name:  [facility] Phone number:  [phone] Fax number:  [phone]   Type of Clearance Requested:   - Medical  - Pharmacy:  Hold Apixaban  (Eliquis ) Not Indicated   Type of Anesthesia:  Moderate sedation   Additional requests/questions:    Signed, Lauris Port   02/16/2024, 9:48 AM

## 2024-02-16 NOTE — Telephone Encounter (Signed)
   Name: Dorothy Herring  DOB: October 26, 1940  MRN: [id number]  Primary Cardiologist: Dr. Marven Slimmer  Chart reviewed as part of pre-operative protocol coverage. Because of [name]'s past medical history and time since last visit, she will require a follow-up in-office visit in order to better assess preoperative cardiovascular risk.  Patient is due for annual follow-up with EP.  Pre-op covering staff: - Please schedule appointment and call patient to inform them. If patient already had an upcoming appointment within acceptable timeframe, please add "pre-op clearance" to the appointment notes so provider is aware. - Please contact requesting surgeon's office via preferred method (i.e, phone, fax) to inform them of need for appointment prior to surgery.  This message will also be routed to pharmacy pool for input on holding Eliquis  as requested below so that this information is available to the clearing provider at time of patient's appointment.   Jude Norton, NP  02/16/2024, 10:01 AM

## 2024-02-20 DIAGNOSIS — E113293 Type 2 diabetes mellitus with mild nonproliferative diabetic retinopathy without macular edema, bilateral: Secondary | ICD-10-CM | POA: Diagnosis not present

## 2024-02-20 DIAGNOSIS — Z961 Presence of intraocular lens: Secondary | ICD-10-CM | POA: Diagnosis not present

## 2024-02-20 DIAGNOSIS — H401131 Primary open-angle glaucoma, bilateral, mild stage: Secondary | ICD-10-CM | POA: Diagnosis not present

## 2024-02-20 NOTE — Telephone Encounter (Signed)
 Patient with diagnosis of atrial fibrillation on Eliquis  for anticoagulation.    Procedure:  Translam injection   Date of Surgery:  Clearance 03/20/24     CHA2DS2-VASc Score = 7   This indicates a 11.2% annual risk of stroke. The patient's score is based upon: CHF History: 0 HTN History: 1 Diabetes History: 1 Stroke History: 2 Vascular Disease History: 0 Age Score: 2 Gender Score: 1   Per chart has history of remote TIA, has held anticoagulation for 3 days previously  CrCl 49 Platelet count 168  Patient has not had an Afib/aflutter ablation within the last 3 months or DCCV within the last 30 days  Per office protocol, patient can hold Eliquis  for 3 days prior to procedure.   Patient will not need bridging with Lovenox (enoxaparin) around procedure.  **This guidance is not considered finalized until pre-operative APP has relayed final recommendations.**

## 2024-02-27 ENCOUNTER — Encounter: Payer: Self-pay | Admitting: Hematology and Oncology

## 2024-02-29 ENCOUNTER — Ambulatory Visit: Payer: Self-pay | Attending: Pulmonary Disease | Admitting: Pulmonary Disease

## 2024-02-29 ENCOUNTER — Encounter: Payer: Self-pay | Admitting: Hematology and Oncology

## 2024-02-29 ENCOUNTER — Encounter: Payer: Self-pay | Admitting: Pulmonary Disease

## 2024-02-29 VITALS — BP 122/74 | HR 68 | Ht 64.0 in | Wt 133.8 lb

## 2024-02-29 DIAGNOSIS — I4819 Other persistent atrial fibrillation: Secondary | ICD-10-CM | POA: Diagnosis not present

## 2024-02-29 DIAGNOSIS — D6869 Other thrombophilia: Secondary | ICD-10-CM

## 2024-02-29 DIAGNOSIS — Z01810 Encounter for preprocedural cardiovascular examination: Secondary | ICD-10-CM

## 2024-02-29 NOTE — Progress Notes (Signed)
 Electrophysiology Office Note:   Date:  02/29/2024  ID:  Glady, [place] 1941/09/17, MRN [id number]  Primary Cardiologist: None Primary Heart Failure: None Electrophysiologist: Boyce Byes, MD      History of Present Illness:   Dorothy Herring is a 83 y.o. female with h/o AF, DM II, breast cancer seen today for routine electrophysiology follow up & pre-operative clearance.    Since last being seen in our clinic the patient reports she is pending an injection in her back.  She had one last year and it is starting to wear off.  She is hopeful to avoid surgery.  She reports her PCP changed her from Eliquis  to Xarelto due to insurance coverage.  No bleeding issues on Xarelto. She is unaware of her AF.     She denies chest pain, palpitations, dyspnea, PND, orthopnea, nausea, vomiting, dizziness, syncope, edema, weight gain, or early satiety.   Review of systems complete and found to be negative unless listed in HPI.   EP Information / Studies Reviewed:    EKG is ordered today. Personal review as below.  EKG Interpretation Date/Time:  Wednesday February 29 2024 08:46:30 EDT Ventricular Rate:  68 PR Interval:    QRS Duration:  80 QT Interval:  384 QTC Calculation: 408 R Axis:   30  Text Interpretation: Atrial fibrillation Low voltage QRS Confirmed by Creighton Doffing ([id number]) on 02/29/2024 8:59:50 AM   Studies:  ECHO 08/2022 >  LVEF 65-70%, no RWMA, RA/LA normal size  Arrhythmia / AAD AF  DCCV 04/30/2018 > went back into AF fairly quickly after   Risk Assessment/Calculations:    CHA2DS2-VASc Score = 7   This indicates a 11.2% annual risk of stroke. The patient's score is based upon: CHF History: 0 HTN History: 1 Diabetes History: 1 Stroke History: 2 Vascular Disease History: 0 Age Score: 2 Gender Score: 1             Physical Exam:   VS:  BP 122/74   Pulse 68   Ht 5' 4 (1.626 m)   Wt 133 lb 12.8 oz (60.7 kg)   SpO2 99%   BMI 22.97 kg/m    Wt Readings from  Last 3 Encounters:  02/29/24 133 lb 12.8 oz (60.7 kg)  10/10/23 134 lb (60.8 kg)  02/25/23 136 lb (61.7 kg)     GEN: Well nourished, well developed in no acute distress NECK: No JVD; No carotid bruits CARDIAC: Irregularly irregular rate and rhythm, no murmurs, rubs, gallops RESPIRATORY:  Clear to auscultation without rales, wheezing or rhonchi  ABDOMEN: Soft, non-tender, non-distended EXTREMITIES:  No edema; No deformity   ASSESSMENT AND PLAN:    Longstanding Persistent Atrial Fibrillation  CHA2DS2-VASc 7 -EKG with AF, controlled ventricular rate -OAC for stroke prophylaxis  -only one attempt at DCCV in 2019, discussed consideration for possible AAD loading with DCCV and consideration for ablation. Pt elects to defer for now.   Secondary Hypercoagulable State  -continue Xarelto, dose reviewed and appropriate  -recent BMP 07/2023 with PCP, Cr Cl 41 mL/min  Pre-Operative Surgical Clearance  Procedure:  Translam injection Date of Surgery:  Clearance 03/20/24                              Surgeon:  Dr. Elna Haggis Surgeon's Group or Practice Name:  [facility] Phone number:  [phone] Fax number:  [phone] Type of Clearance Requested:   - Medical  -  Pharmacy:  Hold Apixaban  (Eliquis ) Not Indicated > can hold Eliquis  3 days prior to procedure Type of Anesthesia:  Moderate sedation      Ms. Olgin's perioperative risk of a major cardiac event is 0.9% according to the Revised Cardiac Risk Index (RCRI).  Therefore, she is at low risk for perioperative complications.     Recommendations: According to ACC/AHA guidelines, no further cardiovascular testing needed.  The patient may proceed to surgery at acceptable risk.    Antiplatelet and/or Anticoagulation Recommendations:  Xarelto (Rivaroxaban) can be held for 3 days prior to surgery.  Please resume post op when felt to be safe.      Follow up with Dr. Marven Slimmer in 12 months  Signed, Creighton Doffing, NP-C,  AGACNP-BC [facility] - Electrophysiology  02/29/2024, 9:00 AM

## 2024-02-29 NOTE — Patient Instructions (Signed)
 Medication Instructions:  No medication changes today. *If you need a refill on your cardiac medications before your next appointment, please call your pharmacy*  Lab Work: No labwork ordered today. If you have labs (blood work) drawn today and your tests are completely normal, you will receive your results only by: MyChart Message (if you have MyChart) OR A paper copy in the mail If you have any lab test that is abnormal or we need to change your treatment, we will call you to review the results.  Testing/Procedures: No testing ordered today  Follow-Up: At [facility], you and your health needs are our priority.  As part of our continuing mission to provide you with exceptional heart care, our providers are all part of one team.  This team includes your primary Cardiologist (physician) and Advanced Practice Providers or APPs (Physician Assistants and Nurse Practitioners) who all work together to provide you with the care you need, when you need it.  Your next appointment:   1 year(s)  Provider:   You may see Boyce Byes, MD or one of the following Advanced Practice Providers on your designated Care Team:   Mertha Abrahams, [place] Merla Starch, PA-C Suzann Riddle, NP Creighton Doffing, NP

## 2024-03-14 DIAGNOSIS — S2231XA Fracture of one rib, right side, initial encounter for closed fracture: Secondary | ICD-10-CM | POA: Diagnosis not present

## 2024-03-14 DIAGNOSIS — S46911A Strain of unspecified muscle, fascia and tendon at shoulder and upper arm level, right arm, initial encounter: Secondary | ICD-10-CM | POA: Diagnosis not present

## 2024-03-15 NOTE — Telephone Encounter (Signed)
 Will route to Preop APP for review: Pt asking about the following:  Pt called in asking when should she restart Xarelto. She states the requesting told her to ask here.

## 2024-03-15 NOTE — Telephone Encounter (Signed)
 Pt called in asking when should she restart Xarelto. She states the requesting told her to ask here.

## 2024-03-16 NOTE — Telephone Encounter (Signed)
   Patient Name: Dorothy Herring  DOB: 01-30-1941 MRN: [id number]  Primary Cardiologist: Cindie  Chart reviewed as part of pre-operative protocol coverage. Given past medical history and time since last visit, based on ACC/AHA guidelines, [place] DEPACE is at acceptable risk for the planned procedure without further cardiovascular testing.   Ms. Knack perioperative risk of a major cardiac event is 0.9% according to the Revised Cardiac Risk Index (RCRI).  Therefore, she is at low risk for perioperative complications.   She is to restart Xarelto ASAP at the discretion of the surgeon after the procedure.  The patient was advised that if she develops new symptoms prior to surgery to contact our office to arrange for a follow-up visit, and she verbalized understanding.  I will route this recommendation to the requesting party via Epic fax function and remove from pre-op pool.  Please call with questions.  Lamarr Satterfield, NP 03/16/2024, 8:15 AM

## 2024-03-16 NOTE — Telephone Encounter (Signed)
 I reviewed the notes about blood thinner Xarelto, however will send to preop APP to review if the pt has been cleared as well.

## 2024-03-16 NOTE — Telephone Encounter (Signed)
 It appears that the pharmacy provided information on holding Eliquis  but she is on Xarelto. She is to restart Xarelto the following day after procedure at the discretion of the requesting physician.  Procedure:  Translam injection   Date of Surgery:  Clearance 03/20/24       CHA2DS2-VASc Score = 7   This indicates a 11.2% annual risk of stroke. The patient's score is based upon: CHF History: 0 HTN History: 1 Diabetes History: 1 Stroke History: 2 Vascular Disease History: 0 Age Score: 2 Gender Score: 1   Per chart has history of remote TIA, has held anticoagulation for 3 days previously   CrCl 49 Platelet count 168   Patient has not had an Afib/aflutter ablation within the last 3 months or DCCV within the last 30 days   Per office protocol, patient can hold Eliquis  for 3 days prior to procedure.   Patient will not need bridging with Lovenox (enoxaparin) around procedure.

## 2024-03-20 DIAGNOSIS — M5116 Intervertebral disc disorders with radiculopathy, lumbar region: Secondary | ICD-10-CM | POA: Diagnosis not present

## 2024-03-20 DIAGNOSIS — M5416 Radiculopathy, lumbar region: Secondary | ICD-10-CM | POA: Diagnosis not present

## 2024-03-27 DIAGNOSIS — E1165 Type 2 diabetes mellitus with hyperglycemia: Secondary | ICD-10-CM | POA: Diagnosis not present

## 2024-03-27 DIAGNOSIS — I10 Essential (primary) hypertension: Secondary | ICD-10-CM | POA: Diagnosis not present

## 2024-05-09 DIAGNOSIS — R3915 Urgency of urination: Secondary | ICD-10-CM | POA: Diagnosis not present

## 2024-05-09 DIAGNOSIS — N952 Postmenopausal atrophic vaginitis: Secondary | ICD-10-CM | POA: Diagnosis not present

## 2024-05-09 DIAGNOSIS — R350 Frequency of micturition: Secondary | ICD-10-CM | POA: Diagnosis not present

## 2024-05-09 DIAGNOSIS — I4891 Unspecified atrial fibrillation: Secondary | ICD-10-CM | POA: Diagnosis not present

## 2024-05-14 DIAGNOSIS — J029 Acute pharyngitis, unspecified: Secondary | ICD-10-CM | POA: Diagnosis not present

## 2024-05-14 DIAGNOSIS — I10 Essential (primary) hypertension: Secondary | ICD-10-CM | POA: Diagnosis not present

## 2024-05-14 DIAGNOSIS — J0100 Acute maxillary sinusitis, unspecified: Secondary | ICD-10-CM | POA: Diagnosis not present

## 2024-05-23 ENCOUNTER — Inpatient Hospital Stay: Attending: Hematology and Oncology | Admitting: Hematology and Oncology

## 2024-05-23 VITALS — BP 118/72 | HR 82 | Temp 97.6°F | Resp 18 | Ht 64.0 in | Wt 129.5 lb

## 2024-05-23 DIAGNOSIS — Z79811 Long term (current) use of aromatase inhibitors: Secondary | ICD-10-CM | POA: Insufficient documentation

## 2024-05-23 DIAGNOSIS — D0512 Intraductal carcinoma in situ of left breast: Secondary | ICD-10-CM | POA: Insufficient documentation

## 2024-05-23 DIAGNOSIS — M810 Age-related osteoporosis without current pathological fracture: Secondary | ICD-10-CM | POA: Insufficient documentation

## 2024-05-23 DIAGNOSIS — Z780 Asymptomatic menopausal state: Secondary | ICD-10-CM

## 2024-05-23 DIAGNOSIS — Z1231 Encounter for screening mammogram for malignant neoplasm of breast: Secondary | ICD-10-CM | POA: Diagnosis not present

## 2024-05-23 MED ORDER — IBANDRONATE SODIUM 150 MG PO TABS: 150.0000 mg | ORAL_TABLET | ORAL | 3 refills | Status: AC

## 2024-05-23 NOTE — Progress Notes (Signed)
 Patient Care Team: Onita Rush, MD as PCP - General (Internal Medicine) Cindie Ole DASEN, MD as PCP - Electrophysiology (Cardiology) Ethyl Lenis, MD as Consulting Physician (General Surgery) Odean Potts, MD as Consulting Physician (Hematology and Oncology) Izell Domino, MD as Attending Physician (Radiation Oncology) Crawford Morna Pickle, NP as Nurse Practitioner (Hematology and Oncology)  DIAGNOSIS:  Encounter Diagnosis  Name Primary?   Ductal carcinoma in situ (DCIS) of left breast Yes    SUMMARY OF ONCOLOGIC HISTORY: Oncology History  Ductal carcinoma in situ (DCIS) of left breast  02/01/2018 Initial Diagnosis   Left nipple discharge, mammogram showing asymmetry, ultrasound reveals 0.5 cm area of concern at 12 o'clock position 1 cm from the nipple, axilla negative, biopsy revealed intermediate grade DCIS ER 100%, PR 100%, Tis N0 stage 0 clinical stage   02/27/2018 Surgery   Left lumpectomy: DCIS with calcifications, intermediate grade, 0.6 cm, margins negative, ER 100%, PR 100%, Tis N0 stage 0    05/02/2018 - 05/24/2018 Radiation Therapy   Adjuvant radiation therapy   Left Breast / 42.56 Gy in 16 fractions   06/2018 -  Anti-estrogen oral therapy   Anastrozole  daily     CHIEF COMPLIANT: Surveillance of breast cancer  HISTORY OF PRESENT ILLNESS:  History of Present Illness Dorothy Herring is an 83 year old female with osteoporosis who presents for follow-up regarding osteoporosis management.  She completed a course of anastrozole  last year. Her most recent bone density test in March 2024 showed a score at the osteopenia-osteoporosis threshold. She is concerned about fracture risk.  She is currently taking Farxiga, Neurontin , Xarelto, and Crestor. There is no indication of calcium or vitamin D supplementation.  She has a history of ductal carcinoma in situ, treated and classified as stage zero. She completed five years of treatment and has been off medication since  last year. She experiences anxiety about cancer recurrence, influenced by a friend's experience with breast cancer.     ALLERGIES:  has no known allergies.  MEDICATIONS:  Current Outpatient Medications  Medication Sig Dispense Refill   dapagliflozin propanediol (FARXIGA) 10 MG TABS tablet Take 10 mg by mouth daily.     gabapentin  (NEURONTIN ) 300 MG capsule Take 300 mg by mouth daily.      insulin glargine (LANTUS) 100 UNIT/ML injection Inject 24 Units into the skin at bedtime.     Lancets (ONETOUCH DELICA PLUS LANCET30G) MISC USE TO CHECK BLOOD SUGAR TWICE DAILY AS DIRECTED     LUMIGAN 0.01 % SOLN Place 1 drop into both eyes at bedtime.      ONETOUCH VERIO test strip 2 (two) times daily.     rivaroxaban (XARELTO) 20 MG TABS tablet Take 20 mg by mouth daily with supper.     rosuvastatin (CRESTOR) 40 MG tablet Take 40 mg by mouth daily.  0   No current facility-administered medications for this visit.    PHYSICAL EXAMINATION: ECOG PERFORMANCE STATUS: 1 - Symptomatic but completely ambulatory  Vitals:   05/23/24 0844  BP: 118/72  Pulse: 82  Resp: 18  Temp: 97.6 F (36.4 C)  SpO2: 99%   Filed Weights   05/23/24 0844  Weight: 129 lb 8 oz (58.7 kg)    LABORATORY DATA:       Latest Ref Rng & Units 10/11/2023    3:34 PM 09/07/2021   10:35 AM 01/09/2021    9:32 AM  CMP  Glucose 70 - 99 mg/dL  825    BUN 8 - 23 mg/dL  24    Creatinine 0.44 - 1.00 mg/dL 9.09  9.05  9.09   Sodium 135 - 145 mmol/L  139    Potassium 3.5 - 5.1 mmol/L  4.5    Chloride 98 - 111 mmol/L  100    CO2 22 - 32 mmol/L  27    Calcium 8.9 - 10.3 mg/dL  89.4      Lab Results  Component Value Date   WBC 10.2 09/07/2021   HGB 14.3 09/07/2021   HCT 44.0 09/07/2021   MCV 86.3 09/07/2021   PLT 192 09/07/2021   NEUTROABS 4.3 02/08/2018    ASSESSMENT & PLAN:  Ductal carcinoma in situ (DCIS) of left breast 02/27/2018:Left lumpectomy: DCIS with calcifications, intermediate grade, 0.6 cm, margins  negative, ER 100%, PR 100%, Tis N0 stage 0   Treatment plan :  1.  Adjuvant radiation therapy  05/02/2018-05/24/18 2. followed by adjuvant antiestrogen therapy with Anastrozole  1 mg daily daily x5 years started June 20, 2018.   Letrozole  toxicities: Continues to have hot flashes that are mild to moderate. But she had those even before starting anastrozole . Mild joint stiffness   She completes 5 years of therapy by September 2024.  She will discontinue anastrozole  at that time.   Breast cancer surveillance: 1.  Breast exam 05/23/2024: Benign 2.  Mammogram at [facility] 05/12/2022: Benign density cat B   Bone density 12/07/2023: T-score -2.5 (08/22/2018: T score -1.2): Osteoporosis Recommended bisphosphonate therapy and I sent a prescription for Boniva  once a month. I ordered a bone density test to be done at [facility] in March 2026. If there is further worsening of bone density we will consider switching her to either Prolia  or Zometa.  Patient wishes to follow-up with us  once a year. Assessment & Plan Osteoporosis Osteoporosis confirmed by bone density test. Discussed fracture risk and bone-strengthening medication options, including side effects. Emphasized calcium and vitamin D supplementation. Chose Fosamax for convenience and cost-effectiveness. - Prescribe Fosamax 70 mg once a week. - Start calcium and vitamin D supplementation twice a day. - Order bone density test for March 2026.  Ductal carcinoma in situ (DCIS) of left breast DCIS stage 0, previously treated. Explained DCIS does not progress to stage 4 or require further treatment post-anastrozole . Addressed recurrence concerns. - Continue annual mammograms.      No orders of the defined types were placed in this encounter.  The patient has a good understanding of the overall plan. she agrees with it. she will call with any problems that may develop before the next visit here. Total time spent: 30 mins including face to face time and  time spent for planning, charting and co-ordination of care   Viinay K Imani Fiebelkorn, MD 05/23/24

## 2024-05-23 NOTE — Assessment & Plan Note (Signed)
 02/27/2018:Left lumpectomy: DCIS with calcifications, intermediate grade, 0.6 cm, margins negative, ER 100%, PR 100%, Tis N0 stage 0   Treatment plan :  1.  Adjuvant radiation therapy  05/02/2018-05/24/18 2. followed by adjuvant antiestrogen therapy with Anastrozole  1 mg daily daily x5 years started June 20, 2018.   Letrozole  toxicities: Continues to have hot flashes that are mild to moderate. But she had those even before starting anastrozole . Mild joint stiffness   She completes 5 years of therapy by September 2024.  She will discontinue anastrozole  at that time.   Breast cancer surveillance: 1.  Breast exam 05/23/2024: Benign 2.  Mammogram at [facility] 05/12/2022: Benign density cat B   Bone density 12/07/2023: T-score -2.5 (08/22/2018: T score -1.2): Osteoporosis

## 2024-05-25 ENCOUNTER — Encounter: Payer: Self-pay | Admitting: Hematology and Oncology

## 2024-05-28 ENCOUNTER — Encounter: Payer: Self-pay | Admitting: Hematology and Oncology

## 2024-06-05 DIAGNOSIS — H401131 Primary open-angle glaucoma, bilateral, mild stage: Secondary | ICD-10-CM | POA: Diagnosis not present

## 2024-06-05 DIAGNOSIS — H26492 Other secondary cataract, left eye: Secondary | ICD-10-CM | POA: Diagnosis not present

## 2024-06-05 DIAGNOSIS — E113493 Type 2 diabetes mellitus with severe nonproliferative diabetic retinopathy without macular edema, bilateral: Secondary | ICD-10-CM | POA: Diagnosis not present

## 2024-06-19 DIAGNOSIS — K080 Exfoliation of teeth due to systemic causes: Secondary | ICD-10-CM | POA: Diagnosis not present

## 2024-06-20 DIAGNOSIS — M25511 Pain in right shoulder: Secondary | ICD-10-CM | POA: Diagnosis not present

## 2024-06-20 DIAGNOSIS — M542 Cervicalgia: Secondary | ICD-10-CM | POA: Diagnosis not present

## 2024-08-07 DIAGNOSIS — Z1331 Encounter for screening for depression: Secondary | ICD-10-CM | POA: Diagnosis not present

## 2024-08-07 DIAGNOSIS — Z1389 Encounter for screening for other disorder: Secondary | ICD-10-CM | POA: Diagnosis not present

## 2024-08-07 DIAGNOSIS — Z0000 Encounter for general adult medical examination without abnormal findings: Secondary | ICD-10-CM | POA: Diagnosis not present

## 2024-08-07 DIAGNOSIS — I10 Essential (primary) hypertension: Secondary | ICD-10-CM | POA: Diagnosis not present

## 2024-08-07 DIAGNOSIS — E1165 Type 2 diabetes mellitus with hyperglycemia: Secondary | ICD-10-CM | POA: Diagnosis not present

## 2024-08-07 DIAGNOSIS — R82998 Other abnormal findings in urine: Secondary | ICD-10-CM | POA: Diagnosis not present

## 2024-08-09 DIAGNOSIS — M5412 Radiculopathy, cervical region: Secondary | ICD-10-CM | POA: Diagnosis not present

## 2024-08-09 DIAGNOSIS — M25511 Pain in right shoulder: Secondary | ICD-10-CM | POA: Diagnosis not present

## 2024-08-22 DIAGNOSIS — H401132 Primary open-angle glaucoma, bilateral, moderate stage: Secondary | ICD-10-CM | POA: Diagnosis not present

## 2025-05-23 ENCOUNTER — Ambulatory Visit: Admitting: Hematology and Oncology
